# Patient Record
Sex: Female | Born: 1945
Health system: Southern US, Community
[De-identification: ages and names within clinical notes are randomized; demographics above are authoritative.]

## PROBLEM LIST (undated history)

## (undated) DIAGNOSIS — M1990 Unspecified osteoarthritis, unspecified site: Secondary | ICD-10-CM

## (undated) DIAGNOSIS — K219 Gastro-esophageal reflux disease without esophagitis: Secondary | ICD-10-CM

## (undated) DIAGNOSIS — E785 Hyperlipidemia, unspecified: Secondary | ICD-10-CM

## (undated) DIAGNOSIS — I10 Essential (primary) hypertension: Secondary | ICD-10-CM

## (undated) HISTORY — PX: REPLACEMENT TOTAL HIP W/  RESURFACING IMPLANTS: SUR1222

## (undated) HISTORY — DX: Hyperlipidemia, unspecified: E78.5

## (undated) HISTORY — DX: Essential (primary) hypertension: I10

## (undated) HISTORY — DX: Gastro-esophageal reflux disease without esophagitis: K21.9

## (undated) HISTORY — DX: Unspecified osteoarthritis, unspecified site: M19.90

---

## 1996-12-27 HISTORY — PX: CHOLECYSTECTOMY: SHX55

## 2001-07-14 ENCOUNTER — Ambulatory Visit (HOSPITAL_COMMUNITY): Admission: RE | Admit: 2001-07-14 | Discharge: 2001-07-14 | Payer: Self-pay | Admitting: Orthopaedic Surgery

## 2001-07-14 ENCOUNTER — Encounter: Payer: Self-pay | Admitting: Orthopaedic Surgery

## 2012-09-08 HISTORY — PX: COLONOSCOPY: SHX174

## 2012-10-30 HISTORY — PX: COLON SURGERY: SHX602

## 2012-11-26 DIAGNOSIS — C20 Malignant neoplasm of rectum: Secondary | ICD-10-CM

## 2012-11-26 HISTORY — DX: Malignant neoplasm of rectum: C20

## 2013-07-04 HISTORY — PX: REPLACEMENT TOTAL KNEE: SUR1224

## 2013-07-31 ENCOUNTER — Inpatient Hospital Stay
Admission: RE | Admit: 2013-07-31 | Discharge: 2013-07-31 | Disposition: A | Discharge status: Home / Self Care | Payer: Self-pay | Source: Ambulatory Visit | Admitting: Radiation Oncology

## 2013-07-31 ENCOUNTER — Ambulatory Visit: Payer: Self-pay | Admitting: Radiation Oncology

## 2013-07-31 ENCOUNTER — Ambulatory Visit: Payer: Self-pay

## 2013-07-31 ENCOUNTER — Other Ambulatory Visit: Payer: Self-pay | Admitting: Radiation Therapy

## 2013-07-31 DIAGNOSIS — C7949 Secondary malignant neoplasm of other parts of nervous system: Secondary | ICD-10-CM

## 2013-07-31 DIAGNOSIS — C7931 Secondary malignant neoplasm of brain: Secondary | ICD-10-CM

## 2013-08-06 ENCOUNTER — Encounter: Payer: Self-pay | Admitting: *Deleted

## 2013-08-06 ENCOUNTER — Telehealth: Payer: Self-pay | Admitting: *Deleted

## 2013-09-03 ENCOUNTER — Telehealth: Payer: Self-pay | Admitting: *Deleted

## 2013-09-03 NOTE — Telephone Encounter (Signed)
Pt states she does not have cancer and is being see at [facility] for knee surgery

## 2013-10-24 HISTORY — PX: COLONOSCOPY: SHX174

## 2014-01-01 ENCOUNTER — Encounter (HOSPITAL_COMMUNITY): Payer: Self-pay

## 2014-12-03 HISTORY — PX: REPLACEMENT TOTAL KNEE: SUR1224

## 2014-12-27 DIAGNOSIS — K5732 Diverticulitis of large intestine without perforation or abscess without bleeding: Secondary | ICD-10-CM

## 2014-12-27 HISTORY — DX: Diverticulitis of large intestine without perforation or abscess without bleeding: K57.32

## 2018-04-26 HISTORY — PX: TOTAL SHOULDER ARTHROPLASTY: SHX126

## 2020-03-06 ENCOUNTER — Encounter: Payer: Self-pay | Admitting: Internal Medicine

## 2020-03-22 ENCOUNTER — Encounter: Payer: Self-pay | Admitting: Gastroenterology

## 2020-03-22 NOTE — Progress Notes (Signed)
Referring Provider: Roderic Scarce, MD Primary Care Physician:  Roderic Scarce, MD Primary Gastroenterologist:  Dr. Gala Romney  Chief Complaint  Patient presents with  . Consult    tcs was last done 5 years ago  . Diarrhea    daily- got covid last year and had severe diarrhea afterwards and has been going on since  . Abdominal Pain    none now    HPI:   Brenda Mclean is a 74 y.o. female presenting today at the request of Roderic Scarce, MD for consult colonoscopy, diarrhea, and abdominal pain.   Today:  Last colonoscopy was about 5 years ago in [place] with Dr. West Carbo. Doesn't remember how often she was supposed to have a colonoscopy. States last colonoscopy was "clear" but she has had polyps. Reports a colon mass in the past and a surgery to remove the mass.  No radiation or chemotherapy.  Surgeon was Jasmine December in [place], [place] at [facility]. About 5-6 years before last TCS. Also reports prior colonoscopy at [facility].   Diarrhea started at time COVID.  Minimal improvement.  Initially was having 7 BMs daily.  Now having about 5 BMs daily. No nocturnal BMs. Stools are watery. No brbpr or melena.  Does not think she was recently on antibiotics. None prior to COVID. No associated abdominal pain.  States when she had Covid, she had mild lower abdominal pain but this has resolved. BMs come quickly after eating. Drinking milk daily, cheese, ice cream, and yogurt regularly. Also eating fried/fatty foods. No gallbladder. Prior to [place], she would have occasional loose. Was in the hospital for 8 days. Drinks well water. No livestock contact. Not taking anything to help with diarrhea. Reports having C. Diff years ago.   No fever, chills, cold or flu like symptoms. Feels she has had some memory trouble since COVID. Feels like this is improving somewhat. No lightheadedness, dizziness. No chest pian or heart palpitations. No shortness of breath or cough. Reports losing about 20 pounds with  COVID-19 but weight has stabilized and reports gaining about 3 pounds back.  No NSAIDs.   GERD is well controlled. No nausea or vomiting. No dysphagia.    Past Medical History:  Diagnosis Date  . Arthritis   . COVID-19 03/2019  . GERD (gastroesophageal reflux disease)   . HLD (hyperlipidemia)   . HTN (hypertension)   . 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 Sigmoid diverticulitis 0762   Uncomplicated    Past Surgical History:  Procedure Laterality Date  . CHOLECYSTECTOMY  1998  . COLON SURGERY  2013   rectal mass removed-per patient   . REPLACEMENT TOTAL HIP W/  RESURFACING IMPLANTS Bilateral   . 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 REPLACEMENT TOTAL KNEE Right 12/03/2014  . TOTAL SHOULDER ARTHROPLASTY  04/26/2018    Current Outpatient Medications  Medication Sig Dispense Refill  . celecoxib (CELEBREX) 200 MG capsule Take 200 mg by mouth daily.    . metoprolol tartrate (LOPRESSOR) 25 MG tablet Take 25 mg by mouth daily.    Marland Kitchen omeprazole (PRILOSEC) 20 MG capsule Take 20 mg by mouth daily.    . simvastatin (ZOCOR) 40 MG tablet Take 40 mg by mouth daily.    . valsartan (DIOVAN) 320 MG tablet Take 320 mg by mouth daily.     No current facility-administered medications for this visit.    Allergies as of 03/24/2020  . (No Known Allergies)    Family History  Problem Relation  Age of Onset  . Colon cancer Neg Hx     Social History   Socioeconomic History  . Marital status: Single    Spouse name: Not on file  . Number of children: Not on file  . Years of education: Not on file  . Highest education level: Not on file  Occupational History  . Not on file  Tobacco Use  . Smoking status: Never Smoker  . Smokeless tobacco: Never Used  Substance and Sexual Activity  . Alcohol use: Never  . Drug use: Never  . Sexual activity: Not on file  Other Topics Concern  . Not on file  Social History Narrative  . Not on file   Social Determinants of Health    Financial Resource Strain:   . Difficulty of Paying Living Expenses:   Food Insecurity:   . Worried About Charity fundraiser in the Last Year:   . Arboriculturist in the Last Year:   Transportation Needs:   . Film/video editor (Medical):   Marland Kitchen Lack of Transportation (Non-Medical):   Physical Activity:   . Days of Exercise per Week:   . Minutes of Exercise per Session:   Stress:   . Feeling of Stress :   Social Connections:   . Frequency of Communication with Friends and Family:   . Frequency of Social Gatherings with Friends and Family:   . Attends Religious Services:   . Active Member of Clubs or Organizations:   . Attends Archivist Meetings:   Marland Kitchen Marital Status:   Intimate Partner Violence:   . Fear of Current or Ex-Partner:   . Emotionally Abused:   Marland Kitchen Physically Abused:   . Sexually Abused:     Review of Systems: Gen: See HPI CV: See HPI Resp: See HPI GI: See HPI GU : Denies urinary burning, urinary frequency, urinary hesitancy MS: Has had 5 joint replacements.  No joint pain. Derm: Denies rash Psych: Denies depression or anxiety Heme: Denies bruising or bleeding  Physical Exam: BP (!) 150/81   Pulse 80   Temp (!) 97.1 F (36.2 C) (Oral)   Ht 5' 1"  (1.549 m)   Wt 175 lb 12.8 oz (79.7 kg)   BMI 33.22 kg/m  General:   Alert and oriented. Pleasant and cooperative. Well-nourished and well-developed.  Head:  Normocephalic and atraumatic. Eyes:  Without icterus, sclera clear and conjunctiva pink.  Ears:  Normal auditory acuity. Lungs:  Clear to auscultation bilaterally. No wheezes, rales, or rhonchi. No distress.  Heart:  S1, S2 present without murmurs appreciated.  Abdomen:  +BS, soft, non-tender and non-distended. No HSM noted. No guarding or rebound. No masses appreciated.  Rectal:  Deferred  Msk:  Symmetrical without gross deformities. Normal posture. Extremities:  Without edema. Neurologic:  Alert and  oriented x4;  grossly normal  neurologically. Skin:  Intact without significant lesions or rashes. Psych: Normal mood and affect.  Labs: 12/06/2019 CMP: Glucose 93, creatinine 0.95, GFR 59 (L), sodium 145 (H), potassium 4.3, chloride 105, calcium 9.5, albumin 4.5, total bilirubin 0.5, alk phos 94, AST 26, ALT 20 Lipid panel: Total cholesterol 206 (H), triglycerides 178 (H), HDL 69, LDL 107 (H)  08/15/2019 TSH 3.570 Free T4 0.92 CBC: WBC 6.8, hemoglobin 15.1, MCV 91, MCH 29.8, MCHC 32.7, platelets 260 CMP: Glucose 87, creatinine 0.97, GFR 57 (L), sodium 142, potassium 4.7, chloride 103, calcium 10, albumin 4.7, total bilirubin 0.5, alk phos 79, AST 28, ALT 21 Lipid panel: Total  cholesterol 181, triglycerides 155 (H), HDL 72, LDL 78

## 2020-03-24 ENCOUNTER — Ambulatory Visit (INDEPENDENT_AMBULATORY_CARE_PROVIDER_SITE_OTHER): Payer: Medicare Other | Admitting: Gastroenterology

## 2020-03-24 ENCOUNTER — Other Ambulatory Visit: Payer: Self-pay

## 2020-03-24 ENCOUNTER — Encounter: Payer: Self-pay | Admitting: Gastroenterology

## 2020-03-24 VITALS — BP 150/81 | HR 80 | Temp 97.1°F | Ht 61.0 in | Wt 175.8 lb

## 2020-03-24 DIAGNOSIS — Z860100 Personal history of colon polyps, unspecified: Secondary | ICD-10-CM

## 2020-03-24 DIAGNOSIS — R197 Diarrhea, unspecified: Secondary | ICD-10-CM | POA: Diagnosis not present

## 2020-03-24 DIAGNOSIS — Z85048 Personal history of other malignant neoplasm of rectum, rectosigmoid junction, and anus: Secondary | ICD-10-CM | POA: Diagnosis not present

## 2020-03-24 NOTE — Assessment & Plan Note (Addendum)
74 year old female with history significant for rectal adenocarcinoma s/p resection in 2013 (without chemotherapy or radiation per patient), colon polyps, last colonoscopy per patient about 5 years ago in [place] with Dr. West Carbo presenting to discuss scheduling her surveillance colonoscopy and watery diarrhea that started at the time of COVID-19 diagnosis in April 2020.  Patient was admitted to the hospital for 8 days with Covid.  Since then, she has had resolution of all respiratory symptoms but has continued with memory trouble and frequent watery diarrhea with very minimal improvement.  Denies abdominal pain, bright red blood per rectum, or melena.  Weight loss when sick with Covid but weight is currently stable. She is not currently taking anything for diarrhea and does note she consumes dairy products regularly.  She is also s/p cholecystectomy and does consume fried/fatty foods at times.  Denies recent antibiotics or contact with livestock.  She does drink well water.  Recent labs in August 2020 with hemoglobin 15.1, TSH 3.57. CMP in December 2020 essentially normal except for slight decrease in GFR (59) with creatinine 0.95.  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  With recent hospitalization and well water consumption, cannot rule out infectious diarrhea.  Suspect diet may also be playing a role.   Complete C. difficile and GI pathogen stool studies.  Advise she follow a lactose-free diet or take Lactaid tablets prior to consumption of any dairy products. Avoid fried/fatty foods.  Request colonoscopy records as well as prior surgical report to determine timing of next colonoscopy. Further recommendations to follow stool study results and review of prior records.

## 2020-03-24 NOTE — Assessment & Plan Note (Signed)
Addressed under history of colonic polyps.  

## 2020-03-24 NOTE — Assessment & Plan Note (Addendum)
74 year old female with history significant for rectal adenocarcinoma s/p resection in 2013 (without chemotherapy or radiation per patient), colon polyps, last colonoscopy per patient about 5 years ago in [place] with Dr. West Carbo. She states that colonoscopy was "clear."  She is currently dealing with persistent watery diarrhea with about 5 stools daily that started at the time of COVID-19 diagnosis in April 2020 only with minimal improvement since then.  Diarrhea as discussed above.  Denies bright red blood per rectum, melena, or abdominal pain.  Reports about 20 pound weight loss with COVID-19 but weight has stabilized and she has gained about 3 pounds back.  No family history of colon cancer.  As it is unclear exactly when patient had her last colonoscopy, I will request her prior colonoscopy and surgical records prior to scheduling.  Further recommendations to follow review of records.  Further evaluation management of diarrhea as per above.  Addendum:  Received and reviewed prior colonoscopy and surgical records.  See telephone note dated 04/01/2020 for complete details.  Patient had 2 cm polyp in the distal rectum surgically removed in November 2013 with pathology revealing superficially invasive moderately differentiated adenocarcinoma arising in adenomatous polyp of the distal rectum.  Follow-up colonoscopy with polyp mid rectum.  No pathology received but recommendations were to repeat colonoscopy in 5 years.  She is currently overdue for colonoscopy and we will get this arranged.  Proceed with TCS with propofol with Dr. Gala Romney in the near future.  We had discussed this at her prior office visit. We will opt to use propofol as propofol has been used on her last 2 colonoscopies. Follow-up after colonoscopy.

## 2020-03-24 NOTE — Patient Instructions (Signed)
Please have stool studies completed for [facility].  Please have the results faxed back to our office.  We will call you with results and further recommendations.  Please follow a lactose-free diet or take Lactaid pills prior to consuming any dairy products.  You should also follow a low-fat diet as you do not have a gallbladder and this may be influencing her diarrhea.  I am requesting your previous colonoscopy and colon surgery records.  I will call you regarding whether or not it is time to update your colonoscopy.  Aliene Altes, PA-C [facility]

## 2020-04-01 ENCOUNTER — Telehealth: Payer: Self-pay | Admitting: Gastroenterology

## 2020-04-01 ENCOUNTER — Encounter: Payer: Self-pay | Admitting: Gastroenterology

## 2020-04-01 LAB — GI PROFILE, STOOL, PCR

## 2020-04-01 LAB — OVA AND PARASITE EXAMINATION

## 2020-04-01 LAB — CLOSTRIDIUM DIFFICILE EIA: C difficile Toxins A+B, EIA: NEGATIVE

## 2020-04-01 NOTE — Telephone Encounter (Signed)
Received and reviewed colonoscopy reports from 2013 and 2014 as well as surgical report for removal of distal rectal polyp in 2013 as detailed below.  -Colonoscopy with propofol 09/08/2012 with Dr. West Carbo for colon cancer screening. Findings: Multilobulated 3 mm mass distal rectum with multiple biopsies taken, scattered diverticulosis in the sigmoid colon, otherwise normal exam. Pathology: Adenomatous polyp  -Transanal resection of rectal polyp and exam under general anesthesia on 10/30/2012 with Dr. Herschell Dimes,  Findings: At the distal rectum, at the right anterolateral rectal wall, just a few centimeters proximal to be dentate line, there was approximately a 2 cm polyp on a slightly more narrow base.  The polyp was soft.  No other abnormality noted on anorectal exam.  The polyp was removed via stapler across the base.  There was still one small attachment which was further resected. Pathology: Superficially invasive moderately differentiated adenocarcinoma arising in adenomatous polyp of distal rectum.  -Colonoscopy with propofol 10/24/2013 for follow-up with Dr. West Carbo.  Findings: Small granuloma and sutures distal rectum, small polyp mid rectum removed, sigmoid diverticulosis, otherwise normal exam.  Recommended follow-up colonoscopy in 5 years unless pathology revealed dysplasia. No pathology received.   I will have these reports scanned into patient's chart. She is due for repeat TCS at this time.   Elmo Putt, please let patient know I have received and reviewed her last colonoscopy report.  Her last colonoscopy was in 2014.  She did have 1 polyp removed and it was recommended for her to have a repeat colonoscopy in 5 years.  We need to proceed with scheduling her colonoscopy as discussed at the time of office visit.   [facility] Clinical Pool:  Please arrange TCS with propofol with RMR. Dx: history of adenomatous colon polyps, history of adenocarcinoma of distal rectum, diarrhea.

## 2020-04-01 NOTE — Progress Notes (Signed)
Stool studies without any evidence of infectious diarrhea. Please follow-up with patient to see how her diarrhea is doing. I had recommended lactose free diet or lactaid pills prior to all dairy and following a low fat diet.   If she continues with diarrhea, recommend taking imodium as needed. May take 2 tablets after first loose stool then 1 after each additional loose stool but no more than 4 tablets in 24 hours. She should call us back in 1 week with an update.   We are waiting on colonoscopy records to determine timing of next colonoscopy.   Manuela Schwartz, have we requested colonoscopy records from Dr. West Carbo  and colon surgery report from [facility] in [place], [place] at [facility]?

## 2020-04-02 ENCOUNTER — Encounter: Payer: Self-pay | Admitting: *Deleted

## 2020-04-02 MED ORDER — NA SULFATE-K SULFATE-MG SULF 17.5-3.13-1.6 GM/177ML PO SOLN: 1.0000 | Freq: Once | ORAL | 0 refills | Status: AC

## 2020-04-02 NOTE — Telephone Encounter (Signed)
Reminder in epic °

## 2020-04-02 NOTE — Telephone Encounter (Signed)
Spoke with patient. She is aware [place] review reports and recs tcs with propofol with RMR. She is agreeable.  Procedure scheduled for 7/15 at 10:00am. Patient aware will mail prep instructions with pre-op/covid test appt, confirmed mailing address. Confirmed pharmacy is [facility] for prep rx to be sent to. She will pick this up.

## 2020-04-02 NOTE — Telephone Encounter (Signed)
NIC Repeat TCS in 5 years

## 2020-04-02 NOTE — Telephone Encounter (Signed)
Noted  

## 2020-04-02 NOTE — Addendum Note (Signed)
Addended by: Cheron Every on: 04/02/2020 08:28 AM   Modules accepted: Orders

## 2020-06-19 ENCOUNTER — Telehealth: Payer: Self-pay | Admitting: *Deleted

## 2020-06-19 MED ORDER — PEG 3350-KCL-NA BICARB-NACL 420 G PO SOLR: ORAL | 0 refills | Status: DC

## 2020-06-19 NOTE — Telephone Encounter (Signed)
Patient called in requesting golytly as she has had this in the past. Advised when she was scheduled it was on back order. She states she just got off the phone with her pharmacy and they do have it now. I advised will send in Rx and mail her new prep instructions. She then stated "I have done this 3 times, I know what I am doing". I advised will mail her prep instructions as this is a slit prep and it will give exact directions on what to do for prep.

## 2020-07-04 NOTE — Patient Instructions (Signed)
Brenda Mclean  07/04/2020     @PREFPERIOPPHARMACY @   Your procedure is scheduled on  07/10/2020   Report to [facility] at [place].M.  Call this number if you have problems the morning of surgery:  [phone]   Remember:  Follow the diet and prep instructions given to you by Dr Roseanne Kaufman office.                      Take these medicines the morning of surgery with A SIP OF WATER  Celebrex,metoprolol, prilosec.    Do not wear jewelry, make-up or nail polish.  Do not wear lotions, powders, or perfumes. Please wear deodorant and brush your teeth.  Do not shave 48 hours prior to surgery.  Men may shave face and neck.  Do not bring valuables to the hospital.  [facility] is not responsible for any belongings or valuables.  Contacts, dentures or bridgework may not be worn into surgery.  Leave your suitcase in the car.  After surgery it may be brought to your room.  For patients admitted to the hospital, discharge time will be determined by your treatment team.  Patients discharged the day of surgery will not be allowed to drive home.   Name and phone number of your driver:   family Special instructions:  DO NOT smoke the morning of your procedure.  Please read over the following fact sheets that you were given. Anesthesia Post-op Instructions and Care and Recovery After Surgery       Colonoscopy, Adult, Care After This sheet gives you information about how to care for yourself after your procedure. Your health care provider may also give you more specific instructions. If you have problems or questions, contact your health care provider. What can I expect after the procedure? After the procedure, it is common to have:  A small amount of blood in your stool for 24 hours after the procedure.  Some gas.  Mild cramping or bloating of your abdomen. Follow these instructions at home: Eating and drinking   Drink enough fluid to keep your urine pale yellow.  Follow  instructions from your health care provider about eating or drinking restrictions.  Resume your normal diet as instructed by your health care provider. Avoid heavy or fried foods that are hard to digest. Activity  Rest as told by your health care provider.  Avoid sitting for a long time without moving. Get up to take short walks every 1-2 hours. This is important to improve blood flow and breathing. Ask for help if you feel weak or unsteady.  Return to your normal activities as told by your health care provider. Ask your health care provider what activities are safe for you. Managing cramping and bloating   Try walking around when you have cramps or feel bloated.  Apply heat to your abdomen as told by your health care provider. Use the heat source that your health care provider recommends, such as a moist heat pack or a heating pad. ? Place a towel between your skin and the heat source. ? Leave the heat on for 20-30 minutes. ? Remove the heat if your skin turns bright red. This is especially important if you are unable to feel pain, heat, or cold. You may have a greater risk of getting burned. General instructions  For the first 24 hours after the procedure: ? Do not drive or use machinery. ? Do not sign  important documents. ? Do not drink alcohol. ? Do your regular daily activities at a slower pace than normal. ? Eat soft foods that are easy to digest.  Take over-the-counter and prescription medicines only as told by your health care provider.  Keep all follow-up visits as told by your health care provider. This is important. Contact a health care provider if:  You have blood in your stool 2-3 days after the procedure. Get help right away if you have:  More than a small spotting of blood in your stool.  Large blood clots in your stool.  Swelling of your abdomen.  Nausea or vomiting.  A fever.  Increasing pain in your abdomen that is not relieved with  medicine. Summary  After the procedure, it is common to have a small amount of blood in your stool. You may also have mild cramping and bloating of your abdomen.  For the first 24 hours after the procedure, do not drive or use machinery, sign important documents, or drink alcohol.  Get help right away if you have a lot of blood in your stool, nausea or vomiting, a fever, or increased pain in your abdomen. This information is not intended to replace advice given to you by your health care provider. Make sure you discuss any questions you have with your health care provider. Document Revised: 07/09/2019 Document Reviewed: 07/09/2019 Elsevier Patient Education  [place] These instructions provide you with information about caring for yourself after your procedure. Your health care provider may also give you more specific instructions. Your treatment has been planned according to current medical practices, but problems sometimes occur. Call your health care provider if you have any problems or questions after your procedure. What can I expect after the procedure? After your procedure, you may:  Feel sleepy for several hours.  Feel clumsy and have poor balance for several hours.  Feel forgetful about what happened after the procedure.  Have poor judgment for several hours.  Feel nauseous or vomit.  Have a sore throat if you had a breathing tube during the procedure. Follow these instructions at home: For at least 24 hours after the procedure:      Have a responsible adult stay with you. It is important to have someone help care for you until you are awake and alert.  Rest as needed.  Do not: ? Participate in activities in which you could fall or become injured. ? Drive. ? Use heavy machinery. ? Drink alcohol. ? Take sleeping pills or medicines that cause drowsiness. ? Make important decisions or sign legal documents. ? Take care  of children on your own. Eating and drinking  Follow the diet that is recommended by your health care provider.  If you vomit, drink water, juice, or soup when you can drink without vomiting.  Make sure you have little or no nausea before eating solid foods. General instructions  Take over-the-counter and prescription medicines only as told by your health care provider.  If you have sleep apnea, surgery and certain medicines can increase your risk for breathing problems. Follow instructions from your health care provider about wearing your sleep device: ? Anytime you are sleeping, including during daytime naps. ? While taking prescription pain medicines, sleeping medicines, or medicines that make you drowsy.  If you smoke, do not smoke without supervision.  Keep all follow-up visits as told by your health care provider. This is important. Contact a health care provider if:  You keep feeling nauseous or you keep vomiting.  You feel light-headed.  You develop a rash.  You have a fever. Get help right away if:  You have trouble breathing. Summary  For several hours after your procedure, you may feel sleepy and have poor judgment.  Have a responsible adult stay with you for at least 24 hours or until you are awake and alert. This information is not intended to replace advice given to you by your health care provider. Make sure you discuss any questions you have with your health care provider. Document Revised: 03/13/2018 Document Reviewed: 04/04/2016 Elsevier Patient Education  [place].

## 2020-07-07 ENCOUNTER — Other Ambulatory Visit (HOSPITAL_COMMUNITY)
Admission: RE | Admit: 2020-07-07 | Discharge: 2020-07-07 | Disposition: A | Discharge status: Home / Self Care | Payer: Medicare Other | Source: Ambulatory Visit | Attending: Internal Medicine | Admitting: Internal Medicine

## 2020-07-07 ENCOUNTER — Encounter (HOSPITAL_COMMUNITY)
Admission: RE | Admit: 2020-07-07 | Discharge: 2020-07-07 | Disposition: A | Discharge status: Home / Self Care | Payer: Medicare Other | Source: Ambulatory Visit | Attending: Internal Medicine | Admitting: Internal Medicine

## 2020-07-07 ENCOUNTER — Other Ambulatory Visit: Payer: Self-pay

## 2020-07-07 ENCOUNTER — Encounter (HOSPITAL_COMMUNITY): Payer: Self-pay

## 2020-07-07 DIAGNOSIS — Z01812 Encounter for preprocedural laboratory examination: Secondary | ICD-10-CM | POA: Insufficient documentation

## 2020-07-07 DIAGNOSIS — Z20822 Contact with and (suspected) exposure to covid-19: Secondary | ICD-10-CM | POA: Insufficient documentation

## 2020-07-08 LAB — SARS CORONAVIRUS 2 (TAT 6-24 HRS): SARS Coronavirus 2: NEGATIVE

## 2020-07-10 ENCOUNTER — Ambulatory Visit (HOSPITAL_COMMUNITY): Payer: Medicare Other | Admitting: Anesthesiology

## 2020-07-10 ENCOUNTER — Encounter (HOSPITAL_COMMUNITY): Payer: Self-pay | Admitting: Internal Medicine

## 2020-07-10 ENCOUNTER — Encounter (HOSPITAL_COMMUNITY)
Admission: RE | Disposition: A | Discharge status: Home / Self Care | Payer: Self-pay | Source: Home / Self Care | Attending: Internal Medicine

## 2020-07-10 ENCOUNTER — Other Ambulatory Visit: Payer: Self-pay

## 2020-07-10 ENCOUNTER — Ambulatory Visit (HOSPITAL_COMMUNITY)
Admission: RE | Admit: 2020-07-10 | Discharge: 2020-07-10 | Disposition: A | Discharge status: Home / Self Care | Payer: Medicare Other | Attending: Internal Medicine | Admitting: Internal Medicine

## 2020-07-10 DIAGNOSIS — Z8616 Personal history of COVID-19: Secondary | ICD-10-CM | POA: Insufficient documentation

## 2020-07-10 DIAGNOSIS — Z96643 Presence of artificial hip joint, bilateral: Secondary | ICD-10-CM | POA: Diagnosis not present

## 2020-07-10 DIAGNOSIS — I10 Essential (primary) hypertension: Secondary | ICD-10-CM | POA: Insufficient documentation

## 2020-07-10 DIAGNOSIS — C20 Malignant neoplasm of rectum: Secondary | ICD-10-CM | POA: Diagnosis not present

## 2020-07-10 DIAGNOSIS — Z86010 Personal history of colonic polyps: Secondary | ICD-10-CM | POA: Insufficient documentation

## 2020-07-10 DIAGNOSIS — D123 Benign neoplasm of transverse colon: Secondary | ICD-10-CM | POA: Diagnosis not present

## 2020-07-10 DIAGNOSIS — Z85048 Personal history of other malignant neoplasm of rectum, rectosigmoid junction, and anus: Secondary | ICD-10-CM | POA: Insufficient documentation

## 2020-07-10 DIAGNOSIS — K5730 Diverticulosis of large intestine without perforation or abscess without bleeding: Secondary | ICD-10-CM | POA: Diagnosis not present

## 2020-07-10 DIAGNOSIS — Z79899 Other long term (current) drug therapy: Secondary | ICD-10-CM | POA: Insufficient documentation

## 2020-07-10 DIAGNOSIS — Z96653 Presence of artificial knee joint, bilateral: Secondary | ICD-10-CM | POA: Diagnosis not present

## 2020-07-10 DIAGNOSIS — K219 Gastro-esophageal reflux disease without esophagitis: Secondary | ICD-10-CM | POA: Insufficient documentation

## 2020-07-10 DIAGNOSIS — K635 Polyp of colon: Secondary | ICD-10-CM

## 2020-07-10 DIAGNOSIS — K529 Noninfective gastroenteritis and colitis, unspecified: Secondary | ICD-10-CM | POA: Diagnosis not present

## 2020-07-10 DIAGNOSIS — Z791 Long term (current) use of non-steroidal anti-inflammatories (NSAID): Secondary | ICD-10-CM | POA: Diagnosis not present

## 2020-07-10 DIAGNOSIS — Z96619 Presence of unspecified artificial shoulder joint: Secondary | ICD-10-CM | POA: Diagnosis not present

## 2020-07-10 HISTORY — PX: BIOPSY: SHX5522

## 2020-07-10 HISTORY — PX: POLYPECTOMY: SHX5525

## 2020-07-10 HISTORY — PX: COLONOSCOPY WITH PROPOFOL: SHX5780

## 2020-07-10 SURGERY — COLONOSCOPY WITH PROPOFOL
Anesthesia: General

## 2020-07-10 MED ORDER — CHLORHEXIDINE GLUCONATE CLOTH 2 % EX PADS: 6.0000 | MEDICATED_PAD | Freq: Once | CUTANEOUS | Status: DC

## 2020-07-10 MED ORDER — LACTATED RINGERS IV SOLN: INTRAVENOUS | Status: DC

## 2020-07-10 MED ORDER — PROPOFOL 500 MG/50ML IV EMUL
INTRAVENOUS | Status: DC | PRN
  Administered 2020-07-10: 150 ug/kg/min via INTRAVENOUS

## 2020-07-10 MED ORDER — PROPOFOL 10 MG/ML IV BOLUS
INTRAVENOUS | Status: AC
  Filled 2020-07-10: qty 40

## 2020-07-10 MED ORDER — LACTATED RINGERS IV SOLN: INTRAVENOUS | Status: DC | PRN

## 2020-07-10 NOTE — Op Note (Addendum)
[facility] Patient Name: Brenda Mclean Procedure Date: 07/10/2020 9:00 AM MRN: [id number] Date of Birth: September 02, 1946 Attending MD: Norvel Richards , MD CSN: [id number] Age: 74 Admit Type: Outpatient Procedure:                Colonoscopy Indications:              High risk colon cancer surveillance: Personal                            history of colonic polyps Providers:                Norvel Richards, MD, Tammy Vaught, RN, Crystal                            Page, Randa Spike, Technician Referring MD:              Medicines:                Propofol per Anesthesia Complications:            No immediate complications. Estimated Blood Loss:     Estimated blood loss was minimal. Procedure:                Pre-Anesthesia Assessment:                           - Prior to the procedure, a History and Physical                            was performed, and patient medications and                            allergies were reviewed. The patient's tolerance of                            previous anesthesia was also reviewed. The risks                            and benefits of the procedure and the sedation                            options and risks were discussed with the patient.                            All questions were answered, and informed consent                            was obtained. Prior Anticoagulants: The patient has                            taken no previous anticoagulant or antiplatelet                            agents. ASA Grade Assessment: II - A patient with  mild systemic disease. After reviewing the risks                            and benefits, the patient was deemed in                            satisfactory condition to undergo the procedure.                           After obtaining informed consent, the colonoscope                            was passed under direct vision. Throughout the                             procedure, the patient's blood pressure, pulse, and                            oxygen saturations were monitored continuously. The                            CF-[id number] ([id number]) scope was introduced through                            the anus and advanced to the the cecum, identified                            by appendiceal orifice and ileocecal valve. The                            colonoscopy was performed without difficulty. The                            patient tolerated the procedure well. The quality                            of the bowel preparation was adequate. The entire                            colon was well visualized. Scope In: 10:08:01 AM Scope Out: 10:30:43 AM Scope Withdrawal Time: 0 hours 14 minutes 29 seconds  Total Procedure Duration: 0 hours 22 minutes 42 seconds  Findings:      The perianal and digital rectal examinations revealed a rounded freely       movable depressed lesion at the very tip of the examiners index finger,       2 to 3 cm in dimensions..      Scattered small and large-mouthed diverticula were found in the entire       colon.      A 5 mm polyp was found in the colon. The polyp was sessile. The polyp       was removed with a cold snare. Resection and retrieval were complete.       Estimated blood loss was minimal. Segmental biopsies of the right and  left colon were taken for histologic study to evaluate chronic diarrhea      In the rectum, at approximately 8 cm from the anal verge, there was a       2.5 cm centrally depressed, ulcerated lesion with a heaped up circular       margin. Please see photos. Distally, just above the anal verge, there       was a suture line consistent with history of prior transanal       polypectomy. The lesion at 8 cm was biopsied for histologic study. Impression:               - Diverticulosis in the entire examined colon.                           - One 5 mm polyp, removed with a cold snare.                             Resected and retrieved. 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 Status post                            biopsy.                           Evidence of prior distal rectal surgery as outlined                            above. S/P post colon segmental biopsy Moderate Sedation:      Moderate (conscious) sedation was personally administered by an       anesthesia professional. The following parameters were monitored: oxygen       saturation, heart rate, blood pressure, respiratory rate, EKG, adequacy       of pulmonary ventilation, and response to care. Recommendation:           - Advance diet as tolerated. Procedure Code(s):        --- Professional ---                           [phone], Colonoscopy, flexible; with removal of                            tumor(s), polyp(s), or other lesion(s) by snare                            technique Diagnosis Code(s):        --- Professional ---                           Z86.010, Personal history of colonic polyps                           K63.5, Polyp of colon  K57.30, Diverticulosis of large intestine without                            perforation or abscess without bleeding CPT copyright 2019 American Medical Association. All rights reserved. The codes documented in this report are preliminary and upon coder review may  be revised to meet current compliance requirements. Cristopher Estimable. Emmaline Wahba, MD Norvel Richards, MD 07/10/2020 10:46:43 AM This report has been signed electronically. Number of Addenda: 0

## 2020-07-10 NOTE — H&P (Signed)
@LOGO @   Primary Care Physician:  Roderic Scarce, MD Primary Gastroenterologist:  Dr. Gala Romney  Pre-Procedure History & Physical: HPI:  Brenda Mclean is a 74 y.o. 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  Simple adenoma 2013.  Chronic diarrhea persist.  Stool studies negative.  Colonoscopy now being done for the above-mentioned reasons.  Past Medical History:  Diagnosis Date  . Arthritis   . COVID-19 03/2019  . GERD (gastroesophageal reflux disease)   . HLD (hyperlipidemia)   . HTN (hypertension)   . 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 Sigmoid diverticulitis 4580   Uncomplicated    Past Surgical History:  Procedure Laterality Date  . CHOLECYSTECTOMY  1998  . COLON SURGERY  10/30/2012   Dr. Audrie Lia; transanal resection of 2 cm rectal polyp; pathology revealed superficially invasive moderately differentiated adenocarcinoma arising in adenomatous polyp of distal rectum.  . COLONOSCOPY  10/24/2013   Dr. West Carbo; with propofol; small granuloma and sutures distal rectum, small polyp mid rectum removed, sigmoid diverticulosis, otherwise normal exam.  No pathology received.  Recommended colonoscopy in 5 years.  . COLONOSCOPY  09/08/2012   Dr. West Carbo; with propofol; multi lobulated friable 3 cm mass distal rectum s/p multiple biopsies, scattered sigmoid diverticulosis, otherwise normal exam.  Pathology with adenomatous polyp.  . REPLACEMENT TOTAL HIP W/  RESURFACING IMPLANTS Bilateral   . 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 REPLACEMENT TOTAL KNEE Right 12/03/2014  . TOTAL SHOULDER ARTHROPLASTY  04/26/2018    Prior to Admission medications   Medication Sig Start Date End Date Taking? Authorizing Provider  celecoxib (CELEBREX) 200 MG capsule Take 200 mg by mouth daily.   Yes [provider]  metoprolol succinate (TOPROL-XL) 25 MG 24 hr tablet  Take 25 mg by mouth daily.   Yes [provider]  Multiple Vitamin (MULTIVITAMIN WITH MINERALS) TABS tablet Take 1 tablet by mouth daily.   Yes [provider]  omeprazole (PRILOSEC) 20 MG capsule Take 20 mg by mouth daily.   Yes [provider]  simvastatin (ZOCOR) 40 MG tablet Take 40 mg by mouth at bedtime.    Yes [provider]  valsartan (DIOVAN) 320 MG tablet Take 320 mg by mouth daily.   Yes [provider]  metoprolol tartrate (LOPRESSOR) 25 MG tablet Take 25 mg by mouth daily. Patient not taking: Reported on 06/24/2020    [provider]  polyethylene glycol-electrolytes (NULYTELY) 420 g solution As directed 06/19/20   Ladene Allocca, Cristopher Estimable, MD    Allergies as of 04/02/2020  . (No Known Allergies)    Family History  Problem Relation Age of Onset  . Colon cancer Neg Hx     Social History   Socioeconomic History  . Marital status: Single    Spouse name: Not on file  . Number of children: Not on file  . Years of education: Not on file  . Highest education level: Not on file  Occupational History  . Not on file  Tobacco Use  . Smoking status: Never Smoker  . Smokeless tobacco: Never Used  Substance and Sexual Activity  . Alcohol use: Never  . Drug use: Never  . Sexual activity: Not on file  Other Topics Concern  . Not on file  Social History Narrative  . Not on file   Social Determinants of Health   Financial Resource Strain:   . Difficulty of Paying Living Expenses:  Food Insecurity:   . Worried About Charity fundraiser in the Last Year:   . Arboriculturist in the Last Year:   Transportation Needs:   . Film/video editor (Medical):   Marland Kitchen Lack of Transportation (Non-Medical):   Physical Activity:   . Days of Exercise per Week:   . Minutes of Exercise per Session:   Stress:   . Feeling of Stress :   Social Connections:   . Frequency of Communication with Friends and Family:   . Frequency of Social  Gatherings with Friends and Family:   . Attends Religious Services:   . Active Member of Clubs or Organizations:   . Attends Archivist Meetings:   Marland Kitchen Marital Status:   Intimate Partner Violence:   . Fear of Current or Ex-Partner:   . Emotionally Abused:   Marland Kitchen Physically Abused:   . Sexually Abused:     Review of Systems: See HPI, otherwise negative ROS  Physical Exam: BP (!) 148/71   Pulse 74   Temp 98.3 F (36.8 C) (Oral)   Resp 15   Ht 5\' 1"  (1.549 m)   Wt 77.1 kg   SpO2 95%   BMI 32.12 kg/m  General:   Alert,  Well-developed, well-nourished, pleasant and cooperative in NAD SNeck:  Supple; no masses or thyromegaly. No significant cervical adenopathy. Lungs:  Clear throughout to auscultation.   No wheezes, crackles, or rhonchi. No acute distress. Heart:  Regular rate and rhythm; no murmurs, clicks, rubs,  or gallops. Abdomen: Non-distended, normal bowel sounds.  Soft and nontender without appreciable mass or hepatosplenomegaly.  Pulses:  Normal pulses noted. Extremities:  Without clubbing or edema.  Impression/Plan: 74 year old lady here for surveillance colonoscopy.  Also chronic diarrhea. The risks, benefits, limitations, alternatives and imponderables have been reviewed with the patient. Questions have been answered. All parties are agreeable.      Notice: This dictation was prepared with Dragon dictation along with smaller phrase technology. Any transcriptional errors that result from this process are unintentional and may not be corrected upon review.

## 2020-07-10 NOTE — Anesthesia Postprocedure Evaluation (Signed)
Anesthesia Post Note  Patient: Brenda Mclean  Procedure(s) Performed: COLONOSCOPY WITH PROPOFOL (N/A ) BIOPSY POLYPECTOMY  Patient location during evaluation: PACU Anesthesia Type: General Level of consciousness: awake, oriented and patient cooperative Pain management: pain level controlled Vital Signs Assessment: post-procedure vital signs reviewed and stable Respiratory status: spontaneous breathing, respiratory function stable and nonlabored ventilation Cardiovascular status: stable Postop Assessment: no apparent nausea or vomiting Anesthetic complications: no   No complications documented.   Last Vitals:  Vitals:   07/10/20 0854  BP: (!) 148/71  Pulse: 74  Resp: 15  Temp: 36.8 C  SpO2: 95%    Last Pain:  Vitals:   07/10/20 1003  TempSrc:   PainSc: 0-No pain                 Pietra Zuluaga

## 2020-07-10 NOTE — Anesthesia Preprocedure Evaluation (Signed)
Anesthesia Evaluation  Patient identified by MRN, date of birth, ID band Patient awake    Reviewed: Allergy & Precautions, H&P , NPO status , Patient's Chart, lab work & pertinent test results, reviewed documented beta blocker date and time   Airway Mallampati: II  TM Distance: >3 FB Neck ROM: full    Dental no notable dental hx. (+) Teeth Intact   Pulmonary neg pulmonary ROS,    Pulmonary exam normal breath sounds clear to auscultation       Cardiovascular Exercise Tolerance: Good hypertension, negative cardio ROS   Rhythm:regular Rate:Normal     Neuro/Psych negative neurological ROS  negative psych ROS   GI/Hepatic Neg liver ROS, GERD  Medicated,  Endo/Other  negative endocrine ROS  Renal/GU negative Renal ROS  negative genitourinary   Musculoskeletal   Abdominal   Peds  Hematology negative hematology ROS (+)   Anesthesia Other Findings   Reproductive/Obstetrics negative OB ROS                             Anesthesia Physical Anesthesia Plan  ASA: II  Anesthesia Plan: General   Post-op Pain Management:    Induction:   PONV Risk Score and Plan: 3 and Propofol infusion  Airway Management Planned:   Additional Equipment:   Intra-op Plan:   Post-operative Plan:   Informed Consent: I have reviewed the patients History and Physical, chart, labs and discussed the procedure including the risks, benefits and alternatives for the proposed anesthesia with the patient or authorized representative who has indicated his/her understanding and acceptance.     Dental Advisory Given  Plan Discussed with: CRNA  Anesthesia Plan Comments:         Anesthesia Quick Evaluation

## 2020-07-10 NOTE — Discharge Instructions (Signed)
Colon Polyps  Polyps are tissue growths inside the body. Polyps can grow in many places, including the large intestine (colon). A polyp may be a round bump or a mushroom-shaped growth. You could have one polyp or several. Most colon polyps are noncancerous (benign). However, some colon polyps can become cancerous over time. Finding and removing the polyps early can help prevent this. What are the causes? The exact cause of colon polyps is not known. What increases the risk? You are more likely to develop this condition if you:  Have a family history of colon cancer or colon polyps.  Are older than 21 or older than 45 if you are African American.  Have inflammatory bowel disease, such as ulcerative colitis or Crohn's disease.  Have certain hereditary conditions, such as: ? Familial adenomatous polyposis. ? Lynch syndrome. ? Turcot syndrome. ? Peutz-Jeghers syndrome.  Are overweight.  Smoke cigarettes.  Do not get enough exercise.  Drink too much alcohol.  Eat a diet that is high in fat and red meat and low in fiber.  Had childhood cancer that was treated with abdominal radiation. What are the signs or symptoms? Most polyps do not cause symptoms. If you have symptoms, they may include:  Blood coming from your rectum when having a bowel movement.  Blood in your stool. The stool may look dark red or black.  Abdominal pain.  A change in bowel habits, such as constipation or diarrhea. How is this diagnosed? This condition is diagnosed with a colonoscopy. This is a procedure in which a lighted, flexible scope is inserted into the anus and then passed into the colon to examine the area. Polyps are sometimes found when a colonoscopy is done as part of routine cancer screening tests. How is this treated? Treatment for this condition involves removing any polyps that are found. Most polyps can be removed during a colonoscopy. Those polyps will then be tested for cancer.  Additional treatment may be needed depending on the results of testing. Follow these instructions at home: Lifestyle  Maintain a healthy weight, or lose weight if recommended by your health care provider.  Exercise every day or as told by your health care provider.  Do not use any products that contain nicotine or tobacco, such as cigarettes and e-cigarettes. If you need help quitting, ask your health care provider.  If you drink alcohol, limit how much you have: ? 0-1 drink a day for women. ? 0-2 drinks a day for men.  Be aware of how much alcohol is in your drink. In the [place] one drink equals one 12 oz bottle of beer (355 mL), one 5 oz glass of wine (148 mL), or one 1 oz shot of hard liquor (44 mL). Eating and drinking   Eat foods that are high in fiber, such as fruits, vegetables, and whole grains.  Eat foods that are high in calcium and vitamin D, such as milk, cheese, yogurt, eggs, liver, fish, and broccoli.  Limit foods that are high in fat, such as fried foods and desserts.  Limit the amount of red meat and processed meat you eat, such as hot dogs, sausage, bacon, and lunch meats. General instructions  Keep all follow-up visits as told by your health care provider. This is important. ? This includes having regularly scheduled colonoscopies. ? Talk to your health care provider about when you need a colonoscopy. Contact a health care provider if:  You have new or worsening bleeding during a bowel movement.  You  have new or increased blood in your stool.  You have a change in bowel habits.  You lose weight for no known reason. Summary  Polyps are tissue growths inside the body. Polyps can grow in many places, including the colon.  Most colon polyps are noncancerous (benign), but some can become cancerous over time.  This condition is diagnosed with a colonoscopy.  Treatment for this condition involves removing any polyps that are found. Most polyps can be removed  during a colonoscopy. This information is not intended to replace advice given to you by your health care provider. Make sure you discuss any questions you have with your health care provider. Document Revised: 03/30/2018 Document Reviewed: 03/30/2018 Elsevier Patient Education  [place] After These instructions provide you with information about caring for yourself after your procedure. Your health care provider may also give you more specific instructions. Your treatment has been planned according to current medical practices, but problems sometimes occur. Call your health care provider if you have any problems or questions after your procedure. What can I expect after the procedure? After your procedure, you may:  Feel sleepy for several hours.  Feel clumsy and have poor balance for several hours.  Feel forgetful about what happened after the procedure.  Have poor judgment for several hours.  Feel nauseous or vomit.  Have a sore throat if you had a breathing tube during the procedure. Follow these instructions at home: For at least 24 hours after the procedure:      Have a responsible adult stay with you. It is important to have someone help care for you until you are awake and alert.  Rest as needed.  Do not: ? Participate in activities in which you could fall or become injured. ? Drive. ? Use heavy machinery. ? Drink alcohol. ? Take sleeping pills or medicines that cause drowsiness. ? Make important decisions or sign legal documents. ? Take care of children on your own. Eating and drinking  Follow the diet that is recommended by your health care provider.  If you vomit, drink water, juice, or soup when you can drink without vomiting.  Make sure you have little or no nausea before eating solid foods. General instructions  Take over-the-counter and prescription medicines only as told by your health care provider.  If you  have sleep apnea, surgery and certain medicines can increase your risk for breathing problems. Follow instructions from your health care provider about wearing your sleep device: ? Anytime you are sleeping, including during daytime naps. ? While taking prescription pain medicines, sleeping medicines, or medicines that make you drowsy.  If you smoke, do not smoke without supervision.  Keep all follow-up visits as told by your health care provider. This is important. Contact a health care provider if:  You keep feeling nauseous or you keep vomiting.  You feel light-headed.  You develop a rash.  You have a fever. Get help right away if:  You have trouble breathing. Summary  For several hours after your procedure, you may feel sleepy and have poor judgment.  Have a responsible adult stay with you for at least 24 hours or until you are awake and alert. This information is not intended to replace advice given to you by your health care provider. Make sure you discuss any questions you have with your health care provider. Document Revised: 03/13/2018 Document Reviewed: 04/04/2016 Elsevier Patient Education  [place].  Colonoscopy Discharge Instructions  Read the instructions outlined below and refer to this sheet in the next few weeks. These discharge instructions provide you with general information on caring for yourself after you leave the hospital. Your doctor may also give you specific instructions. While your treatment has been planned according to the most current medical practices available, unavoidable complications occasionally occur. If you have any problems or questions after discharge, call Dr. Gala Romney at [phone]. ACTIVITY  You may resume your regular activity, but move at a slower pace for the next 24 hours.   Take frequent rest periods for the next 24 hours.   Walking will help get rid of the air and reduce the bloated feeling in your belly (abdomen).   No  driving for 24 hours (because of the medicine (anesthesia) used during the test).    Do not sign any important legal documents or operate any machinery for 24 hours (because of the anesthesia used during the test).  NUTRITION  Drink plenty of fluids.   You may resume your normal diet as instructed by your doctor.   Begin with a light meal and progress to your normal diet. Heavy or fried foods are harder to digest and may make you feel sick to your stomach (nauseated).   Avoid alcoholic beverages for 24 hours or as instructed.  MEDICATIONS  You may resume your normal medications unless your doctor tells you otherwise.  WHAT YOU CAN EXPECT TODAY  Some feelings of bloating in the abdomen.   Passage of more gas than usual.   Spotting of blood in your stool or on the toilet paper.  IF YOU HAD POLYPS REMOVED DURING THE COLONOSCOPY:  No aspirin products for 7 days or as instructed.   No alcohol for 7 days or as instructed.   Eat a soft diet for the next 24 hours.  FINDING OUT THE RESULTS OF YOUR TEST Not all test results are available during your visit. If your test results are not back during the visit, make an appointment with your caregiver to find out the results. Do not assume everything is normal if you have not heard from your caregiver or the medical facility. It is important for you to follow up on all of your test results.  SEEK IMMEDIATE MEDICAL ATTENTION IF:  You have more than a spotting of blood in your stool.   Your belly is swollen (abdominal distention).   You are nauseated or vomiting.   You have a temperature over 101.   You have abdominal pain or discomfort that is severe or gets worse throughout the day.   1 small polyp removed from your colon.  Your colon was biopsied to check for the cause of diarrhea.  Abnormal rectal lesion-biopsied -this lesion is going to require an operation to totally remove  Further recommendations to follow pending review of  pathology report  At patient request I called Robin at [phone] -called and left a message for return call.

## 2020-07-10 NOTE — Transfer of Care (Signed)
Immediate Anesthesia Transfer of Care Note  Patient: Brenda Mclean  Procedure(s) Performed: COLONOSCOPY WITH PROPOFOL (N/A ) BIOPSY POLYPECTOMY  Patient Location: PACU  Anesthesia Type:General  Level of Consciousness: awake, alert , oriented and patient cooperative  Airway & Oxygen Therapy: Patient Spontanous Breathing  Post-op Assessment: Report given to RN, Post -op Vital signs reviewed and stable and Patient moving all extremities X 4  Post vital signs: Reviewed and stable  Last Vitals:  Vitals Value Taken Time  BP    Temp    Pulse    Resp    SpO2      Last Pain:  Vitals:   07/10/20 1003  TempSrc:   PainSc: 0-No pain      Patients Stated Pain Goal: 7 (99/23/41 4436)  Complications: No complications documented.

## 2020-07-11 ENCOUNTER — Telehealth: Payer: Self-pay | Admitting: Internal Medicine

## 2020-07-11 NOTE — Telephone Encounter (Signed)
done

## 2020-07-11 NOTE — Telephone Encounter (Signed)
I was called by Dr. Thressa Sheller, the pathologist, today.  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  I called Brenda Mclean at home and informed her she has rectal cancer.  I recommend she see Dr. Leighton Ruff in [place].  She is agreeable.  Lets expedite that referral.  Please send a copy of this telephone encounter along with pathology report to her primary care physician.

## 2020-07-14 ENCOUNTER — Other Ambulatory Visit: Payer: Self-pay

## 2020-07-14 ENCOUNTER — Telehealth: Payer: Self-pay | Admitting: Internal Medicine

## 2020-07-14 DIAGNOSIS — C20 Malignant neoplasm of rectum: Secondary | ICD-10-CM

## 2020-07-14 NOTE — Telephone Encounter (Signed)
Manuela Schwartz- please send copy of this phone encounter along with pathology report to pts PCP.   [facility]- please arrange referral.

## 2020-07-14 NOTE — Telephone Encounter (Signed)
Urgent referral sent to Dr. Leighton Ruff via [facility].

## 2020-07-14 NOTE — Telephone Encounter (Signed)
[phone]  Patient called to find out status if referral to a surgeon in [place]    Please call her back after 3

## 2020-07-14 NOTE — Telephone Encounter (Signed)
Urgent referral was sent today via [facility] to [place]. Called and informed pt. Also gave her phone# to [facility] to call them if she hasn't heard anything in couple days.

## 2020-07-15 ENCOUNTER — Encounter (HOSPITAL_COMMUNITY): Payer: Self-pay | Admitting: Internal Medicine

## 2020-07-15 NOTE — Telephone Encounter (Signed)
Cce'd procedue, path and RMR phone message to PCP

## 2020-07-16 ENCOUNTER — Telehealth: Payer: Self-pay | Admitting: Internal Medicine

## 2020-07-16 ENCOUNTER — Telehealth: Payer: Self-pay

## 2020-07-16 DIAGNOSIS — C20 Malignant neoplasm of rectum: Secondary | ICD-10-CM

## 2020-07-16 NOTE — Telephone Encounter (Signed)
If you spoke with the patient and verified it was her and the patient personally asked for a copy of her procedure and path, it is ok to either leave a copy at the front for her to pick up or mail to her if patient requests it be mailed.

## 2020-07-16 NOTE — Telephone Encounter (Signed)
Called and informed pt [facility] requested CT chest, abd/pelvis prior to her appt. She is agreeable to CT scans.  CT chest, abd/pelvis w/contrast scheduled for 08/05/20 at 12:00pm, arrive at 11:45pm. Have creatinine done prior to appt, pick up contrast prior to appt and NPO 4 hours prior to test.   Called pt back and informed her of CT appt and details. Appt letter mailed with lab order.

## 2020-07-16 NOTE — Telephone Encounter (Signed)
Routing to [facility] in RMR's absence. Please advise if it is ok for pt to have CT chest, abd/pelvis with contrast d/t rectal cancer.

## 2020-07-16 NOTE — Telephone Encounter (Signed)
Pt called and would like a copy of her path/TCS reports so her daughters will know what to look out for when dealing with their health. JL, I don't thing a letter has been completed yet since RMR talked with pt personally. Please advise what I can send pt.

## 2020-07-16 NOTE — Telephone Encounter (Signed)
[facility] called asking to speak with ref. Coordinator or RMR's nurse. MB wasn't available. They have left message with patient that she is scheduled to see Dr Marcello Moores on 8/17 at 0930. They also need a CT of chest, abd, and pelvis ordered prior to appointment. Any questions call [phone]

## 2020-07-16 NOTE — Telephone Encounter (Signed)
Ok to have CT chest, abd/pelvis with contrast. She will need Cr updated prior.

## 2020-07-16 NOTE — Addendum Note (Signed)
Addended by: Hassan Rowan on: 07/16/2020 04:47 PM   Modules accepted: Orders

## 2020-07-17 NOTE — Telephone Encounter (Signed)
Noted. Spoke with pt and she would like pathology mailed to her. Mailed ok per JL.

## 2020-07-21 LAB — CREATININE, SERUM: Creat: 0.97 mg/dL — ABNORMAL HIGH (ref 0.60–0.93)

## 2020-08-05 ENCOUNTER — Other Ambulatory Visit: Payer: Self-pay

## 2020-08-05 ENCOUNTER — Ambulatory Visit (HOSPITAL_COMMUNITY)
Admission: RE | Admit: 2020-08-05 | Discharge: 2020-08-05 | Disposition: A | Discharge status: Home / Self Care | Payer: Medicare Other | Source: Ambulatory Visit | Attending: Gastroenterology | Admitting: Gastroenterology

## 2020-08-05 DIAGNOSIS — C20 Malignant neoplasm of rectum: Secondary | ICD-10-CM | POA: Diagnosis present

## 2020-08-05 IMAGING — CT CT CHEST W/ CM
4 of 5 series · 14 of 36 positions shown, 15 images · IV contrast (omnipaque)
Comparison: None.

CLINICAL DATA: High risk colon cancer surveillance: Personal
history of colonic polyps history of superficially invasive
adenocarcinoma in the rectal polyp removed over 10 years ago
transanally surgery, Chronic diarrhea persist. ?Stool studies
negative, Colonoscopy on [DATE]

EXAM:
CT CHEST, ABDOMEN, AND PELVIS WITH CONTRAST
TECHNIQUE: Multidetector CT imaging of the chest, abdomen and pelvis was
performed following the standard protocol during bolus
administration of intravenous contrast.
CONTRAST:  100mL OMNIPAQUE IOHEXOL 300 MG/ML  SOLN

[Series 2: cap with · axial · 0.78mm/px · z∈[+1129,+1494]mm · 4 of 123 slices shown, 5 images]
[im 25/123  mediastinal]
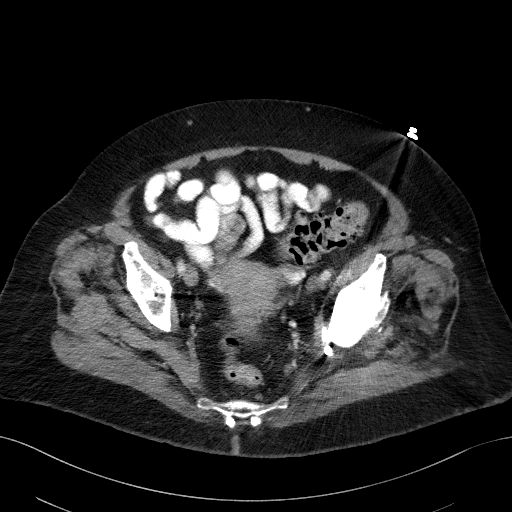
[im 25/123  lung]
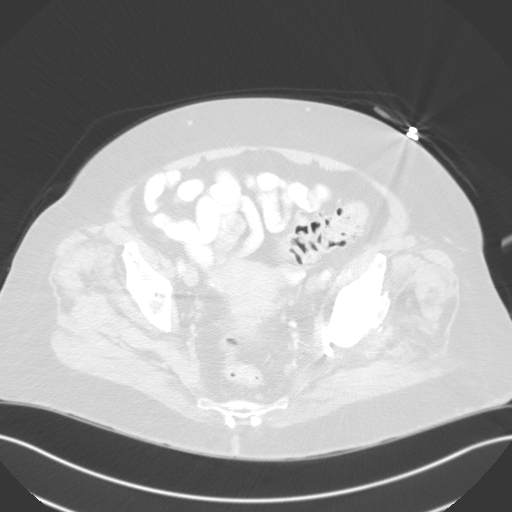
[im 49/123  lung]
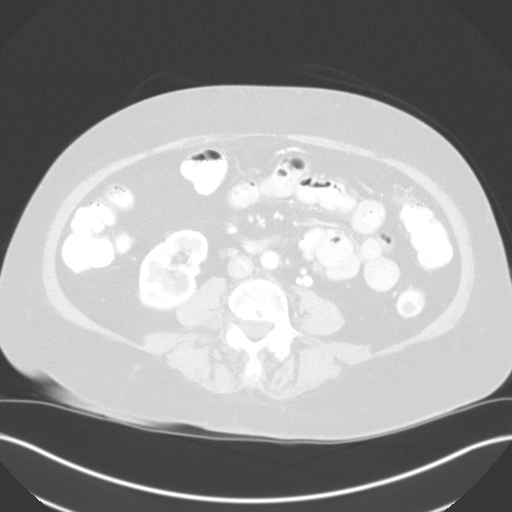
[im 74/123  lung]
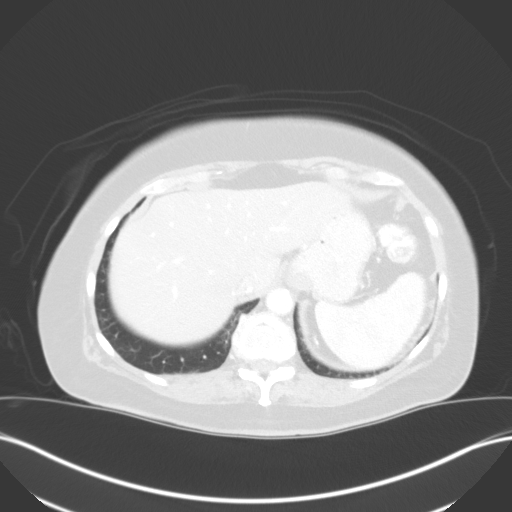
[im 98/123  lung]
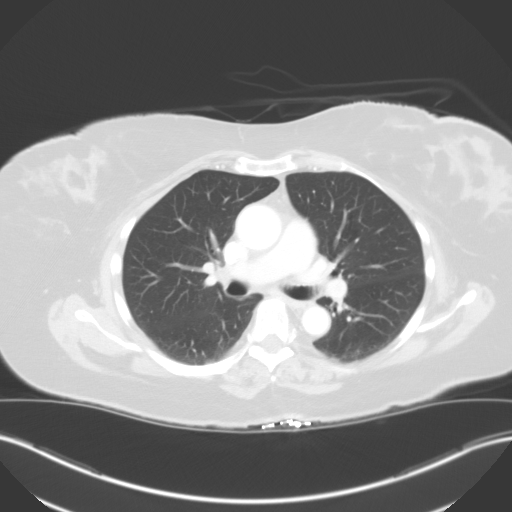

[Series 4: coronals · coronal · 0.63mm/px · 3 of 123 slices shown]
[im 25/123  lung]
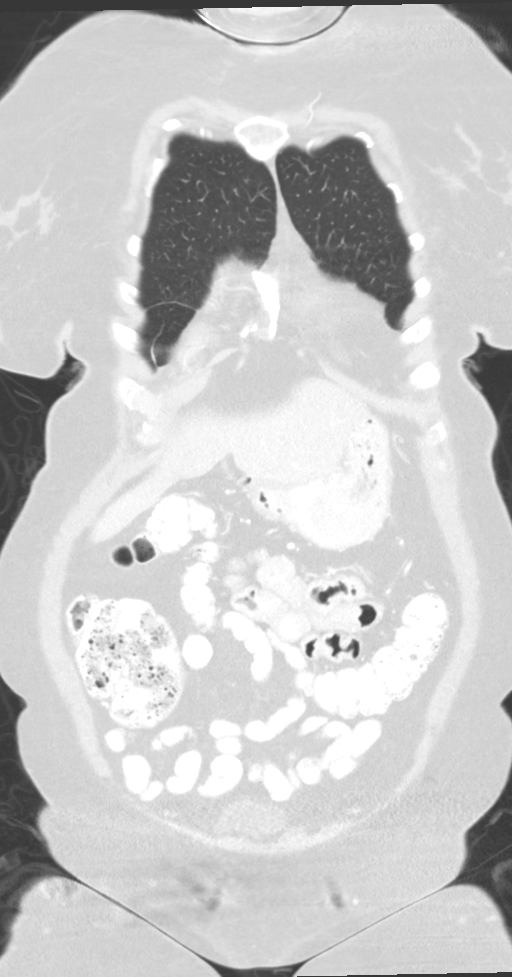
[im 49/123  lung]
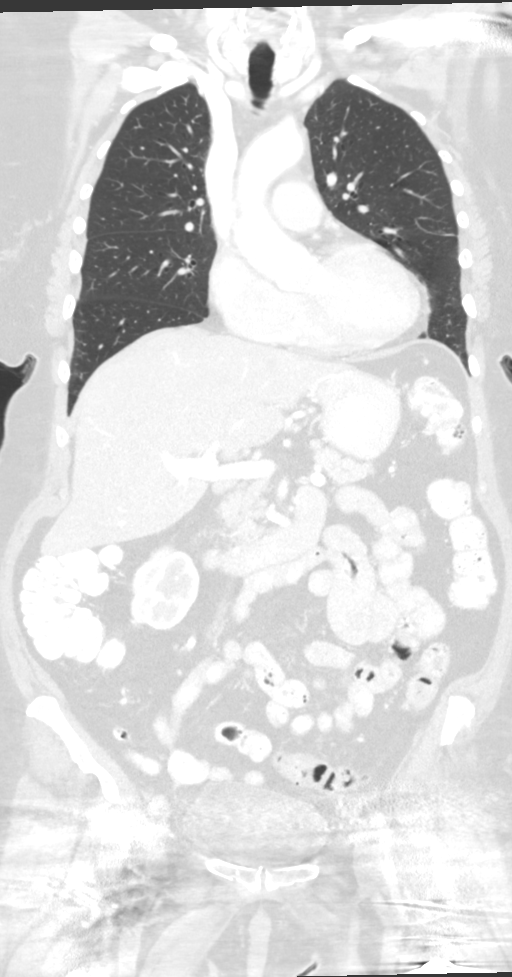
[im 74/123  lung]
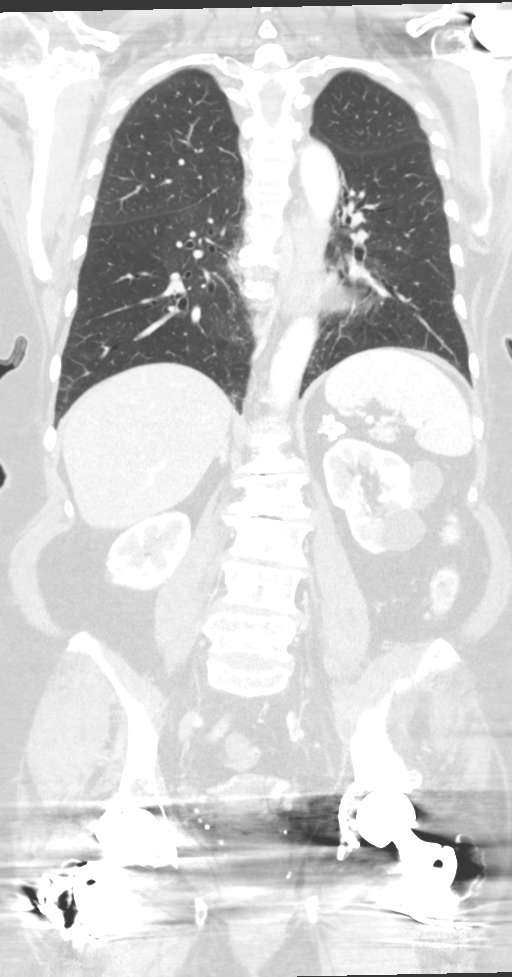

[Series 8: delay · axial · delayed · 0.77mm/px · z∈[+1132,+1252]mm · 2 of 74 slices shown]
[im 25/74  lung]
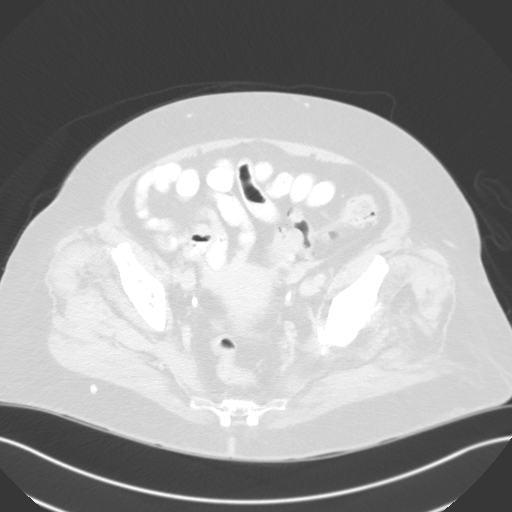
[im 49/74  lung]
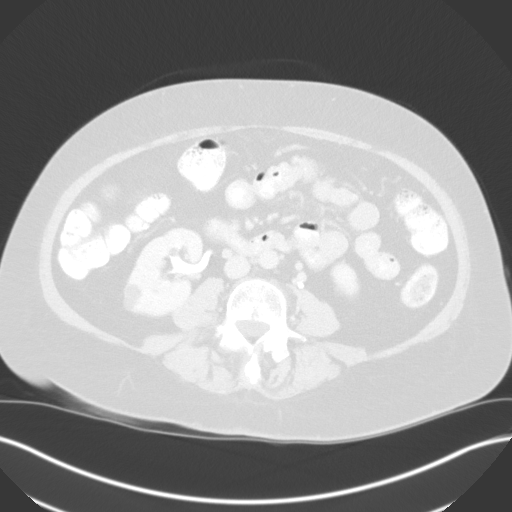

[Series 9: super (person_name) (person_name) · axial · 0.78mm/px · z∈[+1367,+1501]mm · 5 of 356 slices shown]
[im 42/356  lung]
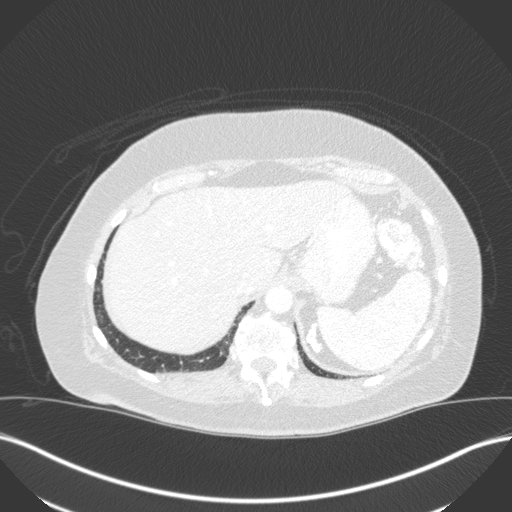
[im 84/356  lung]
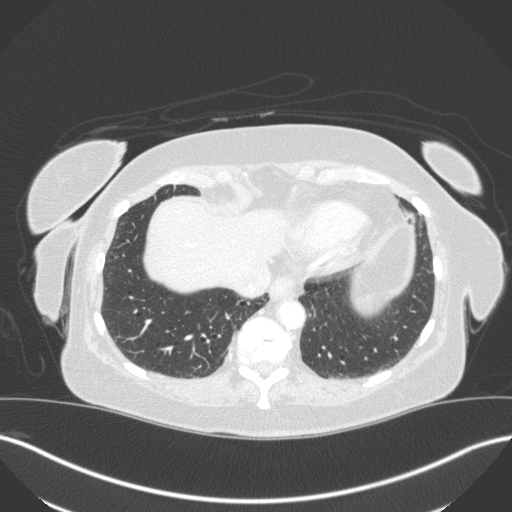
[im 126/356  lung]
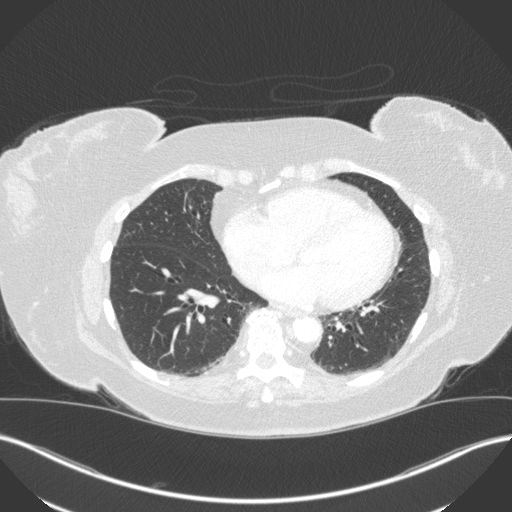
[im 168/356  lung]
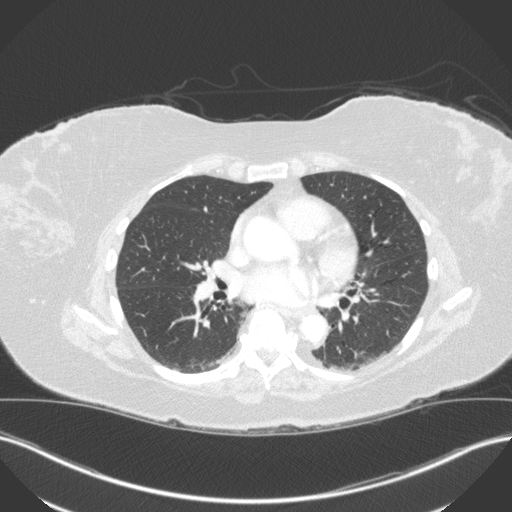
[im 209/356  lung]
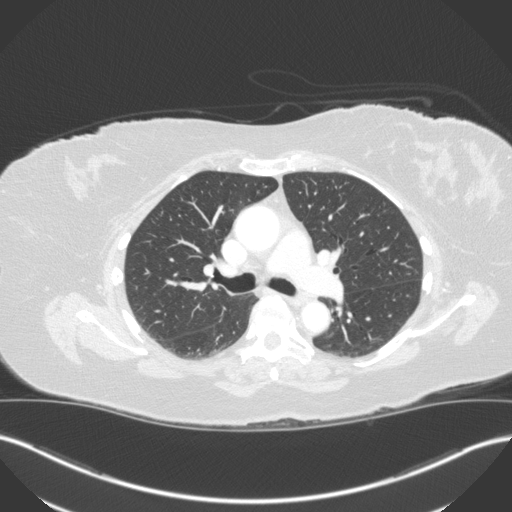

[14 of 36 positions shown; findings below may reference images not displayed]

FINDINGS: CT CHEST FINDINGS

Cardiovascular: Heart is normal in size and configuration. No
pericardial effusion. No coronary artery calcifications. Great
vessels are normal in caliber. Mild aortic atherosclerosis. No
aneurysm or dissection.

Mediastinum/Nodes: No neck base, mediastinal or hilar masses or
enlarged lymph nodes. Trachea and esophagus are unremarkable.

Lungs/Pleura: Mild areas of linear and peripheral reticular opacity
consistent with scarring and minimal atelectasis. Mild
bronchiectasis noted in the lower lobes. No lung consolidation or
edema. No mass or suspicious nodule. No pleural effusion or
pneumothorax.

Musculoskeletal: No acute fracture. No osteoblastic or osteolytic
lesions. Well-positioned left shoulder prosthesis. Degenerative
changes noted throughout the thoracic spine.

CT ABDOMEN PELVIS FINDINGS

Hepatobiliary: No focal liver abnormality is seen. Status post
cholecystectomy. No biliary dilatation.

Pancreas: Unremarkable. No pancreatic ductal dilatation or
surrounding inflammatory changes.

Spleen: Normal in size without focal abnormality.

Adrenals/Urinary Tract: Multiple calcifications in the expected
location of the left adrenal gland, likely from remote left adrenal
hemorrhage. Normal right adrenal gland.

Kidneys normal in size, orientation and position with symmetric
enhancement and excretion. Exophytic low-density mass arises from
the midpole of the left kidney, 2.9 cm in size, with some apparent
mild wall thickening posteriorly and inferiorly. There are
additional bilateral low-attenuation renal masses that are
consistent with cysts. No intrarenal stones. No hydronephrosis.
Normal ureters. Bladder is unremarkable but partly obscured by
artifact from bilateral total hip arthroplasties.

Stomach/Bowel: No evidence of a colon mass. Numerous left colon
diverticula mostly along the sigmoid. No evidence of diverticulitis.
No colon wall thickening or other inflammatory process.

Normal stomach and small bowel.  Normal appendix visualized.

Vascular/Lymphatic: Mild aortic atherosclerosis. No aneurysm. Branch
vessels appear widely patent. No enlarged mesenteric,
retroperitoneal or pelvic lymph nodes.

Reproductive: Uterus and bilateral adnexa are unremarkable.

Other: No abdominal wall hernia or abnormality. No abdominopelvic
ascites.

Musculoskeletal: Well positioned bilateral total hip arthroplasties.
No acute fracture. No osteoblastic or osteolytic lesions.
IMPRESSION: 1. No evidence of neoplastic disease within the chest, abdomen or
pelvis.
2. No acute findings.
3. Left colon diverticulosis without evidence of diverticulitis.
4. Complicated left midpole renal cysts versus a complex cystic
mass. Recommend nonemergent follow-up, preferably with renal MRI
without and with contrast for further assessment and
characterization.
5. Chronic findings include aortic atherosclerosis and changes
consistent with a remote left adrenal gland hemorrhage.

## 2020-08-05 IMAGING — CT CT ABD-PELV W/ CM
4 of 5 series · 14 of 36 positions shown, 15 images · IV contrast (Omnipaque or Isovue)
Comparison: None.

CLINICAL DATA: High risk colon cancer surveillance: Personal
history of colonic polyps history of superficially invasive
adenocarcinoma in the rectal polyp removed over 10 years ago
transanally surgery, Chronic diarrhea persist. ?Stool studies
negative, Colonoscopy on [DATE]

EXAM:
CT CHEST, ABDOMEN, AND PELVIS WITH CONTRAST
TECHNIQUE: Multidetector CT imaging of the chest, abdomen and pelvis was
performed following the standard protocol during bolus
administration of intravenous contrast.
CONTRAST:  100mL OMNIPAQUE IOHEXOL 300 MG/ML  SOLN

[Series 2: cap with · axial · 0.78mm/px · z∈[+1129,+1494]mm · 4 of 123 slices shown, 5 images]
[im 25/123  mediastinal]
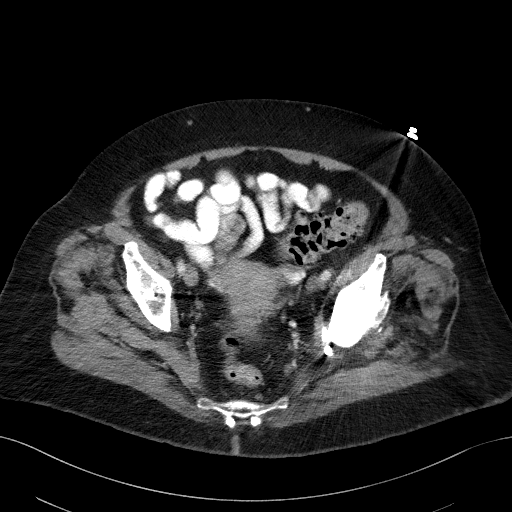
[im 25/123  lung]
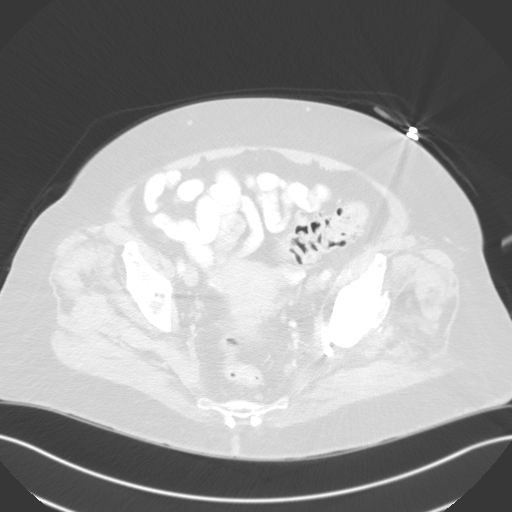
[im 49/123  lung]
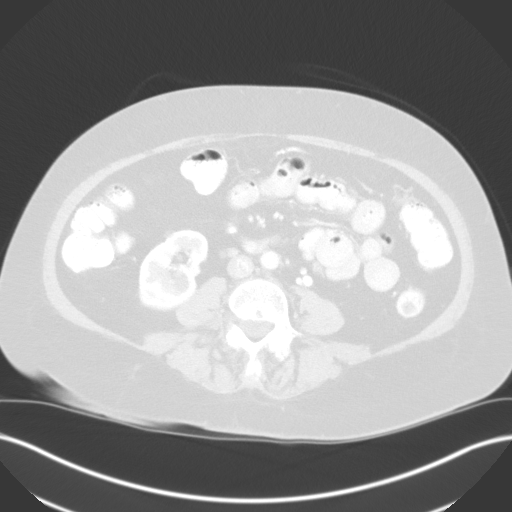
[im 74/123  lung]
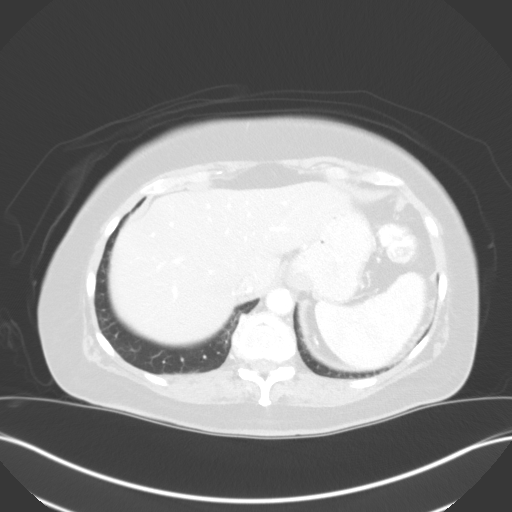
[im 98/123  lung]
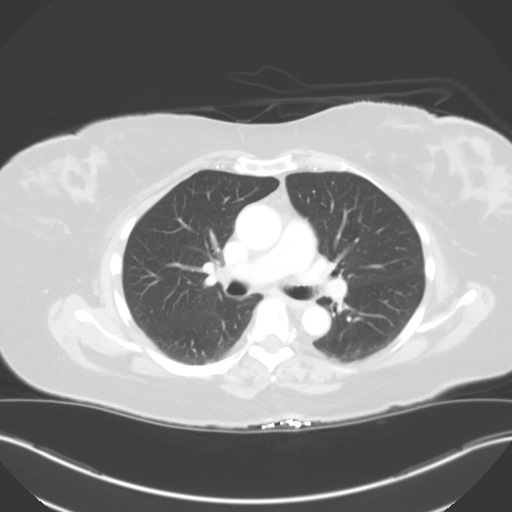

[Series 4: coronals · coronal · 0.63mm/px · 3 of 123 slices shown]
[im 25/123  lung]
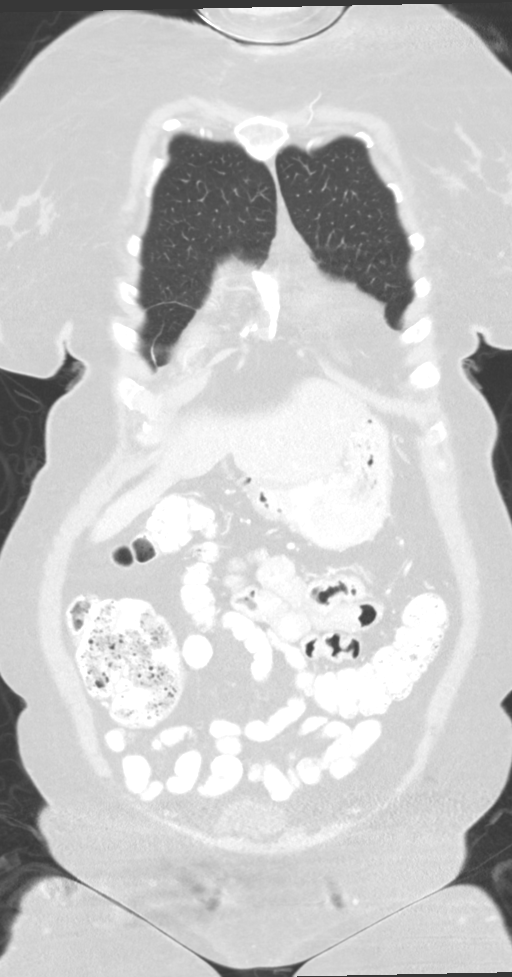
[im 49/123  lung]
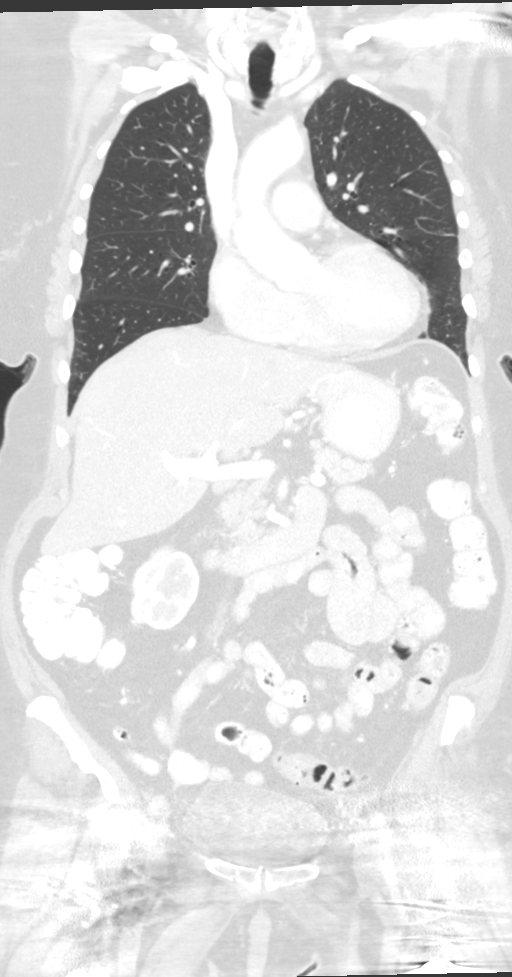
[im 74/123  lung]
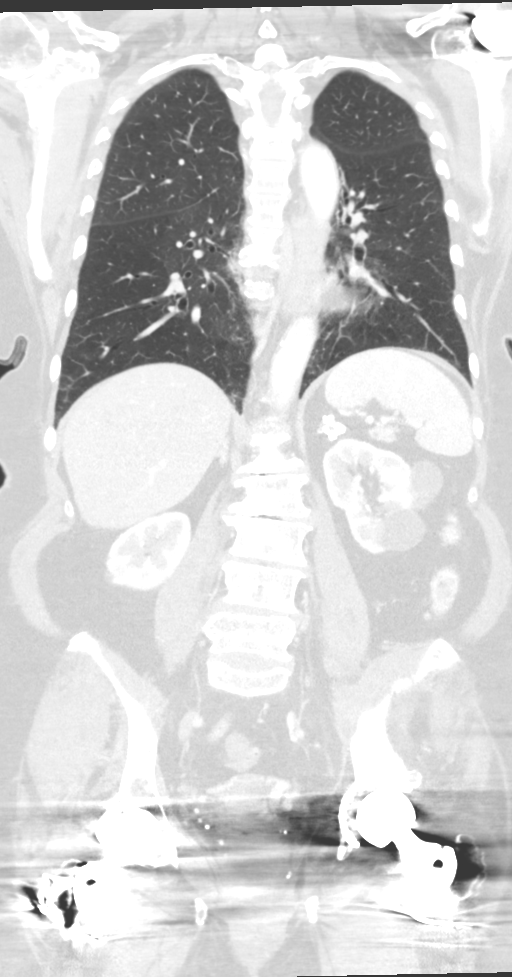

[Series 8: delay · axial · delayed · 0.77mm/px · z∈[+1132,+1252]mm · 2 of 74 slices shown]
[im 25/74  lung]
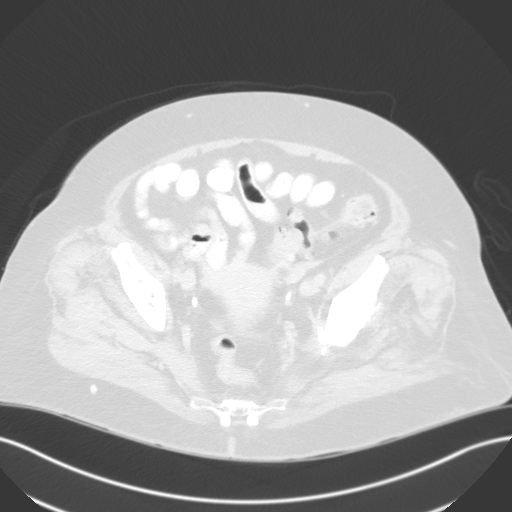
[im 49/74  lung]
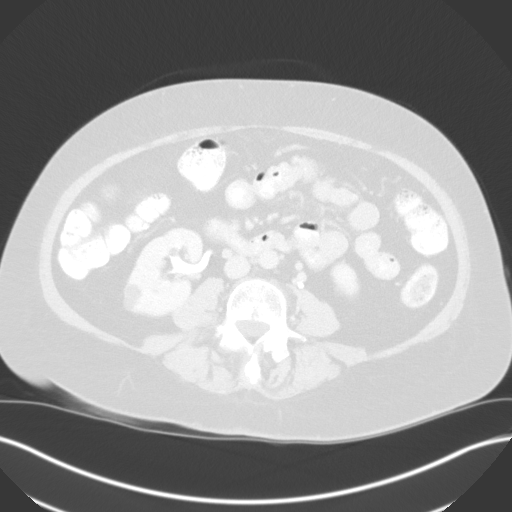

[Series 9: super (person_name) (person_name) · axial · 0.78mm/px · z∈[+1367,+1501]mm · 5 of 356 slices shown]
[im 42/356  lung]
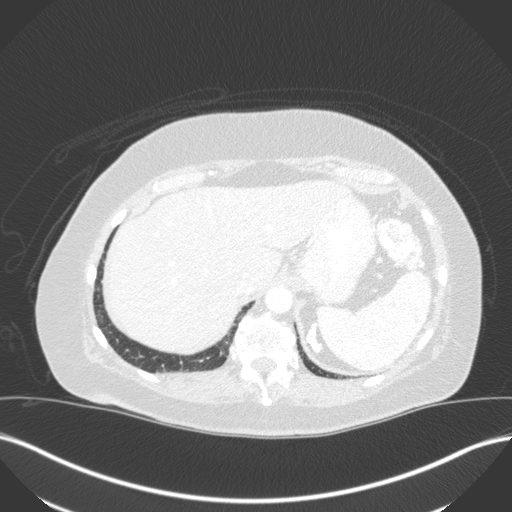
[im 84/356  lung]
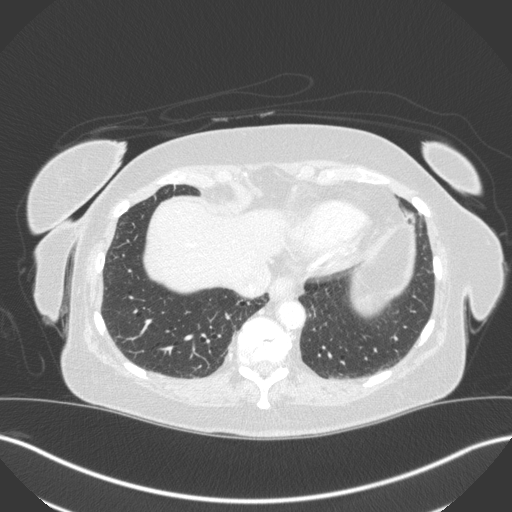
[im 126/356  lung]
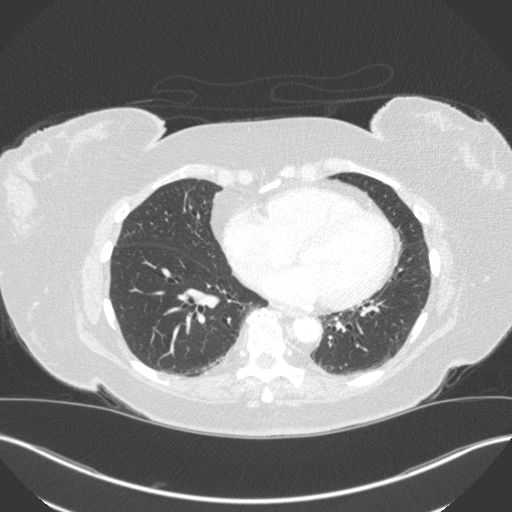
[im 168/356  lung]
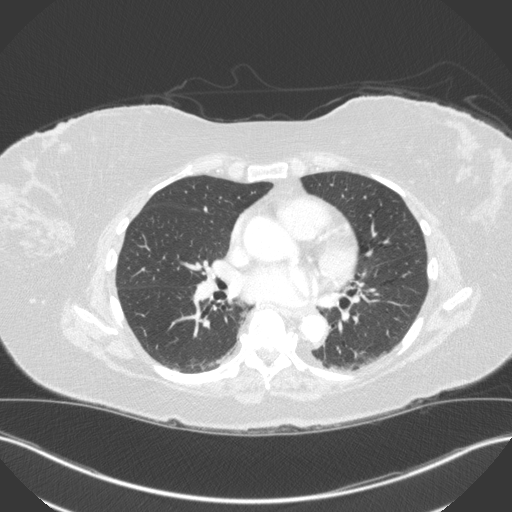
[im 209/356  lung]
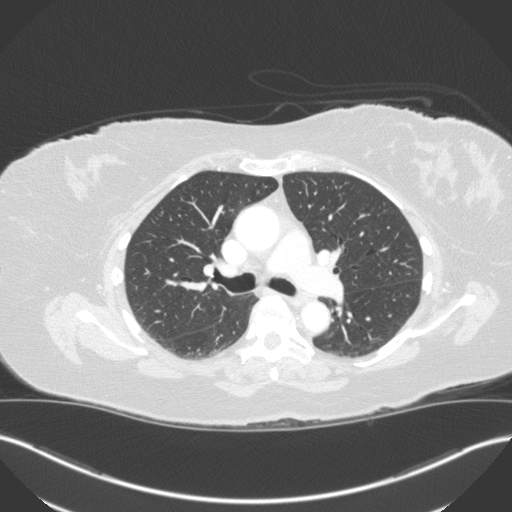

[14 of 36 positions shown; findings below may reference images not displayed]

FINDINGS: CT CHEST FINDINGS

Cardiovascular: Heart is normal in size and configuration. No
pericardial effusion. No coronary artery calcifications. Great
vessels are normal in caliber. Mild aortic atherosclerosis. No
aneurysm or dissection.

Mediastinum/Nodes: No neck base, mediastinal or hilar masses or
enlarged lymph nodes. Trachea and esophagus are unremarkable.

Lungs/Pleura: Mild areas of linear and peripheral reticular opacity
consistent with scarring and minimal atelectasis. Mild
bronchiectasis noted in the lower lobes. No lung consolidation or
edema. No mass or suspicious nodule. No pleural effusion or
pneumothorax.

Musculoskeletal: No acute fracture. No osteoblastic or osteolytic
lesions. Well-positioned left shoulder prosthesis. Degenerative
changes noted throughout the thoracic spine.

CT ABDOMEN PELVIS FINDINGS

Hepatobiliary: No focal liver abnormality is seen. Status post
cholecystectomy. No biliary dilatation.

Pancreas: Unremarkable. No pancreatic ductal dilatation or
surrounding inflammatory changes.

Spleen: Normal in size without focal abnormality.

Adrenals/Urinary Tract: Multiple calcifications in the expected
location of the left adrenal gland, likely from remote left adrenal
hemorrhage. Normal right adrenal gland.

Kidneys normal in size, orientation and position with symmetric
enhancement and excretion. Exophytic low-density mass arises from
the midpole of the left kidney, 2.9 cm in size, with some apparent
mild wall thickening posteriorly and inferiorly. There are
additional bilateral low-attenuation renal masses that are
consistent with cysts. No intrarenal stones. No hydronephrosis.
Normal ureters. Bladder is unremarkable but partly obscured by
artifact from bilateral total hip arthroplasties.

Stomach/Bowel: No evidence of a colon mass. Numerous left colon
diverticula mostly along the sigmoid. No evidence of diverticulitis.
No colon wall thickening or other inflammatory process.

Normal stomach and small bowel.  Normal appendix visualized.

Vascular/Lymphatic: Mild aortic atherosclerosis. No aneurysm. Branch
vessels appear widely patent. No enlarged mesenteric,
retroperitoneal or pelvic lymph nodes.

Reproductive: Uterus and bilateral adnexa are unremarkable.

Other: No abdominal wall hernia or abnormality. No abdominopelvic
ascites.

Musculoskeletal: Well positioned bilateral total hip arthroplasties.
No acute fracture. No osteoblastic or osteolytic lesions.
IMPRESSION: 1. No evidence of neoplastic disease within the chest, abdomen or
pelvis.
2. No acute findings.
3. Left colon diverticulosis without evidence of diverticulitis.
4. Complicated left midpole renal cysts versus a complex cystic
mass. Recommend nonemergent follow-up, preferably with renal MRI
without and with contrast for further assessment and
characterization.
5. Chronic findings include aortic atherosclerosis and changes
consistent with a remote left adrenal gland hemorrhage.

## 2020-08-05 MED ORDER — IOHEXOL 300 MG/ML  SOLN
100.0000 mL | Freq: Once | INTRAMUSCULAR | Status: AC | PRN
  Administered 2020-08-05: 100 mL via INTRAVENOUS

## 2020-08-07 ENCOUNTER — Other Ambulatory Visit: Payer: Self-pay

## 2020-08-07 DIAGNOSIS — N281 Cyst of kidney, acquired: Secondary | ICD-10-CM

## 2020-08-08 ENCOUNTER — Telehealth: Payer: Self-pay | Admitting: Internal Medicine

## 2020-08-08 ENCOUNTER — Telehealth: Payer: Self-pay

## 2020-08-08 NOTE — Telephone Encounter (Signed)
Pt called office and LMOVM, unable to have MRI 08/26/20. Tried to call pt back, no answer, LMOVM and gave her phone number for central scheduling to reschedule MRI.

## 2020-08-08 NOTE — Telephone Encounter (Signed)
Pt called to see if we could call in something to calm her nerves while she has her MRI done. She uses [facility]. Please advise. [phone]

## 2020-08-08 NOTE — Telephone Encounter (Signed)
Per appt desk, MRI scheduled for 08/27/20.

## 2020-08-11 ENCOUNTER — Other Ambulatory Visit: Payer: Self-pay | Admitting: Gastroenterology

## 2020-08-11 DIAGNOSIS — F419 Anxiety disorder, unspecified: Secondary | ICD-10-CM

## 2020-08-11 NOTE — Telephone Encounter (Signed)
Routing to [employer] [facility], [place]. Pts MRI is on 08/26/20.

## 2020-08-11 NOTE — Telephone Encounter (Signed)
I can send in Ativan 2 mg. She will take 1 tablet 1 hour before the MRI. She will need a driver the day of her MRI.

## 2020-08-12 ENCOUNTER — Encounter: Payer: Self-pay | Admitting: General Surgery

## 2020-08-12 ENCOUNTER — Other Ambulatory Visit: Payer: Self-pay | Admitting: Gastroenterology

## 2020-08-12 DIAGNOSIS — F419 Anxiety disorder, unspecified: Secondary | ICD-10-CM

## 2020-08-12 MED ORDER — LORAZEPAM 2 MG PO TABS: ORAL_TABLET | ORAL | 0 refills | Status: DC

## 2020-08-12 NOTE — Telephone Encounter (Signed)
Lmom, waiting on a return call.  

## 2020-08-12 NOTE — Progress Notes (Signed)
Ativan 2 mg po x1 dose sent to pharmacy for upcoming MRI. Instructions to take 1 hour prior to MRI.

## 2020-08-12 NOTE — Telephone Encounter (Signed)
Rx for Ativan 2mg  x1 dose 1 hour prior to MRI printed and given to Derrick Ravel to fax to pharmacy.

## 2020-08-12 NOTE — Telephone Encounter (Signed)
Spoke with and she would like the Ativan sent to her pharmacy.

## 2020-08-12 NOTE — Telephone Encounter (Signed)
Noted. Faxed script to pts pharmacy.

## 2020-08-13 NOTE — Progress Notes (Signed)
GI Location of Tumor / Histology: Malignant neoplasm of rectum  Brenda Mclean presented for routine colonoscopy  CT CAP 08/05/2020: No evidence of neoplastic disease within the chest, abdomen or pelvis.  Biopsies of Rectum/ Colon 07/10/2020   Past/Anticipated interventions by surgeon, if any:  Dr. Marcello Moores 08/12/2020 -History of a rectal cancer that was resected within a polyp approximately 5 years ago. - Tumor was noted approximately 8 cm from the anal verge.  On exam, this appears correct, it is a posterior midline just above the level of the coccyx. -Given that this is her second rectal cancer at a different location, I would like for her to be evaluated in genetics clinic. -We will also make referrals for radiation and oncology to start her neoadjuvant treatment. - I'll see her back in the office after completing radiation to get her scheduled for surgery. -We discussed that she will need a low anterior resection with temporary diverting ileostomy.  We discussed that given her loose bowel movements, she may experience some incontinence with this.   Past/Anticipated interventions by medical oncology, if any:   Weight changes, if any: Lost about 25-30 pounds in the last year and a half due to diagnosis of Covid.  Bowel/Bladder complaints, if any: Had some difficulty with bowel movements.  Nausea / Vomiting, if any: no  Pain issues, if any:  Discomfort with bowel movements  Any blood per rectum: No  SAFETY ISSUES:  Prior radiation? no  Pacemaker/ICD? no  Possible current pregnancy? Postmenopausal  Is the patient on methotrexate? no  Current Complaints/Details: -Genetics appointment is unscheduled at this time. -History of a rectal cancer that was resected within a polyp approximately 5 years ago.

## 2020-08-14 ENCOUNTER — Other Ambulatory Visit: Payer: Self-pay

## 2020-08-14 ENCOUNTER — Encounter: Payer: Self-pay | Admitting: Radiation Oncology

## 2020-08-14 ENCOUNTER — Ambulatory Visit
Admission: RE | Admit: 2020-08-14 | Discharge: 2020-08-14 | Disposition: A | Discharge status: Home / Self Care | Payer: Medicare Other | Source: Ambulatory Visit | Attending: Radiation Oncology | Admitting: Radiation Oncology

## 2020-08-14 ENCOUNTER — Encounter (HOSPITAL_COMMUNITY): Payer: Self-pay

## 2020-08-14 VITALS — Ht 61.0 in | Wt 170.0 lb

## 2020-08-14 DIAGNOSIS — C20 Malignant neoplasm of rectum: Secondary | ICD-10-CM

## 2020-08-14 NOTE — Progress Notes (Signed)
Radiation Oncology         [phone] ________________________________  Initial Outpatient Consultation - Conducted via telephone due to current COVID-19 concerns for limiting patient exposure  I spoke with the patient to conduct this consult visit via telephone to spare the patient unnecessary potential exposure in the healthcare setting during the current COVID-19 pandemic. The patient was notified in advance and was offered a [place] meeting to allow for face to face communication but unfortunately reported that they did not have the appropriate resources/technology to support such a visit and instead preferred to proceed with a telephone consult.   Name: Brenda Mclean        MRN: [id number]  Date of Service: 08/14/2020 DOB: 1946-03-13  MH:[id number], Laymond Purser, MD  Leighton Ruff, MD     REFERRING PHYSICIAN: Leighton Ruff, MD   DIAGNOSIS: The encounter diagnosis was Rectal cancer [facility]).   HISTORY OF PRESENT ILLNESS: Brenda Mclean is a 74 y.o. female seen at the request of Dr. Marcello Moores for newly diagnosed rectal cancer.  The patient has a history of carcinoma within a rectal polyp that was excised approximately 5 years ago.  It does not appear that she was following with close surveillance however developed Covid last year, and since that time has been having progressive diarrhea, she was seen and evaluated by gastroenterology and underwent colonoscopy with Dr. Gala Romney on 07/10/2020.  His notes indicate a movable depressed lesion at the tip of the examination finger on digital rectal examination.  She had scattered small and large mouth diverticula throughout the entire colon, a 5 mm sessile polyp was noted, but in the rectum 8 cm from the anal verge was a 2.5 cm centrally depressed ulcerated lesion and distally above the anal verge there was a suture line consistent with prior transanal polypectomy.  She underwent staging imaging with CT scan of the chest abdomen pelvis on 08/05/2020 which  revealed stigmata of scarring and atelectasis within the lungs, multiple calcifications in the left adrenal gland, and an exophytic low-density mass in the midpole of the left kidney measuring 2.9 cm with some apparent mild wall thickening as well as bilateral low-attenuation renal masses consistent with cysts.  No adenopathy was seen in the pelvis, or evidence of metastatic disease.  She is scheduled to have an MRI of the abdomen on 08/27/2020 to follow-up with the kidney findings.  She met with Dr. Marcello Moores who recommended meeting with medical and radiation oncology to pursue chemoradiation.  She is seen today to discuss treatment recommendations for her cancer.   PREVIOUS RADIATION THERAPY: No   PAST MEDICAL HISTORY:  Past Medical History:  Diagnosis Date  . Arthritis   . COVID-19 03/2019  . GERD (gastroesophageal reflux disease)   . HLD (hyperlipidemia)   . HTN (hypertension)   . 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 Sigmoid diverticulitis 1031   Uncomplicated       PAST SURGICAL HISTORY: Past Surgical History:  Procedure Laterality Date  . BIOPSY  07/10/2020   Procedure: BIOPSY;  Surgeon: [name], MD;  [facility];  Service: Endoscopy;;  . CHOLECYSTECTOMY  1998  . COLON SURGERY  10/30/2012   Dr. Audrie Lia; transanal resection of 2 cm rectal polyp; pathology revealed superficially invasive moderately differentiated adenocarcinoma arising in adenomatous polyp of distal rectum.  . COLONOSCOPY  10/24/2013   Dr. West Carbo; with propofol; small granuloma and sutures distal rectum, small polyp mid rectum removed, sigmoid diverticulosis, otherwise normal exam.  No pathology received.  Recommended colonoscopy in 5 years.  . COLONOSCOPY  09/08/2012   Dr. West Carbo; with propofol; multi lobulated friable 3 cm mass distal rectum s/p multiple biopsies, scattered sigmoid diverticulosis, otherwise normal exam.  Pathology with adenomatous polyp.  . COLONOSCOPY WITH PROPOFOL N/A  07/10/2020   Procedure: COLONOSCOPY WITH PROPOFOL;  Surgeon: [name], MD;  Location[facility];  Service: Endoscopy;  Laterality: N/A;  10:00am  . POLYPECTOMY  07/10/2020   Procedure: POLYPECTOMY;  Surgeon: [name], MD;  [facility];  Service: Endoscopy;;  . REPLACEMENT TOTAL HIP W/  RESURFACING IMPLANTS Bilateral   . 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 REPLACEMENT TOTAL KNEE Right 12/03/2014  . TOTAL SHOULDER ARTHROPLASTY  04/26/2018     FAMILY HISTORY:  Family History  Problem Relation Age of Onset  . Colon cancer Neg Hx      SOCIAL HISTORY:  reports that she has never smoked. She has never used smokeless tobacco. She reports that she does not drink alcohol and does not use drugs. The patient is single and lives in [place], which is [place].    ALLERGIES: Patient has no known allergies.   MEDICATIONS:  Current Outpatient Medications  Medication Sig Dispense Refill  . celecoxib (CELEBREX) 200 MG capsule Take 200 mg by mouth daily.    Marland Kitchen LORazepam (ATIVAN) 2 MG tablet Take 1 tablet (24m total) by mouth 1 hour prior to MRI scheduled for 9/1. 1 tablet 0  . metoprolol succinate (TOPROL-XL) 25 MG 24 hr tablet Take 25 mg by mouth daily.    . Multiple Vitamin (MULTIVITAMIN WITH MINERALS) TABS tablet Take 1 tablet by mouth daily.    .Marland Kitchenomeprazole (PRILOSEC) 20 MG capsule Take 20 mg by mouth daily.    . polyethylene glycol-electrolytes (NULYTELY) 420 g solution As directed 4000 mL 0  . simvastatin (ZOCOR) 40 MG tablet Take 40 mg by mouth at bedtime.     . valsartan (DIOVAN) 320 MG tablet Take 320 mg by mouth daily.     No current facility-administered medications for this encounter.     REVIEW OF SYSTEMS: On review of systems, the patient reports that she is doing pretty well overall.  She reports that she continues to have episodes of diarrhea, but denies rectal bleeding pain or discomfort with having a bowel movement or  sitting.  She has not had any unintended weight changes.  Since her Covid diagnosis, she did receive the Moderna vaccines which she recalls receiving in February and March 2021.    PHYSICAL EXAM:  Wt Readings from Last 3 Encounters:  07/10/20 170 lb (77.1 kg)  03/24/20 175 lb 12.8 oz (79.7 kg)   Unable to assess due to encounter type.  ECOG = 1  0 - Asymptomatic (Fully active, able to carry on all predisease activities without restriction)  1 - Symptomatic but completely ambulatory (Restricted in physically strenuous activity but ambulatory and able to carry out work of a light or sedentary nature. For example, light housework, office work)  2 - Symptomatic, <50% in bed during the day (Ambulatory and capable of all self care but unable to carry out any work activities. Up and about more than 50% of waking hours)  3 - Symptomatic, >50% in bed, but not bedbound (Capable of only limited self-care, confined to bed or chair 50% or more of waking hours)  4 - Bedbound (Completely disabled. Cannot carry on any self-care. Totally confined to bed or  chair)  5 - Death   Eustace Pen MM, Creech RH, Tormey DC, et al. [phone]). "Toxicity and response criteria of the [employer] [facility] [employer]". [place] Oncol. 5 (6): 649-55    LABORATORY DATA:  No results found for: WBC, HGB, HCT, MCV, PLT No results found for: NA, K, CL, CO2 No results found for: ALT, AST, GGT, ALKPHOS, BILITOT    RADIOGRAPHY: CT CHEST W CONTRAST  Result Date: 08/05/2020 CLINICAL DATA:  High risk colon cancer surveillance: Personal history of colonic polyps history of superficially invasive adenocarcinoma in the rectal polyp removed over 10 years ago transanally surgery, Chronic diarrhea persist. ?Stool studies negative, Colonoscopy on 07/29/20 EXAM: CT CHEST, ABDOMEN, AND PELVIS WITH CONTRAST TECHNIQUE: Multidetector CT imaging of the chest, abdomen and pelvis was performed following the standard protocol during bolus  administration of intravenous contrast. CONTRAST:  12m OMNIPAQUE IOHEXOL 300 MG/ML  SOLN COMPARISON:  None. FINDINGS: CT CHEST FINDINGS Cardiovascular: Heart is normal in size and configuration. No pericardial effusion. No coronary artery calcifications. Great vessels are normal in caliber. Mild aortic atherosclerosis. No aneurysm or dissection. Mediastinum/Nodes: No neck base, mediastinal or hilar masses or enlarged lymph nodes. Trachea and esophagus are unremarkable. Lungs/Pleura: Mild areas of linear and peripheral reticular opacity consistent with scarring and minimal atelectasis. Mild bronchiectasis noted in the lower lobes. No lung consolidation or edema. No mass or suspicious nodule. No pleural effusion or pneumothorax. Musculoskeletal: No acute fracture. No osteoblastic or osteolytic lesions. Well-positioned left shoulder prosthesis. Degenerative changes noted throughout the thoracic spine. CT ABDOMEN PELVIS FINDINGS Hepatobiliary: No focal liver abnormality is seen. Status post cholecystectomy. No biliary dilatation. Pancreas: Unremarkable. No pancreatic ductal dilatation or surrounding inflammatory changes. Spleen: Normal in size without focal abnormality. 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 Normal right adrenal gland. Kidneys normal in size, orientation and position with symmetric enhancement and excretion. Exophytic low-density mass arises from the midpole of the left kidney, 2.9 cm in size, with some apparent mild wall thickening posteriorly and inferiorly. There are additional bilateral low-attenuation renal masses that are consistent with cysts. No intrarenal stones. No hydronephrosis. Normal ureters. Bladder is unremarkable but partly obscured by artifact from bilateral total hip arthroplasties. Stomach/Bowel: No evidence of a colon mass. Numerous left colon diverticula mostly along the  sigmoid. No evidence of diverticulitis. No colon wall thickening or other inflammatory process. Normal stomach and small bowel.  Normal appendix visualized. Vascular/Lymphatic: Mild aortic atherosclerosis. No aneurysm. Branch vessels appear widely patent. No enlarged mesenteric, retroperitoneal or pelvic lymph nodes. Reproductive: Uterus and bilateral adnexa are unremarkable. Other: No abdominal wall hernia or abnormality. No abdominopelvic ascites. Musculoskeletal: Well positioned bilateral total hip arthroplasties. No acute fracture. No osteoblastic or osteolytic lesions. IMPRESSION: 1. No evidence of neoplastic disease within the chest, abdomen or pelvis. 2. No acute findings. 3. Left colon diverticulosis without evidence of diverticulitis. 4. Complicated left midpole renal cysts versus a complex cystic mass. Recommend nonemergent follow-up, preferably with renal MRI without and with contrast for further assessment and characterization. 5. 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 Electronically Signed   By: DLajean ManesM.D.   On: 08/05/2020 19:54   CT Abdomen Pelvis W Contrast  Result Date: 08/05/2020 CLINICAL DATA:  High risk colon cancer surveillance: Personal history of colonic polyps history of superficially invasive adenocarcinoma in the rectal polyp removed over 10 years ago transanally surgery, Chronic  diarrhea persist. ?Stool studies negative, Colonoscopy on 07/26/20 EXAM: CT CHEST, ABDOMEN, AND PELVIS WITH CONTRAST TECHNIQUE: Multidetector CT imaging of the chest, abdomen and pelvis was performed following the standard protocol during bolus administration of intravenous contrast. CONTRAST:  15m OMNIPAQUE IOHEXOL 300 MG/ML  SOLN COMPARISON:  None. FINDINGS: CT CHEST FINDINGS Cardiovascular: Heart is normal in size and configuration. No pericardial effusion. No coronary artery calcifications. Great vessels are normal in  caliber. Mild aortic atherosclerosis. No aneurysm or dissection. Mediastinum/Nodes: No neck base, mediastinal or hilar masses or enlarged lymph nodes. Trachea and esophagus are unremarkable. Lungs/Pleura: Mild areas of linear and peripheral reticular opacity consistent with scarring and minimal atelectasis. Mild bronchiectasis noted in the lower lobes. No lung consolidation or edema. No mass or suspicious nodule. No pleural effusion or pneumothorax. Musculoskeletal: No acute fracture. No osteoblastic or osteolytic lesions. Well-positioned left shoulder prosthesis. Degenerative changes noted throughout the thoracic spine. CT ABDOMEN PELVIS FINDINGS Hepatobiliary: No focal liver abnormality is seen. Status post cholecystectomy. No biliary dilatation. Pancreas: Unremarkable. No pancreatic ductal dilatation or surrounding inflammatory changes. Spleen: Normal in size without focal abnormality. 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 Normal right adrenal gland. Kidneys normal in size, orientation and position with symmetric enhancement and excretion. Exophytic low-density mass arises from the midpole of the left kidney, 2.9 cm in size, with some apparent mild wall thickening posteriorly and inferiorly. There are additional bilateral low-attenuation renal masses that are consistent with cysts. No intrarenal stones. No hydronephrosis. Normal ureters. Bladder is unremarkable but partly obscured by artifact from bilateral total hip arthroplasties. Stomach/Bowel: No evidence of a colon mass. Numerous left colon diverticula mostly along the sigmoid. No evidence of diverticulitis. No colon wall thickening or other inflammatory process. Normal stomach and small bowel.  Normal appendix visualized. Vascular/Lymphatic: Mild aortic atherosclerosis. No aneurysm. Branch vessels appear widely patent. No enlarged mesenteric,  retroperitoneal or pelvic lymph nodes. Reproductive: Uterus and bilateral adnexa are unremarkable. Other: No abdominal wall hernia or abnormality. No abdominopelvic ascites. Musculoskeletal: Well positioned bilateral total hip arthroplasties. No acute fracture. No osteoblastic or osteolytic lesions. IMPRESSION: 1. No evidence of neoplastic disease within the chest, abdomen or pelvis. 2. No acute findings. 3. Left colon diverticulosis without evidence of diverticulitis. 4. Complicated left midpole renal cysts versus a complex cystic mass. Recommend nonemergent follow-up, preferably with renal MRI without and with contrast for further assessment and characterization. 5. 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 Electronically Signed   By: DLajean ManesM.D.   On: 08/05/2020 19:54       IMPRESSION/PLAN: 1. Adenocarcinoma of the rectum.Dr. MLisbeth Renshawdiscusses the pathology findings and reviews the nature of rectal cancer.  He agrees with continued work-up including MRI of the pelvis. There has been a finding on her abdominal CT in the kidney, and he agrees with further evaluation of this also by MRI, though discusses if something abnormal worsening is likely in an related process to her rectum cancer.  Dr. MLisbeth Renshawdiscussed that if a large tumor or adenopathy was identified, she would be most likely benefited by a course of chemoradiation.  That being said she is also meeting with Dr. KDelton Coombes her performance status seems quite good, and we did discuss that if she were a candidate for total neoadjuvant chemotherapy first, that we would follow-up with her several months from now.  Regarding the chemoRT, we discussed the risks,  benefits, short, and long term effects of radiotherapy, and the patient is interested in proceeding. Dr. Lisbeth Renshaw discusses the delivery and logistics of radiotherapy and anticipates a course of 5 1/2 weeks of radiotherapy.   We will follow-up with her decision making after she is seen by Dr. Delton Coombes and after her MRI imaging has resulted.   Given current concerns for patient exposure during the COVID-19 pandemic, this encounter was conducted via telephone.  The patient has provided two factor identification and has given verbal consent for this type of encounter and has been advised to only accept a meeting of this type in a secure network environment. The time spent during this encounter was 60 minutes including preparation, discussion, and coordination of the patient's care. The attendants for this meeting include Blenda Nicely, RN, Dr. Lisbeth Renshaw, Hayden Pedro  and Jean Rosenthal and her daughter Haskell Flirt.  During the encounter,  Blenda Nicely, RN, Dr. Lisbeth Renshaw, and Hayden Pedro were located at [facility] Radiation Oncology Department.  Azaria Stegman Lottman was located at home with her daughter Sharee Pimple.   The above documentation reflects my direct findings during this shared patient visit. Please see the separate note by Dr. Lisbeth Renshaw on this date for the remainder of the patient's plan of care.    Carola Rhine, PAC

## 2020-08-14 NOTE — Progress Notes (Signed)
I placed an introductory phone call to the patient today. I introduced myself and explained my role in the patient's care. I briefly explained what the patient can expect on Monday during her initial visit with Dr. Delton Coombes. The patient is grateful for my call. The patient states that she expects to have no barriers to care, stating that she has a good support system in place. Patient states that her husband and daughters Shirlean Mylar and Sharee Pimple) are prepared to assist the patient should needs arise. I provided the patient with my contact information and encouraged her to reach out to me with any questions or concerns. I explained to the patient that I will expect to meet with her Monday during her initial visit with Dr. Delton Coombes.

## 2020-08-18 ENCOUNTER — Inpatient Hospital Stay (HOSPITAL_COMMUNITY): Payer: Medicare Other

## 2020-08-18 ENCOUNTER — Encounter (HOSPITAL_COMMUNITY): Payer: Self-pay | Admitting: Hematology

## 2020-08-18 ENCOUNTER — Other Ambulatory Visit: Payer: Self-pay

## 2020-08-18 ENCOUNTER — Inpatient Hospital Stay (HOSPITAL_COMMUNITY): Payer: Medicare Other | Attending: Hematology | Admitting: Hematology

## 2020-08-18 VITALS — BP 122/85 | HR 78 | Temp 97.5°F | Resp 18 | Ht 60.5 in | Wt 175.0 lb

## 2020-08-18 DIAGNOSIS — C20 Malignant neoplasm of rectum: Secondary | ICD-10-CM

## 2020-08-18 DIAGNOSIS — K625 Hemorrhage of anus and rectum: Secondary | ICD-10-CM

## 2020-08-18 DIAGNOSIS — I10 Essential (primary) hypertension: Secondary | ICD-10-CM

## 2020-08-18 LAB — COMPREHENSIVE METABOLIC PANEL
ALT: 23 U/L (ref 0–44)
AST: 25 U/L (ref 15–41)
Albumin: 4.3 g/dL (ref 3.5–5.0)
Alkaline Phosphatase: 73 U/L (ref 38–126)
Anion gap: 6 (ref 5–15)
BUN: 23 mg/dL (ref 8–23)
CO2: 25 mmol/L (ref 22–32)
Calcium: 9.2 mg/dL (ref 8.9–10.3)
Chloride: 107 mmol/L (ref 98–111)
Creatinine, Ser: 0.99 mg/dL (ref 0.44–1.00)
GFR calc Af Amer: >60 mL/min (ref >60)
GFR calc non Af Amer: 55 mL/min — ABNORMAL LOW (ref >60)
Glucose, Bld: 101 mg/dL — ABNORMAL HIGH (ref 70–99)
Potassium: 4.4 mmol/L (ref 3.5–5.1)
Sodium: 138 mmol/L (ref 135–145)
Total Bilirubin: 0.6 mg/dL (ref 0.3–1.2)
Total Protein: 7.5 g/dL (ref 6.5–8.1)

## 2020-08-18 LAB — CBC WITH DIFFERENTIAL/PLATELET
Abs Immature Granulocytes: 0.02 10*3/uL (ref 0.00–0.07)
Basophils Absolute: 0.1 10*3/uL (ref 0.0–0.1)
Basophils Relative: 1 %
Eosinophils Absolute: 0.2 10*3/uL (ref 0.0–0.5)
Eosinophils Relative: 3 %
HCT: 45.7 % (ref 36.0–46.0)
Hemoglobin: 14.4 g/dL (ref 12.0–15.0)
Immature Granulocytes: 0 %
Lymphocytes Relative: 29 %
Lymphs Abs: 2.1 10*3/uL (ref 0.7–4.0)
MCH: 30.2 pg (ref 26.0–34.0)
MCHC: 31.5 g/dL (ref 30.0–36.0)
MCV: 95.8 fL (ref 80.0–100.0)
Monocytes Absolute: 0.6 10*3/uL (ref 0.1–1.0)
Monocytes Relative: 8 %
Neutro Abs: 4.3 10*3/uL (ref 1.7–7.7)
Neutrophils Relative %: 59 %
Platelets: 275 10*3/uL (ref 150–400)
RBC: 4.77 MIL/uL (ref 3.87–5.11)
RDW: 14.8 % (ref 11.5–15.5)
WBC: 7.3 10*3/uL (ref 4.0–10.5)
nRBC: 0 % (ref 0.0–0.2)

## 2020-08-18 NOTE — Patient Instructions (Addendum)
[place] at [facility] Discharge Instructions  You were seen and examined by Dr. Delton Coombes today. Merrilee Jansky is a medical oncologist. You were referred to Dr. Delton Coombes following abnormal findings during a colonoscopy. Dr. Delton Coombes discussed with you your past medical history, current functional status and family history of cancer.  You had a colonoscopy in 2013 in which a cancerous area was removed. At your last colonoscopy done by Dr. Gala Romney, a mass present in the rectum.  You will be scheduled for a MRI of your pelvis in addition to the MRI of your abdomen, the MRI of the pelvis will allow for Dr. Delton Coombes to assess tissue and lymph node involvement. Understanding involvement will determine the need for chemotherapy, whether you receive chemotherapy before and after surgery.  For chemotherapy, you will need a Port-A-Cath. This is the safest way to administer chemotherapy. You will most likely receive a combination of chemotherapy and radiation around surgery. It is ideal to shrink the cancer prior to surgery, this is to ensure complete cancer removal.  You will have lab work done today. We will be checking your electrolytes and blood counts. We will also check what we call a tumor marker called CEA, this is specific to colorectal cancer.   Thank you for choosing [place] at [facility] to provide your oncology and hematology care.  To afford each patient quality time with our provider, please arrive at least 15 minutes before your scheduled appointment time.   If you have a lab appointment with the [facility] please come in thru the Main Entrance and check in at the main information desk.  You need to re-schedule your appointment should you arrive 10 or more minutes late.  We strive to give you quality time with our providers, and arriving late affects you and other patients whose appointments are after yours.  Also, if you no show  three or more times for appointments you may be dismissed from the clinic at the providers discretion.     Again, thank you for choosing [facility].  Our hope is that these requests will decrease the amount of time that you wait before being seen by our physicians.       _____________________________________________________________  Should you have questions after your visit to [facility], please contact our office at [phone] and follow the prompts.  Our office hours are 8:00 a.m. and 4:30 p.m. Monday - Friday.  Please note that voicemails left after 4:00 p.m. may not be returned until the following business day.  We are closed weekends and major holidays.  You do have access to a nurse 24-7, just call the main number to the clinic [phone] and do not press any options, hold on the line and a nurse will answer the phone.    For prescription refill requests, have your pharmacy contact our office and allow 72 hours.    Due to Covid, you will need to wear a mask upon entering the hospital. If you do not have a mask, a mask will be given to you at the Main Entrance upon arrival. For doctor visits, patients may have 1 support person age 66 or older with them. For treatment visits, patients can not have anyone with them due to social distancing guidelines and our immunocompromised population.

## 2020-08-18 NOTE — Progress Notes (Signed)
[place], [place] [address]   CLINIC:  Medical Oncology/Hematology  CONSULT NOTE  Patient Care Team: Roderic Scarce, MD as PCP - General (Nurse Practitioner) [name], MD as Consulting Physician (Gastroenterology) Dishmon, Garwin Brothers, RN as Oncology Nurse Navigator (Oncology)  CHIEF COMPLAINTS/PURPOSE OF CONSULTATION:  Evaluation of rectal cancer  HISTORY OF PRESENTING ILLNESS:  [name] 74 y.o. female is here because of evaluation of rectal cancer, at the request of Dr. Leighton Ruff from [facility]. A 2.5 cm ulcerated lesion was detected 8 cm from the anal verge on colonoscopy performed on 07/10/2020. She had an invasive moderately differentiated adenocarcinomatous polyp removed via transanal polypectomy on 10/30/2012 7 mm above the anal verge. She did not receive any other treatment for the rectal cancer. She denies any unexpected weight loss, F/C, night sweats or numbness. Her appetite is good. She has not had MI's or CVA's.  Today she is accompanied by her daughter, Brenda Mclean. She denies having any hematochezia or melena recently. She had diarrhea when she contracted COVID over a year ago and hospitalized for 8 days, but the diarrhea has resolved. Her maternal aunt had breast cancer. She has not had any other cancers before. She lives at home with her husband and does all of her ADL's. She used to work as a Counselling psychologist. She is a non-smoker. She is scheduled for an MRI [facility] on 9/1.  MEDICAL HISTORY:  Past Medical History:  Diagnosis Date  . Arthritis   . COVID-19 03/2019  . GERD (gastroesophageal reflux disease)   . HLD (hyperlipidemia)   . HTN (hypertension)   . 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 Sigmoid diverticulitis 6045   Uncomplicated    SURGICAL HISTORY: Past Surgical History:  Procedure Laterality Date  . BIOPSY  07/10/2020   Procedure: BIOPSY;  Surgeon: [name], MD;  Location[facility];  Service: Endoscopy;;  . CHOLECYSTECTOMY  1998  . COLON SURGERY  10/30/2012   Dr. Audrie Lia; transanal resection of 2 cm rectal polyp; pathology revealed superficially invasive moderately differentiated adenocarcinoma arising in adenomatous polyp of distal rectum.  . COLONOSCOPY  10/24/2013   Dr. West Carbo; with propofol; small granuloma and sutures distal rectum, small polyp mid rectum removed, sigmoid diverticulosis, otherwise normal exam.  No pathology received.  Recommended colonoscopy in 5 years.  . COLONOSCOPY  09/08/2012   Dr. West Carbo; with propofol; multi lobulated friable 3 cm mass distal rectum s/p multiple biopsies, scattered sigmoid diverticulosis, otherwise normal exam.  Pathology with adenomatous polyp.  . COLONOSCOPY WITH PROPOFOL N/A 07/10/2020   Procedure: COLONOSCOPY WITH PROPOFOL;  Surgeon: [name], MD;  Location[facility];  Service: Endoscopy;  Laterality: N/A;  10:00am  . POLYPECTOMY  07/10/2020   Procedure: POLYPECTOMY;  Surgeon: [name], MD;  Location[facility];  Service: Endoscopy;;  . REPLACEMENT TOTAL HIP W/  RESURFACING IMPLANTS Bilateral   . 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 REPLACEMENT TOTAL KNEE Right 12/03/2014  . TOTAL SHOULDER ARTHROPLASTY  04/26/2018    SOCIAL HISTORY: Social History   Socioeconomic History  . Marital status: Married    Spouse name: Not on file  . Number of children: 2  . Years of education: Not on file  . Highest education level: Not on file  Occupational History    Employer: retired  Tobacco Use  . Smoking status: Never Smoker  . Smokeless tobacco: Never Used  Vaping  Use  . Vaping Use: Never used  Substance and Sexual Activity  . Alcohol use: Never  . Drug use: Never  . Sexual activity: Not Currently  Other Topics Concern  . Not on file  Social History Narrative  . Not on file   Social Determinants of Health   Financial Resource Strain: Low Risk   . Difficulty of Paying Living  Expenses: Not hard at all  Food Insecurity: No Food Insecurity  . Worried About Charity fundraiser in the Last Year: Never true  . Ran Out of Food in the Last Year: Never true  Transportation Needs: No Transportation Needs  . Lack of Transportation (Medical): No  . Lack of Transportation (Non-Medical): No  Physical Activity: Inactive  . Days of Exercise per Week: 0 days  . Minutes of Exercise per Session: 0 min  Stress: No Stress Concern Present  . Feeling of Stress : Only a little  Social Connections: Moderately Integrated  . Frequency of Communication with Friends and Family: More than three times a week  . Frequency of Social Gatherings with Friends and Family: More than three times a week  . Attends Religious Services: More than 4 times per year  . Active Member of Clubs or Organizations: No  . Attends Archivist Meetings: Never  . Marital Status: Married  Human resources officer Violence: Not At Risk  . Fear of Current or Ex-Partner: No  . Emotionally Abused: No  . Physically Abused: No  . Sexually Abused: No    FAMILY HISTORY: Family History  Problem Relation Age of Onset  . Arthritis Mother   . High blood pressure Mother   . Heart disease Father   . Breast cancer Maternal Aunt   . Stroke Maternal Grandmother   . Cancer Paternal Grandmother   . Diabetes Brother   . Colon cancer Neg Hx     ALLERGIES:  has No Known Allergies.  MEDICATIONS:  Current Outpatient Medications  Medication Sig Dispense Refill  . LORazepam (ATIVAN) 2 MG tablet Take 1 tablet (38m total) by mouth 1 hour prior to MRI scheduled for 9/1. 1 tablet 0  . celecoxib (CELEBREX) 200 MG capsule Take 200 mg by mouth daily.    . Multiple Vitamin (MULTIVITAMIN WITH MINERALS) TABS tablet Take 1 tablet by mouth daily.    .Marland Kitchenomeprazole (PRILOSEC) 20 MG capsule Take 20 mg by mouth daily.    . polyethylene glycol-electrolytes (NULYTELY) 420 g solution As directed (Patient not taking: Reported on 08/14/2020)  4000 mL 0  . simvastatin (ZOCOR) 40 MG tablet Take 40 mg by mouth at bedtime.     . valsartan (DIOVAN) 320 MG tablet Take 320 mg by mouth daily.     No current facility-administered medications for this visit.    REVIEW OF SYSTEMS:   Review of Systems  Constitutional: Negative for appetite change, chills, diaphoresis, fatigue, fever and unexpected weight change.  Gastrointestinal: Negative for blood in stool and diarrhea.  Genitourinary: Negative for hematuria.   Neurological: Negative for numbness.  Psychiatric/Behavioral: The patient is nervous/anxious.   All other systems reviewed and are negative.    PHYSICAL EXAMINATION: ECOG PERFORMANCE STATUS: 0 - Asymptomatic  Vitals:   08/18/20 1325  BP: 122/85  Pulse: 78  Resp: 18  Temp: (!) 97.5 F (36.4 C)  SpO2: 96%   Filed Weights   08/18/20 1325  Weight: 175 lb (79.4 kg)   Physical Exam Vitals reviewed.  Constitutional:      Appearance: Normal  appearance. She is obese.  Cardiovascular:     Rate and Rhythm: Normal rate and regular rhythm.     Pulses: Normal pulses.     Heart sounds: Normal heart sounds.  Pulmonary:     Effort: Pulmonary effort is normal.     Breath sounds: Normal breath sounds.  Abdominal:     Palpations: Abdomen is soft. There is no hepatomegaly, splenomegaly or mass.     Tenderness: There is no abdominal tenderness.     Hernia: No hernia is present.  Lymphadenopathy:     Cervical: No cervical adenopathy.     Upper Body:     Right upper body: No supraclavicular or axillary adenopathy.     Left upper body: No supraclavicular or axillary adenopathy.  Neurological:     General: No focal deficit present.     Mental Status: She is alert and oriented to person, place, and time.  Psychiatric:        Mood and Affect: Mood normal.        Behavior: Behavior normal.      LABORATORY DATA:  I have reviewed the data as listed No flowsheet data found. CMP Latest Ref Rng & Units 07/21/2020  Creatinine  0.60 - 0.93 mg/dL 0.97(H)   Surgical pathology ([id number]) on 08/03/20: Rectal mass biopsy: adenocarcinoma.  RADIOGRAPHIC STUDIES: I have personally reviewed the radiological images as listed and agreed with the findings in the report. CT CHEST W CONTRAST  Result Date: 08/05/2020 CLINICAL DATA:  High risk colon cancer surveillance: Personal history of colonic polyps history of superficially invasive adenocarcinoma in the rectal polyp removed over 10 years ago transanally surgery, Chronic diarrhea persist. ?Stool studies negative, Colonoscopy on August 03, 2020 EXAM: CT CHEST, ABDOMEN, AND PELVIS WITH CONTRAST TECHNIQUE: Multidetector CT imaging of the chest, abdomen and pelvis was performed following the standard protocol during bolus administration of intravenous contrast. CONTRAST:  153m OMNIPAQUE IOHEXOL 300 MG/ML  SOLN COMPARISON:  None. FINDINGS: CT CHEST FINDINGS Cardiovascular: Heart is normal in size and configuration. No pericardial effusion. No coronary artery calcifications. Great vessels are normal in caliber. Mild aortic atherosclerosis. No aneurysm or dissection. Mediastinum/Nodes: No neck base, mediastinal or hilar masses or enlarged lymph nodes. Trachea and esophagus are unremarkable. Lungs/Pleura: Mild areas of linear and peripheral reticular opacity consistent with scarring and minimal atelectasis. Mild bronchiectasis noted in the lower lobes. No lung consolidation or edema. No mass or suspicious nodule. No pleural effusion or pneumothorax. Musculoskeletal: No acute fracture. No osteoblastic or osteolytic lesions. Well-positioned left shoulder prosthesis. Degenerative changes noted throughout the thoracic spine. CT ABDOMEN PELVIS FINDINGS Hepatobiliary: No focal liver abnormality is seen. Status post cholecystectomy. No biliary dilatation. Pancreas: Unremarkable. No pancreatic ductal dilatation or surrounding inflammatory changes. Spleen: Normal in size without focal abnormality.  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 Normal right adrenal gland. Kidneys normal in size, orientation and position with symmetric enhancement and excretion. Exophytic low-density mass arises from the midpole of the left kidney, 2.9 cm in size, with some apparent mild wall thickening posteriorly and inferiorly. There are additional bilateral low-attenuation renal masses that are consistent with cysts. No intrarenal stones. No hydronephrosis. Normal ureters. Bladder is unremarkable but partly obscured by artifact from bilateral total hip arthroplasties. Stomach/Bowel: No evidence of a colon mass. Numerous left colon diverticula mostly along the sigmoid. No evidence of diverticulitis. No colon wall thickening or other inflammatory process. Normal stomach and small bowel.  Normal  appendix visualized. Vascular/Lymphatic: Mild aortic atherosclerosis. No aneurysm. Branch vessels appear widely patent. No enlarged mesenteric, retroperitoneal or pelvic lymph nodes. Reproductive: Uterus and bilateral adnexa are unremarkable. Other: No abdominal wall hernia or abnormality. No abdominopelvic ascites. Musculoskeletal: Well positioned bilateral total hip arthroplasties. No acute fracture. No osteoblastic or osteolytic lesions. IMPRESSION: 1. No evidence of neoplastic disease within the chest, abdomen or pelvis. 2. No acute findings. 3. Left colon diverticulosis without evidence of diverticulitis. 4. Complicated left midpole renal cysts versus a complex cystic mass. Recommend nonemergent follow-up, preferably with renal MRI without and with contrast for further assessment and characterization. 5. 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 Electronically Signed   By: Lajean Manes M.D.   On: 08/05/2020 19:54   CT Abdomen Pelvis W Contrast  Result Date:  08/05/2020 CLINICAL DATA:  High risk colon cancer surveillance: Personal history of colonic polyps history of superficially invasive adenocarcinoma in the rectal polyp removed over 10 years ago transanally surgery, Chronic diarrhea persist. ?Stool studies negative, Colonoscopy on 16-Jul-2020 EXAM: CT CHEST, ABDOMEN, AND PELVIS WITH CONTRAST TECHNIQUE: Multidetector CT imaging of the chest, abdomen and pelvis was performed following the standard protocol during bolus administration of intravenous contrast. CONTRAST:  157m OMNIPAQUE IOHEXOL 300 MG/ML  SOLN COMPARISON:  None. FINDINGS: CT CHEST FINDINGS Cardiovascular: Heart is normal in size and configuration. No pericardial effusion. No coronary artery calcifications. Great vessels are normal in caliber. Mild aortic atherosclerosis. No aneurysm or dissection. Mediastinum/Nodes: No neck base, mediastinal or hilar masses or enlarged lymph nodes. Trachea and esophagus are unremarkable. Lungs/Pleura: Mild areas of linear and peripheral reticular opacity consistent with scarring and minimal atelectasis. Mild bronchiectasis noted in the lower lobes. No lung consolidation or edema. No mass or suspicious nodule. No pleural effusion or pneumothorax. Musculoskeletal: No acute fracture. No osteoblastic or osteolytic lesions. Well-positioned left shoulder prosthesis. Degenerative changes noted throughout the thoracic spine. CT ABDOMEN PELVIS FINDINGS Hepatobiliary: No focal liver abnormality is seen. Status post cholecystectomy. No biliary dilatation. Pancreas: Unremarkable. No pancreatic ductal dilatation or surrounding inflammatory changes. Spleen: Normal in size without focal abnormality. 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 Normal right adrenal gland. Kidneys normal in size, orientation and position with symmetric enhancement and excretion. Exophytic low-density mass  arises from the midpole of the left kidney, 2.9 cm in size, with some apparent mild wall thickening posteriorly and inferiorly. There are additional bilateral low-attenuation renal masses that are consistent with cysts. No intrarenal stones. No hydronephrosis. Normal ureters. Bladder is unremarkable but partly obscured by artifact from bilateral total hip arthroplasties. Stomach/Bowel: No evidence of a colon mass. Numerous left colon diverticula mostly along the sigmoid. No evidence of diverticulitis. No colon wall thickening or other inflammatory process. Normal stomach and small bowel.  Normal appendix visualized. Vascular/Lymphatic: Mild aortic atherosclerosis. No aneurysm. Branch vessels appear widely patent. No enlarged mesenteric, retroperitoneal or pelvic lymph nodes. Reproductive: Uterus and bilateral adnexa are unremarkable. Other: No abdominal wall hernia or abnormality. No abdominopelvic ascites. Musculoskeletal: Well positioned bilateral total hip arthroplasties. No acute fracture. No osteoblastic or osteolytic lesions. IMPRESSION: 1. No evidence of neoplastic disease within the chest, abdomen or pelvis. 2. No acute findings. 3. Left colon diverticulosis without evidence of diverticulitis. 4. Complicated left midpole renal cysts versus a complex cystic mass. Recommend nonemergent follow-up, preferably with renal MRI without and with contrast for further assessment and characterization. 5. 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 Electronically Signed   By: Lajean Manes M.D.   On: 08/05/2020 19:54    ASSESSMENT:  1.  Rectal adenocarcinoma: -History of rectal polyp with adenocarcinoma, status post transanal resection of the polyp in November 2013. -Colonoscopy on July 10, 2020 shows 2.5 cm centrally depressed ulcerated lesion with heaped up circular margin, 8 cm from anal verge.  Distally, just above the anal verge, suture  line consistent with history of prior transanal polypectomy. -Pathology of the rectal mass consistent with adenocarcinoma.  Splenic flexure polypectomy was tubular adenoma.  Sigmoid colon biopsy was benign. -CT CAP with contrast on August 05, 2020 shows no evidence of neoplastic disease within the chest, abdomen or pelvis.  Complicated left midpole renal cyst versus complex cystic mass.  No lung masses.  2.  Social/family history: -She lives at home with her husband.  She worked as a Counselling psychologist for 47 years.  No history of smoking. -Maternal aunt had breast cancer.   PLAN:  1.  Rectal adenocarcinoma: -I have discussed colonoscopy, and pathology reports.  We also reviewed CT scan reports with the patient and her daughter. -I would recommend MSI/MMR testing.  We will also obtain a baseline CEA level. -I have also recommended pelvic MRI with and without contrast for T and N staging. -We discussed options including neoadjuvant therapy with chemoradiation versus total neoadjuvant therapy with 4 months of FOLFOX followed by long course chemoradiation therapy followed by surgery.  If the CRM is involved or threatened by MRI, we will consider total neoadjuvant therapy. -She was already evaluated by Dr. Marcello Moores and Dr. Lisbeth Renshaw. -We also talked about need for port placement.  2.  Left kidney lesion: -MRI of the abdomen was ordered.  All questions were answered. The patient knows to call the clinic with any problems, questions or concerns.    [name], MD, 08/18/20 2:00 PM  [place] [phone]   I, Milinda Antis, am acting as a scribe for Dr. Sanda Linger.  I, [name] MD, have reviewed the above documentation for accuracy and completeness, and I agree with the above.

## 2020-08-19 ENCOUNTER — Encounter (HOSPITAL_COMMUNITY): Payer: Self-pay | Admitting: General Practice

## 2020-08-19 LAB — CEA: CEA: 2.7 ng/mL (ref 0.0–4.7)

## 2020-08-19 NOTE — Progress Notes (Signed)
[facility] Initial Psychosocial Assessment Clinical Social Work  Clinical Social Work contacted by phone to assess psychosocial, emotional, mental health, and spiritual needs of the patient.   Barriers to care/review of distress screen:  - Transportation:  Do you anticipate any problems getting to appointments?  Do you have someone who can help run errands for you if you need it?  No concerns, husband or daughters drive.  Daughters like to be involved w getting information on her situation, thus they attend appointments when able.  They are comfortable with driving to/from [facility] for radiation treatment.  .   - Help at home:  What is your living situation (alone, family, other)?  If you are physically unable to care for yourself, who would you call on to help you?  Lives in husband - age 30.  Daughters are supportive.  Likes to read, cook some, take care of the house.   - Support system:  What does your support system look like?  Who would you call on if you needed some kind of practical help?  What if you needed someone to talk to for emotional support?  Good support from family and faith community - Finances:  Are you concerned about finances.  Considering returning to work?  If not, applying for disability?  No concerns about insurance or finances.  "I have great insurance" through [employer]  What is your understanding of where you are with your cancer? Its cause?  Your treatment plan and what happens next?   Newly diagnosed w rectal cancer, does not know stage.  Diagnosis was a surprise - she recently learned that she had been diagnosed w rectal cancer in the past after her last colonoscopy 8 years ago.  Per patient, she was not told of this diagnosis and did not receive any treatment.  She is optimistic because "if Ive had it for 8 years, I am still alive."    What are your worries for the future as you begin treatment for cancer?  "Im not a worrier, but I do wonder about the  stage."  "When you hear that word and you know you have it, its an ugly word."  Was reassured by meeting w radiation oncologist.  What are your hopes and priorities during your treatment? What is important to you? What are your goals for your care?  Likes to be active and able to care for her house.  CSW Summary:  Patient and family psychosocial functioning including strengths, limitations, and coping skills:  74 year old married female, newly diagnosed w rectal cancer after discovery of rectal mass discovered during colonoscopy in July 2021. She had an invasive moderately differentiated adenocarcimonatous polyp removed in 2013, no other treatment for rectal cancer at that time.  In process of testing to determine current stage and thus treatment plan. Lives w husband in [place], good support from daughters (one lives near [place] and the other lives near patient).  She is independent in all ADLs, appears to have good social support.  No concerns re finances.  CSW and patient discussed common feeling and emotions when being diagnosed with cancer, and the importance of support during treatment. CSW informed patient of the support team and support services at [facility]. CSW provided contact information and encouraged patient to call with any questions or concerns.   Identifications of barriers to care:  None noted  Availability of community resources:  None needed.    Clinical Social Worker follow up needed: No.  Edwyna Shell, LCSW Clinical Social Worker Phone:  [phone]

## 2020-08-19 NOTE — Progress Notes (Signed)
[facility] Psychosocial Distress Screening Clinical Social Work  Clinical Social Work was referred by distress screening protocol.  The patient scored a 10 on the Psychosocial Distress Thermometer which indicates severe distress. Clinical Social Worker contacted patient by phone to assess for distress and other psychosocial needs. Unable to reach patient at home or mobile numbers, left VM w information about [facility] resources and my contact information and encouragement to return call if desired.    ONCBCN DISTRESS SCREENING 08/14/2020  Screening Type Initial Screening  Distress experienced in past week (1-10) 10  Emotional problem type Nervousness/Anxiety;Adjusting to illness  Other Contact via phone    Clinical Social Worker follow up needed: Yes.    Will await patient return call.  If yes, follow up plan:  Beverely Pace, [place], [place] Social Worker Phone:  [phone] Cell:  [phone]

## 2020-08-26 ENCOUNTER — Ambulatory Visit (HOSPITAL_COMMUNITY): Payer: Medicare Other

## 2020-08-26 ENCOUNTER — Telehealth: Payer: Self-pay | Admitting: Internal Medicine

## 2020-08-26 DIAGNOSIS — F419 Anxiety disorder, unspecified: Secondary | ICD-10-CM

## 2020-08-26 MED ORDER — LORAZEPAM 2 MG PO TABS: ORAL_TABLET | ORAL | 0 refills | Status: DC

## 2020-08-26 NOTE — Telephone Encounter (Signed)
Spoke with pt. Pt has an MRI tomorrow and another MRI next week. Pt states they weren't able to do the MRI's together.

## 2020-08-26 NOTE — Telephone Encounter (Signed)
RX sent to her pharmacy 

## 2020-08-26 NOTE — Addendum Note (Signed)
Addended by: Mahala Menghini on: 08/26/2020 02:20 PM   Modules accepted: Orders

## 2020-08-26 NOTE — Telephone Encounter (Signed)
Noted. Spoke with pt and she's aware rx was sent.

## 2020-08-26 NOTE — Telephone Encounter (Signed)
She has not had the MRI yet, did she already take the Ativan?

## 2020-08-26 NOTE — Telephone Encounter (Signed)
Pt is requesting an Ativan which was prescribed by Aliene Altes, PA for pts MRI previously.

## 2020-08-26 NOTE — Telephone Encounter (Signed)
Lmom, waiting on a return call.  

## 2020-08-26 NOTE — Telephone Encounter (Signed)
Patient called asking if Aliene Altes, PA could write her another prescription for a pill that she is taking prior to her MRI. Please advise. [phone]

## 2020-08-27 ENCOUNTER — Other Ambulatory Visit: Payer: Self-pay

## 2020-08-27 ENCOUNTER — Ambulatory Visit (HOSPITAL_COMMUNITY)
Admission: RE | Admit: 2020-08-27 | Discharge: 2020-08-27 | Disposition: A | Discharge status: Home / Self Care | Payer: Medicare Other | Source: Ambulatory Visit | Attending: Gastroenterology | Admitting: Gastroenterology

## 2020-08-27 DIAGNOSIS — C20 Malignant neoplasm of rectum: Secondary | ICD-10-CM | POA: Diagnosis present

## 2020-08-27 IMAGING — MR MR PELVIS WO/W CM
12 series · 48 of 48 positions shown · non-contrast
Comparison: [DATE] chest abdomen and pelvic CTs.

CLINICAL DATA: Rectal cancer. New diagnosis. Staging. History of
transanal polypectomy.

EXAM:
MRI PELVIS WITHOUT CONTRAST
TECHNIQUE: Multiplanar multisequence MR imaging of the pelvis was performed. No
intravenous contrast was administered. Small amount of US gel was
administered per rectum to optimize tumor evaluation.

[Series 5: ax dwi_tracew · axial · 5.0mm · 1.48mm/px · z∈[-136,+9]mm · 3 of 26 slices shown (1 of 3)]
[im 1/26]
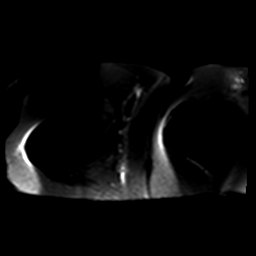
[im 13/26]
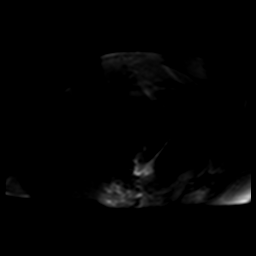
[im 26/26]
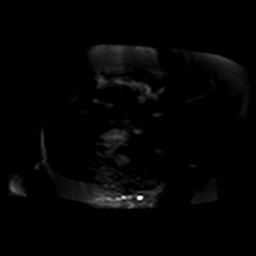

[Series 5: ax dwi_tracew · axial · 5.0mm · 1.48mm/px · z∈[-136,+9]mm · 3 of 26 slices shown (2 of 3)]
[im 1/26]
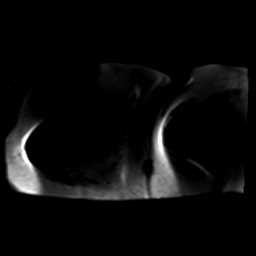
[im 13/26]
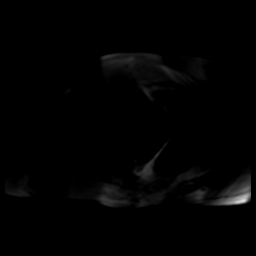
[im 26/26]
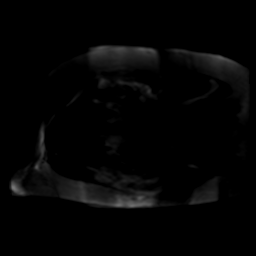

[Series 5: ax dwi_tracew · axial · 5.0mm · 1.48mm/px · z∈[-136,+9]mm · 3 of 26 slices shown (3 of 3)]
[im 1/26]
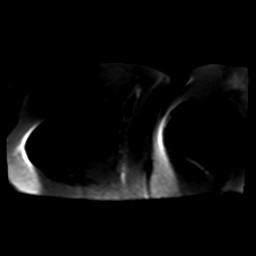
[im 13/26]
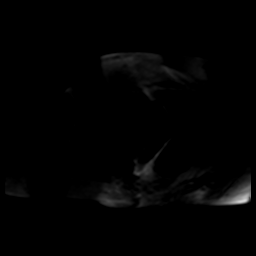
[im 26/26]
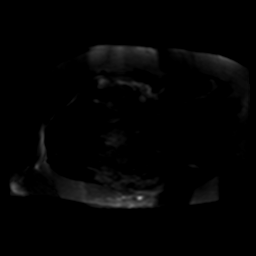

[Series 6: ax dwi_adc · axial · 5.0mm · 1.48mm/px · z∈[-136,+9]mm · 3 of 30 slices shown]
[im 1/30]
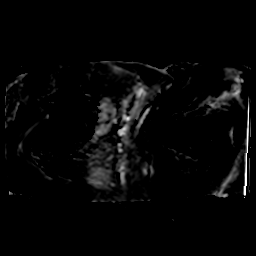
[im 15/30]
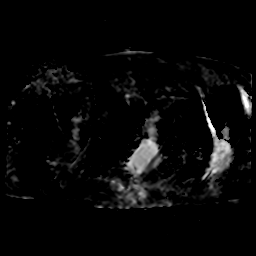
[im 30/30]
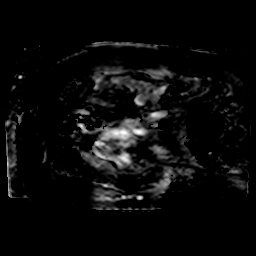

[Series 7: ax dwi_calc_bval · axial · 5.0mm · 1.48mm/px · z∈[-136,+9]mm · 3 of 27 slices shown]
[im 1/27]
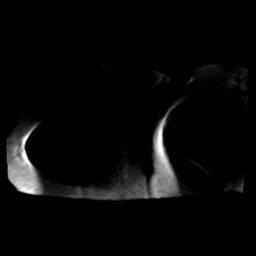
[im 14/27]
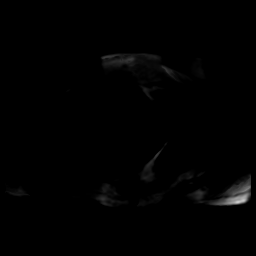
[im 27/27]
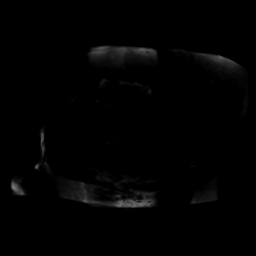

[Series 1007: T2 · sagittal · 3.0mm · 0.74mm/px · 3 of 35 slices shown (1 of 4)]
[im 1/35]
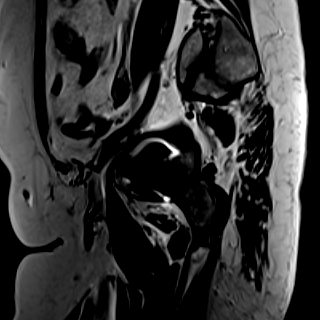
[im 18/35]
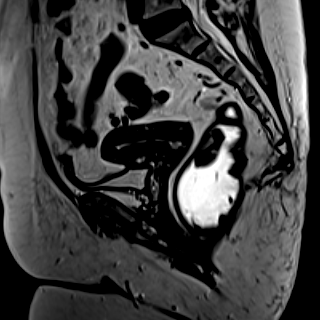
[im 35/35]
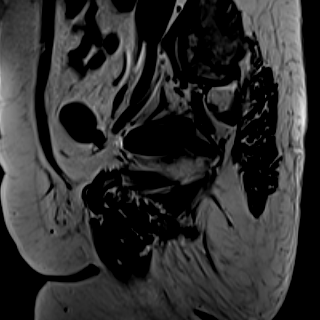

[Series 1009: T2 · axial · 5.0mm · 1.19mm/px · z∈[-160,+86]mm · 4 of 42 slices shown (2 of 4)]
[im 1/42]
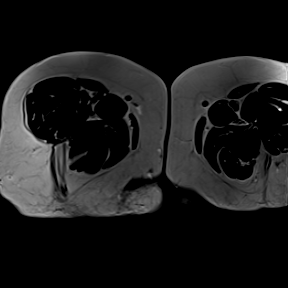
[im 14/42]
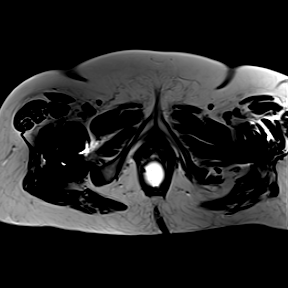
[im 28/42]
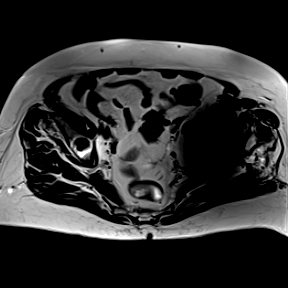
[im 42/42]
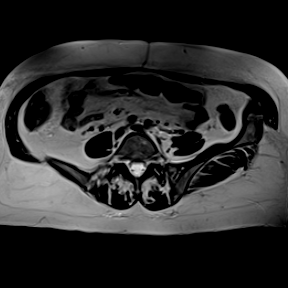

[Series 1022: T2 · axial · 3.0mm · 0.70mm/px · z∈[-85,+43]mm · 4 of 45 slices shown (3 of 4)]
[im 1/45]
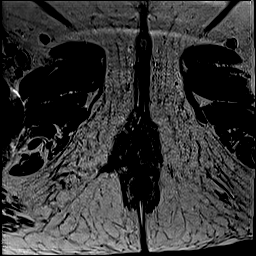
[im 15/45]
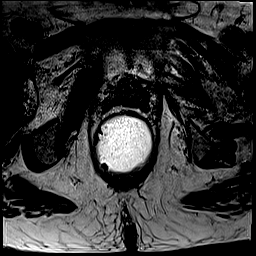
[im 30/45]
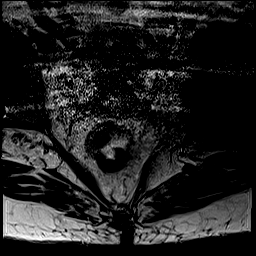
[im 45/45]
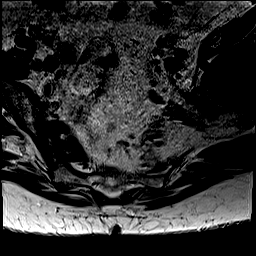

[Series 1024: T2 · coronal · 3.0mm · 0.70mm/px · 4 of 45 slices shown (4 of 4)]
[im 1/45]
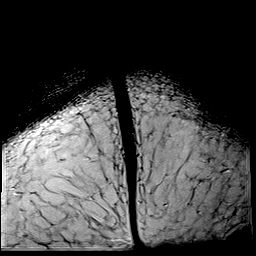
[im 15/45]
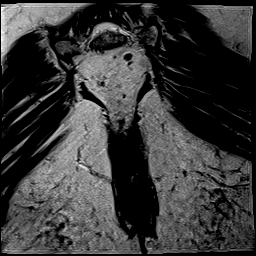
[im 30/45]
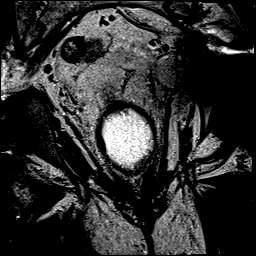
[im 45/45]
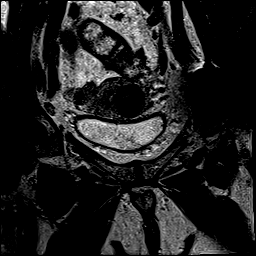

[Series 1026: T1 dynamic · axial · non-contrast · 4.0mm · 1.05mm/px · z∈[-127,+109]mm · 6 of 60 slices shown]
[im 1/60]
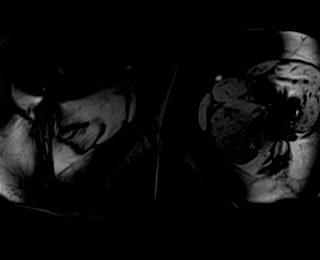
[im 12/60]
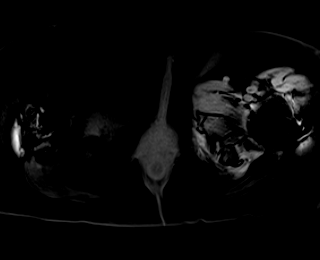
[im 24/60]
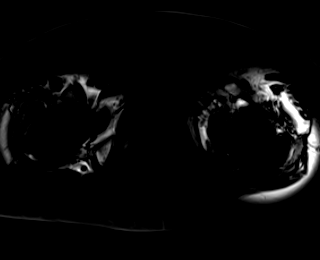
[im 36/60]
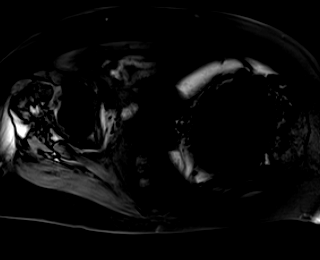
[im 48/60]
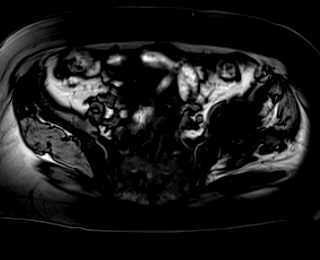
[im 60/60]
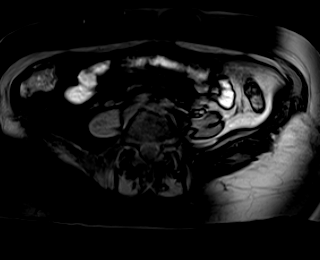

[Series 1028: T1 dynamic post-contrast · axial · 4.0mm · 1.00mm/px · z∈[-127,+109]mm · 6 of 60 slices shown]
[im 1/60]
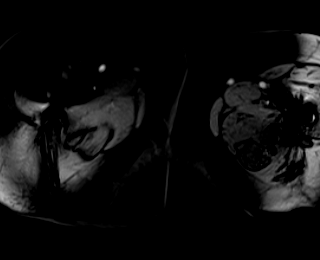
[im 12/60]
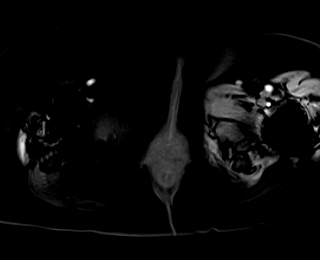
[im 24/60]
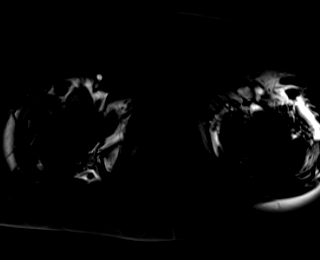
[im 36/60]
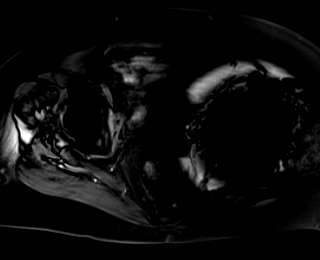
[im 48/60]
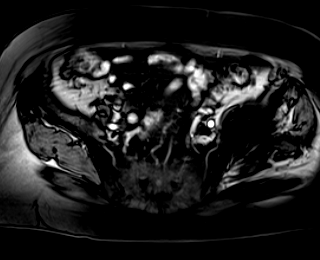
[im 60/60]
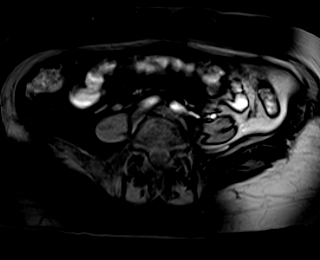

[Series 1030: T1 dynamic fat-sat post-contrast · axial · 4.0mm · 1.03mm/px · z∈[-127,+109]mm · 6 of 60 slices shown]
[im 1/60]
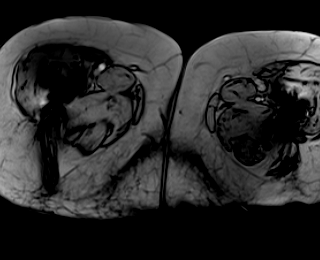
[im 12/60]
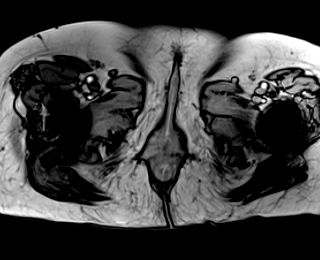
[im 24/60]
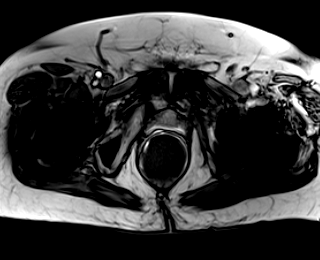
[im 36/60]
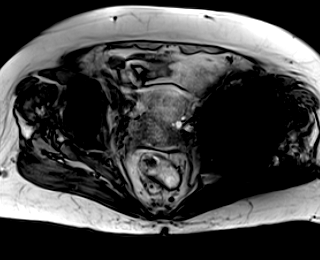
[im 48/60]
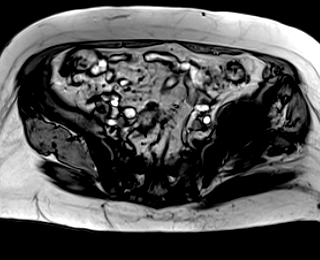
[im 60/60]
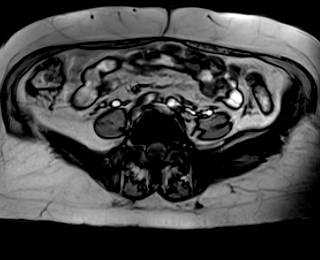

[48 of 48 positions shown; findings below may reference images not displayed]

Colonoscopy
report of [DATE]. This describes a 2.5 cm lesion 8 cm from the
anal verge.
FINDINGS: Moderate motion and susceptibility artifact degradation from
patient's bilateral hip arthroplasties. Per technologist comments,
the patient was severely sedated, unable to follow directions.

TUMOR LOCATION

Tumor distance from Anal Verge/Skin Surface: Approximately 8.4 cm,
including on [DATE],007.

Tumor distance to Internal Anal Sphincter: Approximately 4.2 cm

TUMOR DESCRIPTION

Circumferential Extent: Posteriorly positioned, centered at
approximately the 6 o'clock location, including on 20/1,009 and

Tumor Length: 2.2 cm craniocaudal on [DATE],007. 2.2 cm transverse on

T - CATEGORY

Extension through Muscularis Propria: Minimal extension through the
muscularis propria is suspected, including on images 14 and 16 of

Shortest Distance of any tumor/node from Mesorectal Fascia: 1.0 cm
on 15/[YW].

Extramural Vascular Invasion/Tumor Thrombus: No

Invasion of Anterior Peritoneal Reflection: No

Involvement of Adjacent Organs or Pelvic Sidewall: No

Levator Ani Involvement: No

N - CATEGORY

Mesorectal Lymph Nodes : No. 3 mm node at the 4 o'clock position on
16/1,009 is not pathologic by size criteria.

Extra-mesorectal Lymphadenopathy: No

Other: No significant free fluid. Extensive sigmoid diverticulosis.
1.9 cm left-sided uterine fibroid.
IMPRESSION: Moderate motion and artifact degradation.

Rectal adenocarcinoma T stage: T3b

Rectal adenocarcinoma N stage:  N0

Distance from tumor to the internal anal sphincter is 4.2 cm.

## 2020-08-27 MED ORDER — GADOBUTROL 1 MMOL/ML IV SOLN
7.0000 mL | Freq: Once | INTRAVENOUS | Status: AC | PRN
  Administered 2020-08-27: 7 mL via INTRAVENOUS

## 2020-09-02 ENCOUNTER — Other Ambulatory Visit: Payer: Self-pay

## 2020-09-02 ENCOUNTER — Encounter (HOSPITAL_COMMUNITY): Payer: Self-pay | Admitting: Hematology

## 2020-09-02 ENCOUNTER — Inpatient Hospital Stay (HOSPITAL_COMMUNITY): Payer: Medicare Other | Attending: Hematology | Admitting: Hematology

## 2020-09-02 VITALS — BP 129/82 | HR 73 | Temp 97.3°F | Resp 18 | Wt 175.4 lb

## 2020-09-02 DIAGNOSIS — Z803 Family history of malignant neoplasm of breast: Secondary | ICD-10-CM | POA: Insufficient documentation

## 2020-09-02 DIAGNOSIS — Z8249 Family history of ischemic heart disease and other diseases of the circulatory system: Secondary | ICD-10-CM | POA: Insufficient documentation

## 2020-09-02 DIAGNOSIS — Z809 Family history of malignant neoplasm, unspecified: Secondary | ICD-10-CM | POA: Insufficient documentation

## 2020-09-02 DIAGNOSIS — I700 Atherosclerosis of aorta: Secondary | ICD-10-CM | POA: Insufficient documentation

## 2020-09-02 DIAGNOSIS — N289 Disorder of kidney and ureter, unspecified: Secondary | ICD-10-CM | POA: Insufficient documentation

## 2020-09-02 DIAGNOSIS — Z833 Family history of diabetes mellitus: Secondary | ICD-10-CM | POA: Insufficient documentation

## 2020-09-02 DIAGNOSIS — Z79899 Other long term (current) drug therapy: Secondary | ICD-10-CM | POA: Insufficient documentation

## 2020-09-02 DIAGNOSIS — Z5111 Encounter for antineoplastic chemotherapy: Secondary | ICD-10-CM | POA: Insufficient documentation

## 2020-09-02 DIAGNOSIS — Z8261 Family history of arthritis: Secondary | ICD-10-CM | POA: Diagnosis not present

## 2020-09-02 DIAGNOSIS — C20 Malignant neoplasm of rectum: Secondary | ICD-10-CM | POA: Insufficient documentation

## 2020-09-02 DIAGNOSIS — Z823 Family history of stroke: Secondary | ICD-10-CM | POA: Diagnosis not present

## 2020-09-02 NOTE — Progress Notes (Signed)
START ON PATHWAY REGIMEN - Colorectal     A cycle is every 14 days:     Oxaliplatin      Leucovorin      Fluorouracil      Fluorouracil   **Always confirm dose/schedule in your pharmacy ordering system**  Patient Characteristics: Preoperative or Nonsurgical Candidate (Clinical Staging), Rectal, cT3 - cT4, cN0 or Any cT, cN+ Tumor Location: Rectal Therapeutic Status: Preoperative or Nonsurgical Candidate (Clinical Staging) AJCC T Category: cT3 AJCC N Category: cN0 AJCC M Category: cM0 AJCC 8 Stage Grouping: IIA Intent of Therapy: Curative Intent, Discussed with Patient

## 2020-09-02 NOTE — Progress Notes (Signed)
[name], [place] [address]   CLINIC:  Medical Oncology/Hematology  PCP:  Roderic Scarce, MD [address] [name] / [place] [address] [phone]   REASON FOR VISIT:  Follow-up for rectal cancer  PRIOR THERAPY: None  NGS Results: Not done  CURRENT THERAPY: Under work-up  BRIEF ONCOLOGIC HISTORY:  Oncology History   No history exists.    CANCER STAGING: Cancer Staging No matching staging information was found for the patient.  INTERVAL HISTORY:  Brenda Mclean, a 74 y.o. female, returns for routine follow-up of Brenda Mclean rectal cancer. Brenda Mclean was last seen on 08/18/2020.  Today Brenda Mclean is accompanied by Brenda Mclean daughter. Brenda Mclean denies having any rectal bleeding or changes in Brenda Mclean BM's. Brenda Mclean denies having any numbness or tingling.   Brenda Mclean saw Dr. Marcello Moores last on 8/17. Brenda Mclean has not seen a surgeon yet for port placement.   REVIEW OF SYSTEMS:  Review of Systems  Constitutional: Negative for appetite change and fatigue.  Gastrointestinal: Negative for blood in stool.  Neurological: Negative for numbness.  All other systems reviewed and are negative.   PAST MEDICAL/SURGICAL HISTORY:  Past Medical History:  Diagnosis Date  . Arthritis   . COVID-19 03/2019  . GERD (gastroesophageal reflux disease)   . HLD (hyperlipidemia)   . HTN (hypertension)   . Rectal adenocarcinoma ([place]) 11/2012  . Sigmoid diverticulitis 2878   Uncomplicated   Past Surgical History:  Procedure Laterality Date  . BIOPSY  07/10/2020   Procedure: BIOPSY;  Surgeon: [name], MD;  [facility]: [facility];  Service: Endoscopy;;  . CHOLECYSTECTOMY  1998  . COLON SURGERY  10/30/2012   Dr. Audrie Lia; transanal resection of 2 cm rectal polyp; pathology revealed superficially invasive moderately differentiated adenocarcinoma arising in adenomatous polyp of distal rectum.  . COLONOSCOPY  10/24/2013   Dr. West Carbo; with propofol; small granuloma and sutures distal rectum,  small polyp mid rectum removed, sigmoid diverticulosis, otherwise normal exam.  No pathology received.  Recommended colonoscopy in 5 years.  . COLONOSCOPY  09/08/2012   Dr. West Carbo; with propofol; multi lobulated friable 3 cm mass distal rectum s/p multiple biopsies, scattered sigmoid diverticulosis, otherwise normal exam.  Pathology with adenomatous polyp.  . COLONOSCOPY WITH PROPOFOL N/A 07/10/2020   Procedure: COLONOSCOPY WITH PROPOFOL;  Surgeon: [name], MD;  [facility];  Service: Endoscopy;  Laterality: N/A;  10:00am  . POLYPECTOMY  07/10/2020   Procedure: POLYPECTOMY;  Surgeon: [name], MD;  [facility];  Service: Endoscopy;;  . REPLACEMENT TOTAL HIP W/  RESURFACING IMPLANTS Bilateral   . 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 REPLACEMENT TOTAL KNEE Right 12/03/2014  . TOTAL SHOULDER ARTHROPLASTY  04/26/2018    SOCIAL HISTORY:  Social History   Socioeconomic History  . Marital status: Married    Spouse name: Not on file  . Number of children: 2  . Years of education: Not on file  . Highest education level: Not on file  Occupational History    Employer: retired  Tobacco Use  . Smoking status: Never Smoker  . Smokeless tobacco: Never Used  Vaping Use  . Vaping Use: Never used  Substance and Sexual Activity  . Alcohol use: Never  . Drug use: Never  . Sexual activity: Not Currently  Other Topics Concern  . Not on file  Social History Narrative  . Not on file   Social Determinants of Health   Financial Resource  Strain: Low Risk   . Difficulty of Paying Living Expenses: Not hard at all  Food Insecurity: No Food Insecurity  . Worried About Charity fundraiser in the Last Year: Never true  . Ran Out of Food in the Last Year: Never true  Transportation Needs: No Transportation Needs  . Lack of Transportation (Medical): No  . Lack of Transportation (Non-Medical): No  Physical Activity: Inactive  . Days of Exercise per Week: 0  days  . Minutes of Exercise per Session: 0 min  Stress: No Stress Concern Present  . Feeling of Stress : Only a little  Social Connections: Moderately Integrated  . Frequency of Communication with Friends and Family: More than three times a week  . Frequency of Social Gatherings with Friends and Family: More than three times a week  . Attends Religious Services: More than 4 times per year  . Active Member of Clubs or Organizations: No  . Attends Archivist Meetings: Never  . Marital Status: Married  Human resources officer Violence: Not At Risk  . Fear of Current or Ex-Partner: No  . Emotionally Abused: No  . Physically Abused: No  . Sexually Abused: No    FAMILY HISTORY:  Family History  Problem Relation Age of Onset  . Arthritis Mother   . High blood pressure Mother   . Heart disease Father   . Breast cancer Maternal Aunt   . Stroke Maternal Grandmother   . Cancer Paternal Grandmother   . Diabetes Brother   . Colon cancer Neg Hx     CURRENT MEDICATIONS:  Current Outpatient Medications  Medication Sig Dispense Refill  . celecoxib (CELEBREX) 200 MG capsule Take 200 mg by mouth daily.    Marland Kitchen LORazepam (ATIVAN) 2 MG tablet Take 1 tablet (2mg  total) by mouth 1 hour prior to MRI scheduled for 9/1. May cause drowsiness. 1 tablet 0  . Multiple Vitamin (MULTIVITAMIN WITH MINERALS) TABS tablet Take 1 tablet by mouth daily.    Marland Kitchen omeprazole (PRILOSEC) 20 MG capsule Take 20 mg by mouth daily.    . polyethylene glycol-electrolytes (NULYTELY) 420 g solution As directed (Patient not taking: Reported on 08/14/2020) 4000 mL 0  . simvastatin (ZOCOR) 40 MG tablet Take 40 mg by mouth at bedtime.     . valsartan (DIOVAN) 320 MG tablet Take 320 mg by mouth daily.     No current facility-administered medications for this visit.    ALLERGIES:  No Known Allergies  PHYSICAL EXAM:  Performance status (ECOG): 0 - Asymptomatic  There were no vitals filed for this visit. Wt Readings from Last  3 Encounters:  08/18/20 175 lb (79.4 kg)  08/14/20 170 lb (77.1 kg)  07/10/20 170 lb (77.1 kg)   Physical Exam Vitals reviewed.  Constitutional:      Appearance: Normal appearance. Brenda Mclean is obese.  Cardiovascular:     Rate and Rhythm: Normal rate and regular rhythm.     Pulses: Normal pulses.     Heart sounds: Normal heart sounds.  Pulmonary:     Effort: Pulmonary effort is normal.     Breath sounds: Normal breath sounds.  Abdominal:     Palpations: Abdomen is soft. There is no hepatomegaly, splenomegaly or mass.     Tenderness: There is no abdominal tenderness.     Hernia: No hernia is present.  Musculoskeletal:     Right lower leg: No edema.     Left lower leg: No edema.  Neurological:     General:  No focal deficit present.     Mental Status: Brenda Mclean is alert and oriented to person, place, and time.  Psychiatric:        Mood and Affect: Mood normal.        Behavior: Behavior normal.      LABORATORY DATA:  I have reviewed the labs as listed.  CBC Latest Ref Rng & Units 08/18/2020  WBC 4.0 - 10.5 K/uL 7.3  Hemoglobin 12.0 - 15.0 g/dL 14.4  Hematocrit 36 - 46 % 45.7  Platelets 150 - 400 K/uL 275   CMP Latest Ref Rng & Units 08/18/2020 07/21/2020  Glucose 70 - 99 mg/dL 101(H) -  BUN 8 - 23 mg/dL 23 -  Creatinine 0.44 - 1.00 mg/dL 0.99 0.97(H)  Sodium 135 - 145 mmol/L 138 -  Potassium 3.5 - 5.1 mmol/L 4.4 -  Chloride 98 - 111 mmol/L 107 -  CO2 22 - 32 mmol/L 25 -  Calcium 8.9 - 10.3 mg/dL 9.2 -  Total Protein 6.5 - 8.1 g/dL 7.5 -  Total Bilirubin 0.3 - 1.2 mg/dL 0.6 -  Alkaline Phos 38 - 126 U/L 73 -  AST 15 - 41 U/L 25 -  ALT 0 - 44 U/L 23 -   Lab Results  Component Value Date   CEA1 2.7 08/18/2020    DIAGNOSTIC IMAGING:  I have independently reviewed the scans and discussed with the patient. CT CHEST W CONTRAST  Result Date: 08/05/2020 CLINICAL DATA:  High risk colon cancer surveillance: Personal history of colonic polyps history of superficially invasive  adenocarcinoma in the rectal polyp removed over 10 years ago transanally surgery, Chronic diarrhea persist. ?Stool studies negative, Colonoscopy on 07-21-2020 EXAM: CT CHEST, ABDOMEN, AND PELVIS WITH CONTRAST TECHNIQUE: Multidetector CT imaging of the chest, abdomen and pelvis was performed following the standard protocol during bolus administration of intravenous contrast. CONTRAST:  167mL OMNIPAQUE IOHEXOL 300 MG/ML  SOLN COMPARISON:  None. FINDINGS: CT CHEST FINDINGS Cardiovascular: Heart is normal in size and configuration. No pericardial effusion. No coronary artery calcifications. Great vessels are normal in caliber. Mild aortic atherosclerosis. No aneurysm or dissection. Mediastinum/Nodes: No neck base, mediastinal or hilar masses or enlarged lymph nodes. Trachea and esophagus are unremarkable. Lungs/Pleura: Mild areas of linear and peripheral reticular opacity consistent with scarring and minimal atelectasis. Mild bronchiectasis noted in the lower lobes. No lung consolidation or edema. No mass or suspicious nodule. No pleural effusion or pneumothorax. Musculoskeletal: No acute fracture. No osteoblastic or osteolytic lesions. Well-positioned left shoulder prosthesis. Degenerative changes noted throughout the thoracic spine. CT ABDOMEN PELVIS FINDINGS Hepatobiliary: No focal liver abnormality is seen. Status post cholecystectomy. No biliary dilatation. Pancreas: Unremarkable. No pancreatic ductal dilatation or surrounding inflammatory changes. Spleen: Normal in size without focal abnormality. 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 Normal right adrenal gland. Kidneys normal in size, orientation and position with symmetric enhancement and excretion. Exophytic low-density mass arises from the midpole of the left kidney, 2.9 cm in size, with some apparent mild wall thickening posteriorly and inferiorly. There are  additional bilateral low-attenuation renal masses that are consistent with cysts. No intrarenal stones. No hydronephrosis. Normal ureters. Bladder is unremarkable but partly obscured by artifact from bilateral total hip arthroplasties. Stomach/Bowel: No evidence of a colon mass. Numerous left colon diverticula mostly along the sigmoid. No evidence of diverticulitis. No colon wall thickening or other inflammatory process. Normal stomach and small bowel.  Normal appendix visualized. Vascular/Lymphatic:  Mild aortic atherosclerosis. No aneurysm. Branch vessels appear widely patent. No enlarged mesenteric, retroperitoneal or pelvic lymph nodes. Reproductive: Uterus and bilateral adnexa are unremarkable. Other: No abdominal wall hernia or abnormality. No abdominopelvic ascites. Musculoskeletal: Well positioned bilateral total hip arthroplasties. No acute fracture. No osteoblastic or osteolytic lesions. IMPRESSION: 1. No evidence of neoplastic disease within the chest, abdomen or pelvis. 2. No acute findings. 3. Left colon diverticulosis without evidence of diverticulitis. 4. Complicated left midpole renal cysts versus a complex cystic mass. Recommend nonemergent follow-up, preferably with renal MRI without and with contrast for further assessment and characterization. 5. 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 Electronically Signed   By: Lajean Manes M.D.   On: 08/05/2020 19:54   MR PELVIS W WO CONTRAST  Result Date: 08/27/2020 CLINICAL DATA:  Rectal cancer. New diagnosis. Staging. History of transanal polypectomy. EXAM: MRI PELVIS WITHOUT CONTRAST TECHNIQUE: Multiplanar multisequence MR imaging of the pelvis was performed. No intravenous contrast was administered. Small amount of [place] gel was administered per rectum to optimize tumor evaluation. COMPARISON:  08/05/2020 chest abdomen and pelvic CTs. Colonoscopy report of 2020-07-27. This  describes a 2.5 cm lesion 8 cm from the anal verge. FINDINGS: Moderate motion and susceptibility artifact degradation from patient's bilateral hip arthroplasties. Per technologist comments, the patient was severely sedated, unable to follow directions. TUMOR LOCATION Tumor distance from Anal Verge/Skin Surface: Approximately 8.4 cm, including on 23/1,007. Tumor distance to Internal Anal Sphincter: Approximately 4.2 cm TUMOR DESCRIPTION Circumferential Extent: Posteriorly positioned, centered at approximately the 6 o'clock location, including on 20/1,009 and 16/1022. Tumor Length: 2.2 cm craniocaudal on 20/1,007. 2.2 cm transverse on 20/1,009 T - CATEGORY Extension through Muscularis Propria: Minimal extension through the muscularis propria is suspected, including on images 14 and 16 of 1,022. T3b Shortest Distance of any tumor/node from Mesorectal Fascia: 1.0 cm on 15/1022. Extramural Vascular Invasion/Tumor Thrombus: No Invasion of Anterior Peritoneal Reflection: No Involvement of Adjacent Organs or Pelvic Sidewall: No Levator Ani Involvement: No N - CATEGORY Mesorectal Lymph Nodes : No. 3 mm node at the 4 o'clock position on 16/1,009 is not pathologic by size criteria. Extra-mesorectal Lymphadenopathy: No Other: No significant free fluid. Extensive sigmoid diverticulosis. 1.9 cm left-sided uterine fibroid. IMPRESSION: Moderate motion and artifact degradation. Rectal adenocarcinoma T stage: T3b Rectal adenocarcinoma N stage:  N0 Distance from tumor to the internal anal sphincter is 4.2 cm. Electronically Signed   By: Abigail Miyamoto M.D.   On: 08/27/2020 16:19   CT Abdomen Pelvis W Contrast  Result Date: 08/05/2020 CLINICAL DATA:  High risk colon cancer surveillance: Personal history of colonic polyps history of superficially invasive adenocarcinoma in the rectal polyp removed over 10 years ago transanally surgery, Chronic diarrhea persist. ?Stool studies negative, Colonoscopy on 07-27-2020 EXAM: CT CHEST, ABDOMEN,  AND PELVIS WITH CONTRAST TECHNIQUE: Multidetector CT imaging of the chest, abdomen and pelvis was performed following the standard protocol during bolus administration of intravenous contrast. CONTRAST:  168mL OMNIPAQUE IOHEXOL 300 MG/ML  SOLN COMPARISON:  None. FINDINGS: CT CHEST FINDINGS Cardiovascular: Heart is normal in size and configuration. No pericardial effusion. No coronary artery calcifications. Great vessels are normal in caliber. Mild aortic atherosclerosis. No aneurysm or dissection. Mediastinum/Nodes: No neck base, mediastinal or hilar masses or enlarged lymph nodes. Trachea and esophagus are unremarkable. Lungs/Pleura: Mild areas of linear and peripheral reticular opacity consistent with scarring and minimal atelectasis. Mild bronchiectasis noted in the lower lobes. No lung consolidation or edema. No  mass or suspicious nodule. No pleural effusion or pneumothorax. Musculoskeletal: No acute fracture. No osteoblastic or osteolytic lesions. Well-positioned left shoulder prosthesis. Degenerative changes noted throughout the thoracic spine. CT ABDOMEN PELVIS FINDINGS Hepatobiliary: No focal liver abnormality is seen. Status post cholecystectomy. No biliary dilatation. Pancreas: Unremarkable. No pancreatic ductal dilatation or surrounding inflammatory changes. Spleen: Normal in size without focal abnormality. 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 Normal right adrenal gland. Kidneys normal in size, orientation and position with symmetric enhancement and excretion. Exophytic low-density mass arises from the midpole of the left kidney, 2.9 cm in size, with some apparent mild wall thickening posteriorly and inferiorly. There are additional bilateral low-attenuation renal masses that are consistent with cysts. No intrarenal stones. No hydronephrosis. Normal ureters. Bladder is unremarkable but partly  obscured by artifact from bilateral total hip arthroplasties. Stomach/Bowel: No evidence of a colon mass. Numerous left colon diverticula mostly along the sigmoid. No evidence of diverticulitis. No colon wall thickening or other inflammatory process. Normal stomach and small bowel.  Normal appendix visualized. Vascular/Lymphatic: Mild aortic atherosclerosis. No aneurysm. Branch vessels appear widely patent. No enlarged mesenteric, retroperitoneal or pelvic lymph nodes. Reproductive: Uterus and bilateral adnexa are unremarkable. Other: No abdominal wall hernia or abnormality. No abdominopelvic ascites. Musculoskeletal: Well positioned bilateral total hip arthroplasties. No acute fracture. No osteoblastic or osteolytic lesions. IMPRESSION: 1. No evidence of neoplastic disease within the chest, abdomen or pelvis. 2. No acute findings. 3. Left colon diverticulosis without evidence of diverticulitis. 4. Complicated left midpole renal cysts versus a complex cystic mass. Recommend nonemergent follow-up, preferably with renal MRI without and with contrast for further assessment and characterization. 5. 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 Electronically Signed   By: Lajean Manes M.D.   On: 08/05/2020 19:54     ASSESSMENT:  1.  Rectal adenocarcinoma: -History of rectal polyp with adenocarcinoma, status post transanal resection of the polyp in November 2013. -Colonoscopy on July 10, 2020 shows 2.5 cm centrally depressed ulcerated lesion with heaped up circular margin, 8 cm from anal verge.  Distally, just above the anal verge, suture line consistent with history of prior transanal polypectomy. -Pathology of the rectal mass consistent with adenocarcinoma.  Splenic flexure polypectomy was tubular adenoma.  Sigmoid colon biopsy was benign. -CT CAP with contrast on August 05, 2020 shows no evidence of neoplastic disease within the chest,  abdomen or pelvis.  Complicated left midpole renal cyst versus complex cystic mass.  No lung masses. -Pelvis MRI on 08/27/2020 showed T3BN0 tumor.  Distance of tumor/node from mesorectal fascia was 1 cm.  Distance from tumor to the internal anal sphincter was 4.2 cm.  Tumor size is 2.2 x 2.2 cm. -Total neoadjuvant therapy with 4 months of FOLFOX followed by long course chemo RT with Xeloda followed by restaging pelvis MRI 6 to 8 weeks after completion of XRT followed by resection recommended.  2.  Social/family history: -Brenda Mclean lives at home with Brenda Mclean husband.  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  No history of smoking. -Maternal aunt had breast cancer.   PLAN:  1.    Stage II (T3N0) rectal adenocarcinoma: -We reviewed the results of the pelvic MRI.  We also reviewed CEA level which was normal at 2.7.  I have also called the radiologist and discussed the findings of the pelvis MRI.  He has confirmed that circumferential resection  margin is clear. -Based on NCCN guidelines, I have recommended total neoadjuvant therapy with 4 months of FOLFOX followed by long course chemo RT with Xeloda.  This will be followed by restaging pelvis MRI 6 to 8 weeks after completion of XRT followed by resection. -We discussed the schedule of chemotherapy with FOLFOX every 2 weeks.  We also discussed common side effects. -We will make a referral for port placement. -We will see Brenda Mclean back after port placement to initiate therapy.  2.  Left kidney lesion: -Brenda Mclean will soon have MRI of the abdomen to follow-up on this.   Orders placed this encounter:  No orders of the defined types were placed in this encounter.  Total time spent is 40 minutes with more than 50% of the time spent face-to-face discussing scan results, further treatment plan, side effects, counseling and coordination of care.  [name], MD [facility] [phone]   I, Milinda Antis, am acting as a scribe for Dr. Sanda Linger.  I, [name] MD, have reviewed the above documentation for accuracy and completeness, and I agree with the above.

## 2020-09-02 NOTE — Patient Instructions (Signed)
[place] at [facility] Discharge Instructions  You were seen today by Dr. Delton Coombes. He went over your recent results and scans; your cancer is currently staged at T3b N0. The most likely chemo treatment will be FOLFOX every 2 weeks for 4 months before you proceed to chemoradiation for 6 weeks. You will be referred for chemotherapy training. You will also be referred to a general surgeon for port placement. Dr. Delton Coombes will see you back after your port is placed for labs and follow up.   Thank you for choosing [place] at [facility] to provide your oncology and hematology care.  To afford each patient quality time with our provider, please arrive at least 15 minutes before your scheduled appointment time.   If you have a lab appointment with the [facility] please come in thru the Main Entrance and check in at the main information desk  You need to re-schedule your appointment should you arrive 10 or more minutes late.  We strive to give you quality time with our providers, and arriving late affects you and other patients whose appointments are after yours.  Also, if you no show three or more times for appointments you may be dismissed from the clinic at the providers discretion.     Again, thank you for choosing [facility].  Our hope is that these requests will decrease the amount of time that you wait before being seen by our physicians.       _____________________________________________________________  Should you have questions after your visit to [facility], please contact our office at [phone] between the hours of 8:00 a.m. and 4:30 p.m.  Voicemails left after 4:00 p.m. will not be returned until the following business day.  For prescription refill requests, have your pharmacy contact our office and allow 72 hours.    Cancer Center Support Programs:   > Cancer Support Group  2nd Tuesday of the month  1pm-2pm, Journey Room

## 2020-09-04 ENCOUNTER — Other Ambulatory Visit: Payer: Self-pay

## 2020-09-04 ENCOUNTER — Encounter: Payer: Self-pay | Admitting: General Surgery

## 2020-09-04 ENCOUNTER — Ambulatory Visit (INDEPENDENT_AMBULATORY_CARE_PROVIDER_SITE_OTHER): Payer: Medicare Other | Admitting: General Surgery

## 2020-09-04 VITALS — BP 133/83 | HR 67 | Temp 98.4°F | Resp 16 | Ht 60.05 in | Wt 175.0 lb

## 2020-09-04 DIAGNOSIS — C20 Malignant neoplasm of rectum: Secondary | ICD-10-CM

## 2020-09-04 LAB — SURGICAL PATHOLOGY

## 2020-09-04 NOTE — H&P (Signed)
Brenda Mclean; [id number]; [birth year]   HPI Patient is a 74 year old white female who was referred to my care by Dr. Delton Coombes for Port-A-Cath insertion.  She was recently diagnosed with rectal cancer and is about to undergo chemotherapy. Past Medical History:  Diagnosis Date  . Arthritis   . COVID-19 03/2019  . GERD (gastroesophageal reflux disease)   . HLD (hyperlipidemia)   . HTN (hypertension)   . 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 Sigmoid diverticulitis 7628   Uncomplicated    Past Surgical History:  Procedure Laterality Date  . BIOPSY  07/10/2020   Procedure: BIOPSY;  Surgeon: [name], MD;  Location: [facility];  Service: Endoscopy;;  . CHOLECYSTECTOMY  1998  . COLON SURGERY  10/30/2012   Dr. Audrie Lia; transanal resection of 2 cm rectal polyp; pathology revealed superficially invasive moderately differentiated adenocarcinoma arising in adenomatous polyp of distal rectum.  . COLONOSCOPY  10/24/2013   Dr. West Carbo; with propofol; small granuloma and sutures distal rectum, small polyp mid rectum removed, sigmoid diverticulosis, otherwise normal exam.  No pathology received.  Recommended colonoscopy in 5 years.  . COLONOSCOPY  09/08/2012   Dr. West Carbo; with propofol; multi lobulated friable 3 cm mass distal rectum s/p multiple biopsies, scattered sigmoid diverticulosis, otherwise normal exam.  Pathology with adenomatous polyp.  . COLONOSCOPY WITH PROPOFOL N/A 07/10/2020   Procedure: COLONOSCOPY WITH PROPOFOL;  Surgeon: [name], MD;  [facility]: [facility];  Service: Endoscopy;  Laterality: N/A;  10:00am  . POLYPECTOMY  07/10/2020   Procedure: POLYPECTOMY;  Surgeon: [name], MD;  [facility]: [facility];  Service: Endoscopy;;  . REPLACEMENT TOTAL HIP W/  RESURFACING IMPLANTS Bilateral   . 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 REPLACEMENT TOTAL KNEE Right 12/03/2014  . TOTAL SHOULDER ARTHROPLASTY  04/26/2018    Family History  Problem Relation  Age of Onset  . Arthritis Mother   . High blood pressure Mother   . Heart disease Father   . Breast cancer Maternal Aunt   . Stroke Maternal Grandmother   . Cancer Paternal Grandmother   . Diabetes Brother   . Colon cancer Neg Hx     Current Outpatient Medications on File Prior to Visit  Medication Sig Dispense Refill  . celecoxib (CELEBREX) 200 MG capsule Take 200 mg by mouth daily.    Marland Kitchen LORazepam (ATIVAN) 2 MG tablet Take 1 tablet (2mg  total) by mouth 1 hour prior to MRI scheduled for 9/1. May cause drowsiness. 1 tablet 0  . Multiple Vitamin (MULTIVITAMIN WITH MINERALS) TABS tablet Take 1 tablet by mouth daily.    Marland Kitchen omeprazole (PRILOSEC) 20 MG capsule Take 20 mg by mouth daily.    . simvastatin (ZOCOR) 40 MG tablet Take 40 mg by mouth at bedtime.     . valsartan (DIOVAN) 320 MG tablet Take 320 mg by mouth daily.    . polyethylene glycol-electrolytes (NULYTELY) 420 g solution As directed (Patient not taking: Reported on 08/14/2020) 4000 mL 0   No current facility-administered medications on file prior to visit.    No Known Allergies  Social History   Substance and Sexual Activity  Alcohol Use Never    Social History   Tobacco Use  Smoking Status Never Smoker  Smokeless Tobacco Never Used    Review of Systems  Constitutional: Negative.   HENT: Negative.   Eyes: Negative.   Respiratory: Negative.   Cardiovascular: Negative.   Gastrointestinal: Negative.   Genitourinary: Negative.   Musculoskeletal: Negative.  Skin: Negative.   Neurological: Negative.   Endo/Heme/Allergies: Negative.   Psychiatric/Behavioral: Negative.     Objective   Vitals:   09/04/20 1103  BP: 133/83  Pulse: 67  Resp: 16  Temp: 98.4 F (36.9 C)  SpO2: 98%    Physical Exam Vitals reviewed.  Constitutional:      Appearance: Normal appearance. She is not ill-appearing.  HENT:     Head: Normocephalic and atraumatic.  Cardiovascular:     Rate and Rhythm: Normal rate and regular  rhythm.     Heart sounds: Normal heart sounds. No murmur heard.  No friction rub. No gallop.   Pulmonary:     Effort: Pulmonary effort is normal. No respiratory distress.     Breath sounds: Normal breath sounds. No stridor. No wheezing, rhonchi or rales.  Skin:    General: Skin is warm and dry.  Neurological:     Mental Status: She is alert and oriented to person, place, and time.   Dr. Tomie China notes reviewed  Assessment  Rectal cancer, need for central venous access Plan   Patient is scheduled for Port-A-Cath insertion on 09/09/2020.  The risks and benefits of the procedure including bleeding, infection, and pneumothorax were fully explained to the patient, who gave informed consent.

## 2020-09-04 NOTE — Progress Notes (Signed)
Brenda Mclean; [id number]; September 19, 1946   HPI Patient is a 74 year old white female who was referred to my care by Dr. Delton Coombes for Port-A-Cath insertion.  She was recently diagnosed with rectal cancer and is about to undergo chemotherapy. Past Medical History:  Diagnosis Date  . Arthritis   . COVID-19 03/2019  . GERD (gastroesophageal reflux disease)   . HLD (hyperlipidemia)   . HTN (hypertension)   . 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 Sigmoid diverticulitis 0865   Uncomplicated    Past Surgical History:  Procedure Laterality Date  . BIOPSY  07/10/2020   Procedure: BIOPSY;  Surgeon: [name], MD;  [facility]: [facility];  Service: Endoscopy;;  . CHOLECYSTECTOMY  1998  . COLON SURGERY  10/30/2012   Dr. Audrie Lia; transanal resection of 2 cm rectal polyp; pathology revealed superficially invasive moderately differentiated adenocarcinoma arising in adenomatous polyp of distal rectum.  . COLONOSCOPY  10/24/2013   Dr. West Carbo; with propofol; small granuloma and sutures distal rectum, small polyp mid rectum removed, sigmoid diverticulosis, otherwise normal exam.  No pathology received.  Recommended colonoscopy in 5 years.  . COLONOSCOPY  09/08/2012   Dr. West Carbo; with propofol; multi lobulated friable 3 cm mass distal rectum s/p multiple biopsies, scattered sigmoid diverticulosis, otherwise normal exam.  Pathology with adenomatous polyp.  . COLONOSCOPY WITH PROPOFOL N/A 07/10/2020   Procedure: COLONOSCOPY WITH PROPOFOL;  Surgeon: [name], MD;  [facility];  Service: Endoscopy;  Laterality: N/A;  10:00am  . POLYPECTOMY  07/10/2020   Procedure: POLYPECTOMY;  Surgeon: [name], MD;  [facility];  Service: Endoscopy;;  . REPLACEMENT TOTAL HIP W/  RESURFACING IMPLANTS Bilateral   . 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 REPLACEMENT TOTAL KNEE Right 12/03/2014  . TOTAL SHOULDER ARTHROPLASTY  04/26/2018    Family History  Problem Relation  Age of Onset  . Arthritis Mother   . High blood pressure Mother   . Heart disease Father   . Breast cancer Maternal Aunt   . Stroke Maternal Grandmother   . Cancer Paternal Grandmother   . Diabetes Brother   . Colon cancer Neg Hx     Current Outpatient Medications on File Prior to Visit  Medication Sig Dispense Refill  . celecoxib (CELEBREX) 200 MG capsule Take 200 mg by mouth daily.    Marland Kitchen LORazepam (ATIVAN) 2 MG tablet Take 1 tablet (2mg  total) by mouth 1 hour prior to MRI scheduled for 9/1. May cause drowsiness. 1 tablet 0  . Multiple Vitamin (MULTIVITAMIN WITH MINERALS) TABS tablet Take 1 tablet by mouth daily.    Marland Kitchen omeprazole (PRILOSEC) 20 MG capsule Take 20 mg by mouth daily.    . simvastatin (ZOCOR) 40 MG tablet Take 40 mg by mouth at bedtime.     . valsartan (DIOVAN) 320 MG tablet Take 320 mg by mouth daily.    . polyethylene glycol-electrolytes (NULYTELY) 420 g solution As directed (Patient not taking: Reported on 08/14/2020) 4000 mL 0   No current facility-administered medications on file prior to visit.    No Known Allergies  Social History   Substance and Sexual Activity  Alcohol Use Never    Social History   Tobacco Use  Smoking Status Never Smoker  Smokeless Tobacco Never Used    Review of Systems  Constitutional: Negative.   HENT: Negative.   Eyes: Negative.   Respiratory: Negative.   Cardiovascular: Negative.   Gastrointestinal: Negative.   Genitourinary: Negative.   Musculoskeletal: Negative.  Skin: Negative.   Neurological: Negative.   Endo/Heme/Allergies: Negative.   Psychiatric/Behavioral: Negative.     Objective   Vitals:   09/04/20 1103  BP: 133/83  Pulse: 67  Resp: 16  Temp: 98.4 F (36.9 C)  SpO2: 98%    Physical Exam Vitals reviewed.  Constitutional:      Appearance: Normal appearance. She is not ill-appearing.  HENT:     Head: Normocephalic and atraumatic.  Cardiovascular:     Rate and Rhythm: Normal rate and regular  rhythm.     Heart sounds: Normal heart sounds. No murmur heard.  No friction rub. No gallop.   Pulmonary:     Effort: Pulmonary effort is normal. No respiratory distress.     Breath sounds: Normal breath sounds. No stridor. No wheezing, rhonchi or rales.  Skin:    General: Skin is warm and dry.  Neurological:     Mental Status: She is alert and oriented to person, place, and time.   Dr. Tomie China notes reviewed  Assessment  Rectal cancer, need for central venous access Plan   Patient is scheduled for Port-A-Cath insertion on 09/09/2020.  The risks and benefits of the procedure including bleeding, infection, and pneumothorax were fully explained to the patient, who gave informed consent.

## 2020-09-04 NOTE — Patient Instructions (Signed)

## 2020-09-05 ENCOUNTER — Ambulatory Visit (HOSPITAL_COMMUNITY): Payer: Medicare Other

## 2020-09-08 ENCOUNTER — Other Ambulatory Visit (HOSPITAL_COMMUNITY)
Admission: RE | Admit: 2020-09-08 | Discharge: 2020-09-08 | Disposition: A | Discharge status: Home / Self Care | Payer: Medicare Other | Source: Ambulatory Visit | Attending: General Surgery | Admitting: General Surgery

## 2020-09-08 ENCOUNTER — Encounter (HOSPITAL_COMMUNITY)
Admission: RE | Admit: 2020-09-08 | Discharge: 2020-09-08 | Disposition: A | Discharge status: Home / Self Care | Payer: Medicare Other | Source: Ambulatory Visit | Attending: General Surgery | Admitting: General Surgery

## 2020-09-08 ENCOUNTER — Other Ambulatory Visit: Payer: Self-pay

## 2020-09-08 DIAGNOSIS — Z20822 Contact with and (suspected) exposure to covid-19: Secondary | ICD-10-CM | POA: Insufficient documentation

## 2020-09-08 DIAGNOSIS — Z01812 Encounter for preprocedural laboratory examination: Secondary | ICD-10-CM | POA: Diagnosis present

## 2020-09-08 LAB — SARS CORONAVIRUS 2 (TAT 6-24 HRS): SARS Coronavirus 2: NEGATIVE

## 2020-09-09 ENCOUNTER — Encounter (HOSPITAL_COMMUNITY): Payer: Self-pay

## 2020-09-09 ENCOUNTER — Ambulatory Visit (HOSPITAL_COMMUNITY): Payer: Medicare Other

## 2020-09-09 ENCOUNTER — Encounter (HOSPITAL_COMMUNITY): Payer: Self-pay | Admitting: General Surgery

## 2020-09-09 ENCOUNTER — Ambulatory Visit (HOSPITAL_COMMUNITY)
Admission: RE | Admit: 2020-09-09 | Discharge: 2020-09-09 | Disposition: A | Discharge status: Home / Self Care | Payer: Medicare Other | Attending: General Surgery | Admitting: General Surgery

## 2020-09-09 ENCOUNTER — Ambulatory Visit (HOSPITAL_COMMUNITY): Payer: Medicare Other | Admitting: Anesthesiology

## 2020-09-09 ENCOUNTER — Encounter (HOSPITAL_COMMUNITY)
Admission: RE | Disposition: A | Discharge status: Home / Self Care | Payer: Self-pay | Source: Home / Self Care | Attending: General Surgery

## 2020-09-09 DIAGNOSIS — M1990 Unspecified osteoarthritis, unspecified site: Secondary | ICD-10-CM | POA: Diagnosis not present

## 2020-09-09 DIAGNOSIS — I10 Essential (primary) hypertension: Secondary | ICD-10-CM | POA: Diagnosis not present

## 2020-09-09 DIAGNOSIS — K219 Gastro-esophageal reflux disease without esophagitis: Secondary | ICD-10-CM | POA: Diagnosis not present

## 2020-09-09 DIAGNOSIS — C20 Malignant neoplasm of rectum: Secondary | ICD-10-CM | POA: Insufficient documentation

## 2020-09-09 DIAGNOSIS — Z85048 Personal history of other malignant neoplasm of rectum, rectosigmoid junction, and anus: Secondary | ICD-10-CM | POA: Diagnosis not present

## 2020-09-09 DIAGNOSIS — Z9049 Acquired absence of other specified parts of digestive tract: Secondary | ICD-10-CM | POA: Insufficient documentation

## 2020-09-09 DIAGNOSIS — Z79899 Other long term (current) drug therapy: Secondary | ICD-10-CM | POA: Diagnosis not present

## 2020-09-09 DIAGNOSIS — Z95828 Presence of other vascular implants and grafts: Secondary | ICD-10-CM

## 2020-09-09 DIAGNOSIS — Z86010 Personal history of colonic polyps: Secondary | ICD-10-CM | POA: Insufficient documentation

## 2020-09-09 DIAGNOSIS — Z791 Long term (current) use of non-steroidal anti-inflammatories (NSAID): Secondary | ICD-10-CM | POA: Diagnosis not present

## 2020-09-09 DIAGNOSIS — E785 Hyperlipidemia, unspecified: Secondary | ICD-10-CM | POA: Insufficient documentation

## 2020-09-09 HISTORY — DX: Presence of other vascular implants and grafts: Z95.828

## 2020-09-09 HISTORY — PX: PORTACATH PLACEMENT: SHX2246

## 2020-09-09 IMAGING — DX DG CHEST 1V PORT
1 series · 2 of 2 positions shown · non-contrast
Comparison: None.

CLINICAL DATA: Left Port-A-Cath insertion

EXAM:
PORTABLE CHEST 1 VIEW

[Series 1: chest ap · 0.14mm/px · 2 of 2 slices shown]
[im 1/2]
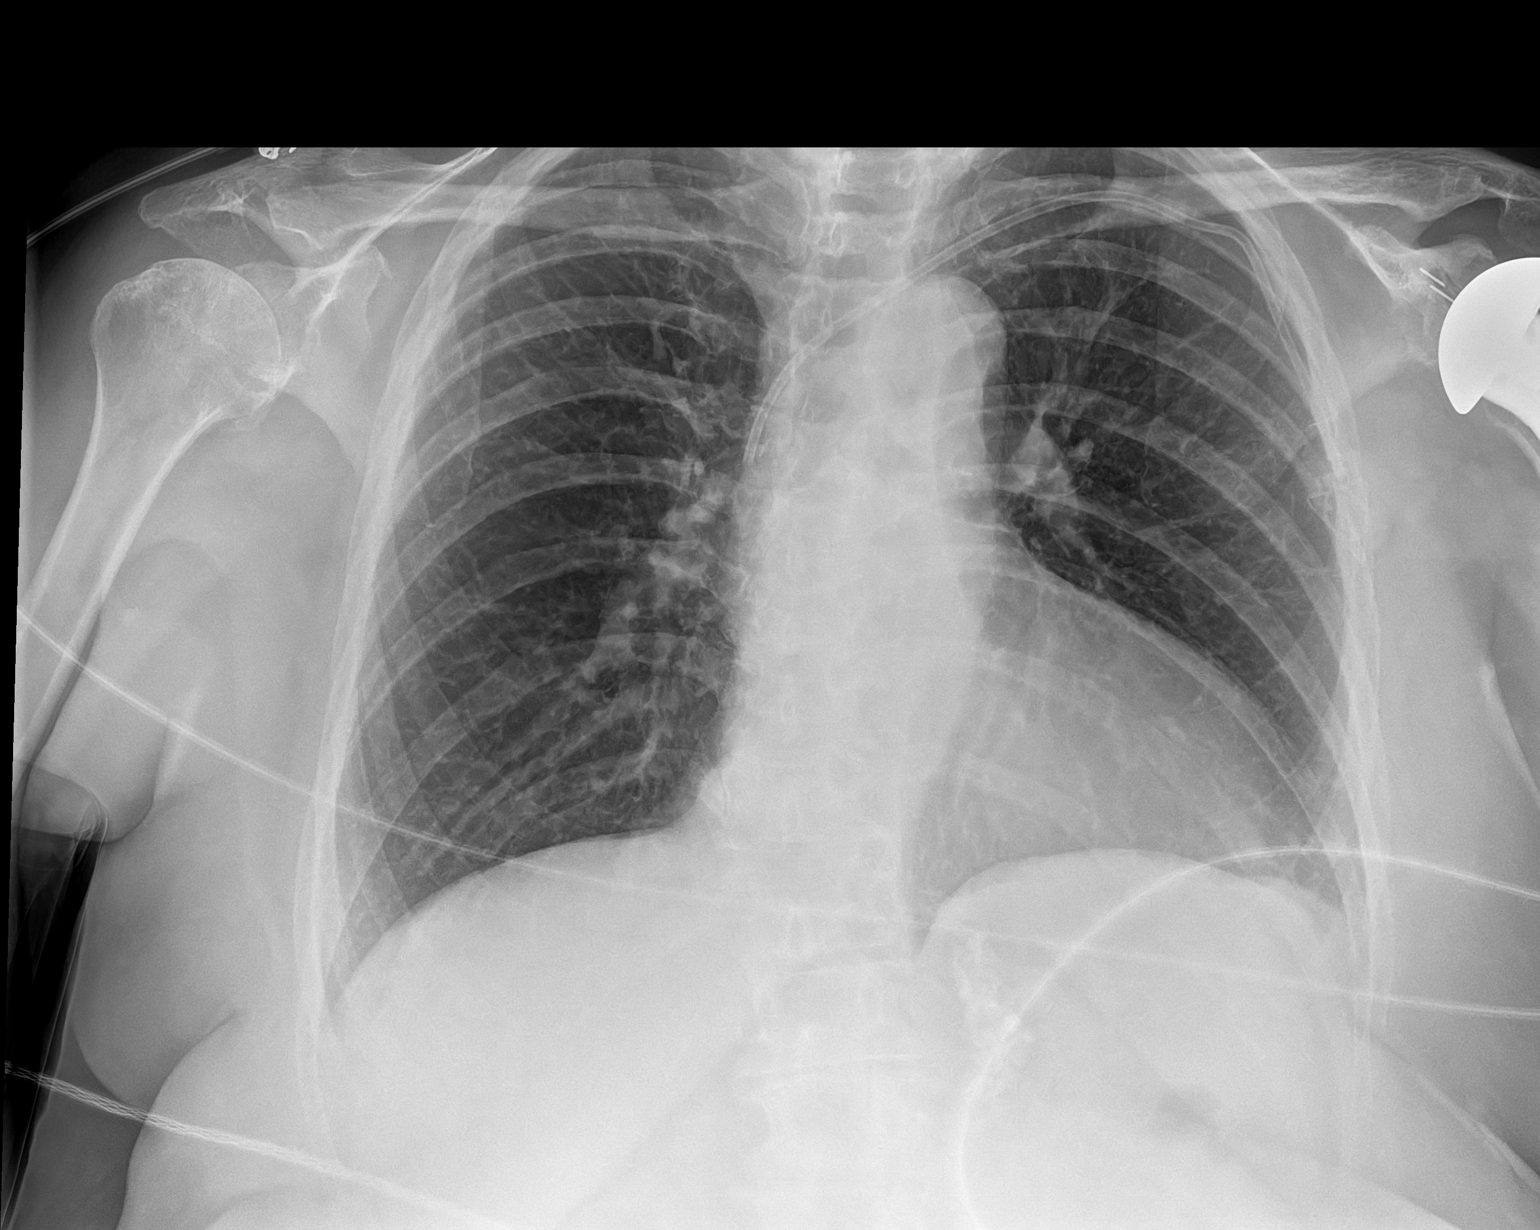
[im 2/2]
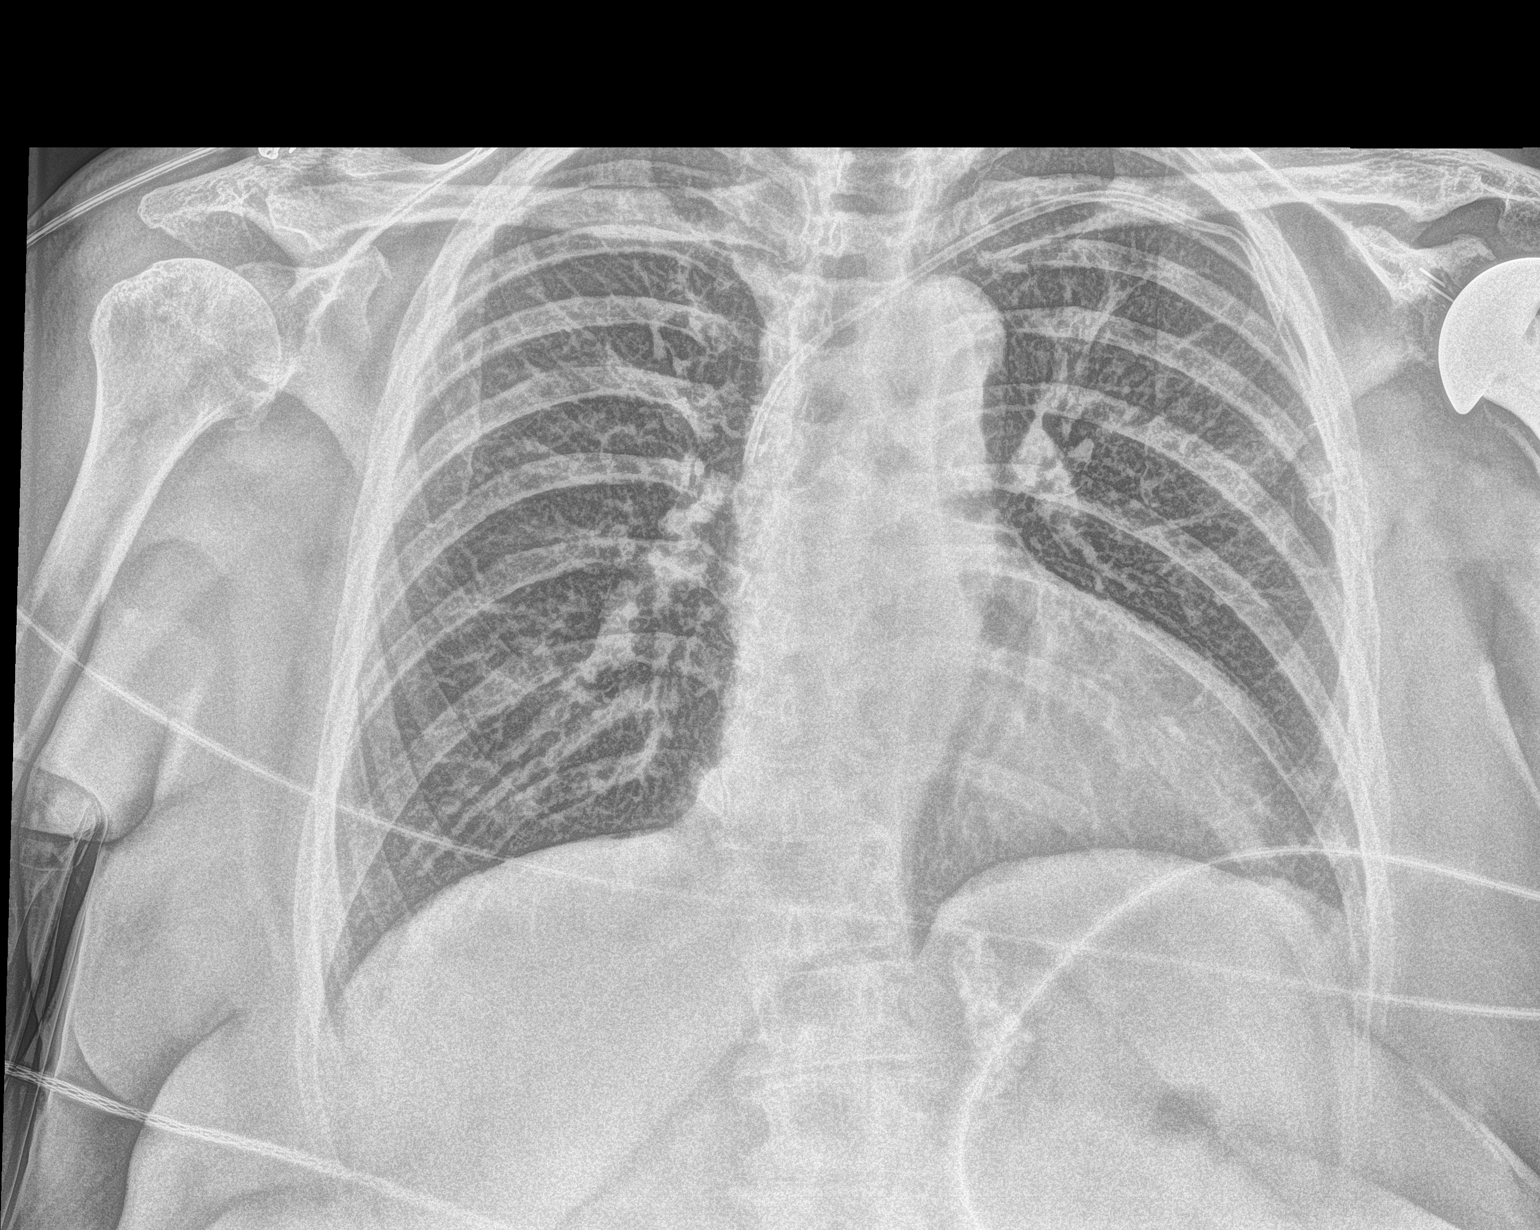

[2 of 2 positions shown; findings below may reference images not displayed]

FINDINGS: Left Port-A-Cath in place with the tip in the SVC. No pneumothorax.
Heart and mediastinal contours are within normal limits. No focal
opacities or effusions. No acute bony abnormality.
IMPRESSION: Left Port-A-Cath in place without pneumothorax.

No active disease.

## 2020-09-09 SURGERY — INSERTION, TUNNELED CENTRAL VENOUS DEVICE, WITH PORT
Anesthesia: General | Site: Chest | Laterality: Left

## 2020-09-09 MED ORDER — LACTATED RINGERS IV SOLN: INTRAVENOUS | Status: DC | PRN

## 2020-09-09 MED ORDER — MIDAZOLAM HCL 5 MG/5ML IJ SOLN
INTRAMUSCULAR | Status: DC | PRN
  Administered 2020-09-09: 2 mg via INTRAVENOUS

## 2020-09-09 MED ORDER — HEPARIN SOD (PORK) LOCK FLUSH 100 UNIT/ML IV SOLN
INTRAVENOUS | Status: DC | PRN
  Administered 2020-09-09: 5 [IU] via INTRAVENOUS

## 2020-09-09 MED ORDER — LACTATED RINGERS IV SOLN
Freq: Once | INTRAVENOUS | Status: AC
  Administered 2020-09-09: 1000 mL via INTRAVENOUS

## 2020-09-09 MED ORDER — SODIUM CHLORIDE (PF) 0.9 % IJ SOLN
INTRAMUSCULAR | Status: DC | PRN
  Administered 2020-09-09: 500 mL

## 2020-09-09 MED ORDER — CHLORHEXIDINE GLUCONATE 0.12 % MT SOLN
15.0000 mL | Freq: Once | OROMUCOSAL | Status: AC
  Administered 2020-09-09: 15 mL via OROMUCOSAL

## 2020-09-09 MED ORDER — CHLORHEXIDINE GLUCONATE CLOTH 2 % EX PADS: 6.0000 | MEDICATED_PAD | Freq: Once | CUTANEOUS | Status: DC

## 2020-09-09 MED ORDER — HYDROMORPHONE HCL 1 MG/ML IJ SOLN: 0.2500 mg | INTRAMUSCULAR | Status: DC | PRN

## 2020-09-09 MED ORDER — FENTANYL CITRATE (PF) 100 MCG/2ML IJ SOLN
INTRAMUSCULAR | Status: AC
  Filled 2020-09-09: qty 2

## 2020-09-09 MED ORDER — CEFAZOLIN SODIUM-DEXTROSE 2-4 GM/100ML-% IV SOLN
2.0000 g | INTRAVENOUS | Status: AC
  Administered 2020-09-09: 2 g via INTRAVENOUS
  Filled 2020-09-09: qty 100

## 2020-09-09 MED ORDER — PROPOFOL 500 MG/50ML IV EMUL
INTRAVENOUS | Status: DC | PRN
  Administered 2020-09-09: 100 ug/kg/min via INTRAVENOUS

## 2020-09-09 MED ORDER — ORAL CARE MOUTH RINSE: 15.0000 mL | Freq: Once | OROMUCOSAL | Status: AC

## 2020-09-09 MED ORDER — LIDOCAINE HCL (PF) 1 % IJ SOLN
INTRAMUSCULAR | Status: DC | PRN
  Administered 2020-09-09: 7 mL

## 2020-09-09 MED ORDER — PROCHLORPERAZINE MALEATE 10 MG PO TABS: 10.0000 mg | ORAL_TABLET | Freq: Four times a day (QID) | ORAL | 1 refills | Status: DC | PRN

## 2020-09-09 MED ORDER — TRAMADOL HCL 50 MG PO TABS: 50.0000 mg | ORAL_TABLET | Freq: Four times a day (QID) | ORAL | 0 refills | Status: DC | PRN

## 2020-09-09 MED ORDER — LIDOCAINE HCL (PF) 1 % IJ SOLN
INTRAMUSCULAR | Status: AC
  Filled 2020-09-09: qty 30

## 2020-09-09 MED ORDER — PROPOFOL 10 MG/ML IV BOLUS
INTRAVENOUS | Status: DC | PRN
  Administered 2020-09-09: 10 mg via INTRAVENOUS
  Administered 2020-09-09: 20 mg via INTRAVENOUS

## 2020-09-09 MED ORDER — LIDOCAINE-PRILOCAINE 2.5-2.5 % EX CREA: TOPICAL_CREAM | CUTANEOUS | 3 refills | Status: DC

## 2020-09-09 MED ORDER — HEPARIN SOD (PORK) LOCK FLUSH 100 UNIT/ML IV SOLN
INTRAVENOUS | Status: AC
  Filled 2020-09-09: qty 5

## 2020-09-09 MED ORDER — KETOROLAC TROMETHAMINE 30 MG/ML IJ SOLN: 15.0000 mg | Freq: Once | INTRAMUSCULAR | Status: DC

## 2020-09-09 MED ORDER — MIDAZOLAM HCL 2 MG/2ML IJ SOLN
INTRAMUSCULAR | Status: AC
  Filled 2020-09-09: qty 2

## 2020-09-09 MED ORDER — ONDANSETRON HCL 4 MG/2ML IJ SOLN
INTRAMUSCULAR | Status: DC | PRN
  Administered 2020-09-09: 4 mg via INTRAVENOUS

## 2020-09-09 MED ORDER — ONDANSETRON HCL 4 MG/2ML IJ SOLN
INTRAMUSCULAR | Status: AC
  Filled 2020-09-09: qty 2

## 2020-09-09 MED ORDER — ONDANSETRON HCL 4 MG/2ML IJ SOLN: 4.0000 mg | Freq: Once | INTRAMUSCULAR | Status: DC | PRN

## 2020-09-09 MED ORDER — FENTANYL CITRATE (PF) 100 MCG/2ML IJ SOLN
INTRAMUSCULAR | Status: DC | PRN
Start: 2020-09-09 — End: 2020-09-09
  Administered 2020-09-09 (×2): 50 ug via INTRAVENOUS

## 2020-09-09 SURGICAL SUPPLY — 30 items
ADH SKN CLS APL DERMABOND .7 (GAUZE/BANDAGES/DRESSINGS) ×1
APL PRP STRL LF ISPRP CHG 10.5 (MISCELLANEOUS) ×1
APPLICATOR CHLORAPREP 10.5 ORG (MISCELLANEOUS) ×2 IMPLANT
BAG DECANTER FOR FLEXI CONT (MISCELLANEOUS) ×2 IMPLANT
CLOTH BEACON ORANGE TIMEOUT ST (SAFETY) ×2 IMPLANT
COVER LIGHT HANDLE STERIS (MISCELLANEOUS) ×4 IMPLANT
COVER WAND RF STERILE (DRAPES) ×2 IMPLANT
DECANTER SPIKE VIAL GLASS SM (MISCELLANEOUS) ×2 IMPLANT
DERMABOND ADVANCED (GAUZE/BANDAGES/DRESSINGS) ×1
DERMABOND ADVANCED .7 DNX12 (GAUZE/BANDAGES/DRESSINGS) ×1 IMPLANT
DRAPE C-ARM FOLDED MOBILE STRL (DRAPES) ×2 IMPLANT
ELECT REM PT RETURN 9FT ADLT (ELECTROSURGICAL) ×2
ELECTRODE REM PT RTRN 9FT ADLT (ELECTROSURGICAL) ×1 IMPLANT
GLOVE BIOGEL PI IND STRL 7.0 (GLOVE) ×2 IMPLANT
GLOVE BIOGEL PI INDICATOR 7.0 (GLOVE) ×2
GLOVE SURG SS PI 7.5 STRL IVOR (GLOVE) ×2 IMPLANT
GOWN STRL REUS W/TWL LRG LVL3 (GOWN DISPOSABLE) ×4 IMPLANT
IV NS 500ML (IV SOLUTION) ×2
IV NS 500ML BAXH (IV SOLUTION) ×1 IMPLANT
KIT PORT POWER 8FR ISP MRI (Port) ×2 IMPLANT
KIT TURNOVER KIT A (KITS) ×2 IMPLANT
NEEDLE HYPO 25X1 1.5 SAFETY (NEEDLE) ×2 IMPLANT
PACK MINOR (CUSTOM PROCEDURE TRAY) ×2 IMPLANT
PAD ARMBOARD 7.5X6 YLW CONV (MISCELLANEOUS) ×2 IMPLANT
SET BASIN LINEN APH (SET/KITS/TRAYS/PACK) ×2 IMPLANT
SUT MNCRL AB 4-0 PS2 18 (SUTURE) ×2 IMPLANT
SUT VIC AB 3-0 SH 27 (SUTURE) ×2
SUT VIC AB 3-0 SH 27X BRD (SUTURE) ×1 IMPLANT
SYR 5ML LL (SYRINGE) ×2 IMPLANT
SYR CONTROL 10ML LL (SYRINGE) ×2 IMPLANT

## 2020-09-09 NOTE — Anesthesia Preprocedure Evaluation (Signed)
Anesthesia Evaluation  Patient identified by MRN, date of birth, ID band Patient awake    Reviewed: Allergy & Precautions, NPO status , Patient's Chart, lab work & pertinent test results, reviewed documented beta blocker date and time   History of Anesthesia Complications Negative for: history of anesthetic complications  Airway Mallampati: II  TM Distance: >3 FB Neck ROM: Full    Dental  (+) Dental Advisory Given   Pulmonary neg pulmonary ROS,    Pulmonary exam normal breath sounds clear to auscultation       Cardiovascular Exercise Tolerance: Good hypertension, Pt. on medications Normal cardiovascular exam Rhythm:Regular Rate:Normal     Neuro/Psych negative neurological ROS  negative psych ROS   GI/Hepatic Neg liver ROS, GERD  Medicated and Controlled,Rectal cancer   Endo/Other  negative endocrine ROS  Renal/GU negative Renal ROS  negative genitourinary   Musculoskeletal  (+) Arthritis , Osteoarthritis,    Abdominal   Peds negative pediatric ROS (+)  Hematology negative hematology ROS (+)   Anesthesia Other Findings   Reproductive/Obstetrics                            Anesthesia Physical Anesthesia Plan  ASA: II  Anesthesia Plan: General   Post-op Pain Management:    Induction: Intravenous  PONV Risk Score and Plan: TIVA  Airway Management Planned: Nasal Cannula, Natural Airway and Simple Face Mask  Additional Equipment:   Intra-op Plan:   Post-operative Plan:   Informed Consent: I have reviewed the patients History and Physical, chart, labs and discussed the procedure including the risks, benefits and alternatives for the proposed anesthesia with the patient or authorized representative who has indicated his/her understanding and acceptance.     Dental advisory given  Plan Discussed with: CRNA and Surgeon  Anesthesia Plan Comments:        Anesthesia Quick  Evaluation

## 2020-09-09 NOTE — Discharge Instructions (Signed)

## 2020-09-09 NOTE — Patient Instructions (Signed)
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  You will be treated in the clinic every 2 weeks with a combination of chemotherapy drugs.  Those drugs are:  Oxaliplatin; leucovorin; and fluorouracil (5FU).  The acronym for this chemotherapy regimen is FOLFOX.  Dr. Delton Coombes recommends 4 months total of this treatment regimen followed by a course chemoradiation therapy with Xeloda (a chemo pill).  The intent of treatment is cure.  You will see the doctor regularly throughout treatment.  We will obtain blood work from you prior to every treatment and monitor your results to make sure it is safe to give your treatment. The doctor monitors your response to treatment by the way you are feeling, your blood work, and by obtaining scans periodically.  There will be wait times while you are here for treatment.  It will take about 30 minutes to 1 hour for your lab work to result.  Then there will be wait times while pharmacy mixes your medications.    Oxaliplatin (Eloxatin)  About This Drug  Oxaliplatin is used to treat cancer. It is given in the vein (IV).  It takes two hours to infuse.  Possible Side Effects  . Bone marrow suppression. This is a decrease in the number of white blood cells, red blood cells, and platelets. This may raise your risk of infection, make you tired and weak (fatigue), and raise your risk of bleeding.  . Tiredness  . Soreness of the mouth and throat. You may have red areas, white patches, or sores that hurt.  . Nausea and vomiting (throwing up)  . Diarrhea (loose bowel movements)  . Changes in your liver function  . Effects on the nerves called peripheral neuropathy. You may feel numbness, tingling, or pain in your hands and feet, and may be worse in cold temperatures. It may be hard for you to button your clothes, open jars, or walk as usual. The effect on the nerves may get worse with more doses of the  drug. These effects get better in some people after the drug is stopped but it does not get better in all people  Note: Each of the side effects above was reported in 40% or greater of patients treated with oxaliplatin. Not all possible side effects are included above.   Warnings and Precautions  . Allergic reactions, including anaphylaxis, which may be life-threatening are rare but may happen in some patients. Signs of allergic reaction to this drug may be swelling of the face, feeling like your tongue or throat are swelling, trouble breathing, rash, itching, fever, chills, feeling dizzy, and/or feeling that your heart is beating in a fast or not normal way. If this happens, do not take another dose of this drug. You should get urgent medical treatment.  . Inflammation (swelling) of the lungs, which may be life-threatening. You may have a dry cough or trouble breathing.  . Effects on the nerves (neuropathy) may resolve within 14 days, or it may persist beyond 14 days.  . Severe decrease in white blood cells when combined with the chemotherapy agents 5-fluorouracil and leucovorin. This may be life-threatening.  . Severe changes in your liver function  . Abnormal heart beat and/or EKG, which can be life-threatening  . Rhabdomyolysis- damage to your muscles which may release proteins in your blood and affect how your kidneys work, which can be life-threatening. You may have severe muscle weakness and/or pain, or dark urine.  Important  Information  . This drug may impair your ability to drive or use machinery. Talk to your doctor and/or nurse about precautions you may need to take.  . This drug may be present in the saliva, tears, sweat, urine, stool, vomit, semen, and vaginal secretions. Talk to your doctor and/or your nurse about the necessary precautions to take during this time.  * The effects on the nerves can be aggravated by exposure to cold. Avoid cold beverages, use of ice and make  sure you cover your skin and dress warmly prior to being exposed to cold       temperatures while you are receiving treatment with oxaliplatin*   Treating Side Effects  . Manage tiredness by pacing your activities for the day.  . Be sure to include periods of rest between energy-draining activities.  . To decrease the risk of infection, wash your hands regularly.  . Avoid close contact with people who have a cold, the flu, or other infections. . Take your temperature as your doctor or nurse tells you, and whenever you feel like you may have a fever.  . To help decrease the risk of bleeding, use a soft toothbrush. Check with your nurse before using dental floss.  . Be very careful when using knives or tools.  . Use an electric shaver instead of a razor.  . Drink plenty of fluids (a minimum of eight glasses per day is recommended).  . Mouth care is very important. Your mouth care should consist of routine, gentle cleaning of your teeth or dentures and rinsing your mouth with a mixture of 1/2 teaspoon of salt in 8 ounces of water or 1/2 teaspoon of baking soda in 8 ounces of water. This should be done at least after each meal and at bedtime.  . If you have mouth sores, avoid mouthwash that has alcohol. Also avoid alcohol and smoking because they can bother your mouth and throat.  . To help with nausea and vomiting, eat small, frequent meals instead of three large meals a day. Choose foods and drinks that are at room temperature. Ask your nurse or doctor about other helpful tips and medicine that is available to help stop or lessen these symptoms.  . If you throw up or have loose bowel movements, you should drink more fluids so that you do not become dehydrated (lack of water in the body from losing too much fluid).  . If you have diarrhea, eat low-fiber foods that are high in protein and calories and avoid foods that can irritate your digestive tracts or lead to cramping.  . Ask your  nurse or doctor about medicine that can lessen or stop your diarrhea.  . If you have numbness and tingling in your hands and feet, be careful when cooking, walking, and handling sharp objects and hot liquids.  . Do not drink cold drinks or use ice in beverages. Drink fluids at room temperature or warmer, and drink through a straw.  . Wear gloves to touch cold objects, and wear warm clothing and cover you skin during cold weather.   Food and Drug Interactions  . There are no known interactions of oxaliplatin with food and other medications.  . This drug may interact with other medicines. Tell your doctor and pharmacist about all the prescription and over-the-counter medicines and dietary supplements (vitamins, minerals, herbs and others) that you are taking at this time. Also, check with your doctor or pharmacist before starting any new prescription or over-the-counter medicines,  or dietary supplements to make sure that there are no interactions   When to Call the Doctor  Call your doctor or nurse if you have any of these symptoms and/or any new or unusual symptoms:  . Fever of 100.4 F (38 C) or higher  . Chills  . Tiredness that interferes with your daily activities  . Feeling dizzy or lightheaded  . Easy bleeding or bruising  . Feeling that your heart is beating in a fast or not normal way (palpitations)  . Pain in your chest  . Dry cough  . Trouble breathing  . Pain in your mouth or throat that makes it hard to eat or drink  . Nausea that stops you from eating or drinking and/or is not relieved by prescribed medicines  . Throwing up  . Diarrhea, 4 times in one day or diarrhea with lack of strength or a feeling of being dizzy  . Numbness, tingling, or pain in your hands and feet  . Signs of possible liver problems: dark urine, pale bowel movements, bad stomach pain, feeling very tired and weak, unusual itching, or yellowing of the eyes or skin  . Signs of  rhabdomyolysis: decreased urine, very dark urine, muscle pain in the shoulders, thighs, or lower back; muscle weakness or trouble moving arms and legs  . Signs of allergic reaction: swelling of the face, feeling like your tongue or throat are swelling, trouble breathing, rash, itching, fever, chills, feeling dizzy, and/or feeling that your heart is beating in a fast or not normal way. If this happens, call 911 for emergency care.  . If you think you may be pregnant  Reproduction Warnings  . Pregnancy warning: This drug may have harmful effects on the unborn baby. Women of childbearing potential should use effective methods of birth control during your cancer treatment. Let your doctor know right away if you think you may be pregnant or may have impregnated your partner.  . Breastfeeding warning: It is not known if this drug passes into breast milk. For this reason, women should talk to their doctor about the risks and benefits of breastfeeding during treatment with this drug because this drug may enter the breast milk and cause harm to a breastfeeding baby.  . Fertility warning: Human fertility studies have not been done with this drug. Talk with your doctor or nurse if you plan to have children. Ask for information on sperm or egg banking.   Leucovorin Calcium  About This Drug  Leucovorin is a vitamin. It is used in combination with other cancer fighting drugs such as 5-fluorouracil and methotrexate. Leucovorin is given in the vein (IV).  This drug runs at the same time as the oxaliplatin and takes 2 hours to infuse.   Possible Side Effects . Rash and itching  Note: Leucovorin by itself has very few side effects. Other side effects you may have can be caused by the other drugs you are taking, such as 5-fluorouracil.   Warnings and Precautions  . Allergic reactions, including anaphylaxis are rare but may happen in some patients. Signs of allergic reaction to this drug may be swelling of  the face, feeling like your tongue or throat are swelling, trouble breathing, rash, itching, fever, chills, feeling dizzy, and/or feeling that your heart is beating in a fast or not normal way. If this happens, do not take another dose of this drug. You should get urgent medical treatment.  Food and Drug Interactions  . There are no known  interactions of leucovorin with food.  . This drug may interact with other medicines. Tell your doctor and pharmacist about all the prescription and over-the-counter medicines and dietary supplements (vitamins, minerals, herbs and others) that you are taking at this time.  . Also, check with your doctor or pharmacist before starting any new prescription or over-the-counter medicines, or dietary supplements to make sure that there are no interactions.   When to Call the Doctor  Call your doctor or nurse if you have any of these symptoms and/or any new or unusual symptoms:  . A new rash or a rash that is not relieved by prescribed medicines  . Signs of allergic reaction: swelling of the face, feeling like your tongue or throat are swelling, trouble breathing, rash, itching, fever, chills, feeling dizzy, and/or feeling that your heart is beating in a fast or not normal way. If this happens, call 911 for emergency care.  . If you think you may be pregnant   Reproduction Warnings  . Pregnancy warning: It is not known if this drug may harm an unborn child. For this reason, be sure to talk with your doctor if you are pregnant or planning to become pregnant while receiving this drug. Let your doctor know right away if you think you may be pregnant  . Breastfeeding warning: It is not known if this drug passes into breast milk. For this reason, women should talk to their doctor about the risks and benefits of breastfeeding during treatment with this drug because this drug may enter the breast milk and cause harm to a breastfeeding baby.  . Fertility warning: Human  fertility studies have not been done with this drug. Talk with your doctor or nurse if you plan to have children. Ask for information on sperm or egg banking.   5-Fluorouracil (Adrucil; 5FU)  About This Drug  Fluorouracil is used to treat cancer. It is given in the vein (IV). It is given as an IV push from a syringe and also as a continuous infusion given via an ambulatory pump (a pump you take home and wear for a specified amount of time).  Possible Side Effects  . Bone marrow suppression. This is a decrease in the number of white blood cells, red blood cells, and platelets. This may raise your risk of infection, make you tired and weak (fatigue), and raise your risk of bleeding  . Changes in the tissue of the heart and/or heart attack. Some changes may happen that can cause your heart to have less ability to pump blood.  . Blurred vision or other changes in eyesight  . Nausea and throwing up (vomiting)  . Diarrhea (loose bowel movements)  . Ulcers - sores that may cause pain or bleeding in your digestive tract, which includes your mouth, esophagus, stomach, small/large intestines and rectum  . Soreness of the mouth and throat. You may have red areas, white patches, or sores that hurt.  . Allergic reactions, including anaphylaxis are rare but may happen in some patients. Signs of allergic reaction to this drug may be swelling of the face, feeling like your tongue or throat are swelling, trouble breathing, rash, itching, fever, chills, feeling dizzy, and/or feeling that your heart is beating in a fast or not normal way. If this happens, do not take another dose of this drug. You should get urgent medical treatment.  . Sensitivity to light (photosensitivity). Photosensitivity means that you may become more sensitive to the sun and/or light. You may get  a skin rash/reaction if you are in the sun or are exposed to sun lamps and tanning beds. Your eyes may water more, mostly in bright  light.  . Changes in your nail color, nail loss and/or brittle nail  . Darkening of the skin, or changes to the color of your skin and/or veins used for infusion  . Rash, dry skin, or itching  Note: Not all possible side effects are included above.  Warnings and Precautions  . Hand-and-foot syndrome. The palms of your hands or soles of your feet may tingle, become numb, painful, swollen, or red.  . Changes in your central nervous system can happen. The central nervous system is made up of your brain and spinal cord. You could feel extreme tiredness, agitation, confusion, hallucinations (see or hear things that are not there), trouble understanding or speaking, loss of control of your bowels or bladder, eyesight changes, numbness or lack of strength to your arms, legs, face, or body, or coma. If you start to have any of these symptoms let your doctor know right away.  . Side effects of this drug may be unexpectedly severe in some patients  Note: Some of the side effects above are very rare. If you have concerns and/or questions, please discuss them with your medical team.   Important Information  . This drug may be present in the saliva, tears, sweat, urine, stool, vomit, semen, and vaginal secretions. Talk to your doctor and/or your nurse about the necessary precautions to take during this time.   Treating Side Effects  . Manage tiredness by pacing your activities for the day.  . Be sure to include periods of rest between energy-draining activities.  . To help decrease the risk of infections, wash your hands regularly.  . Avoid close contact with people who have a cold, the flu, or other infections.  . Take your temperature as your doctor or nurse tells you, and whenever you feel like you may have a fever.  . Use a soft toothbrush. Check with your nurse before using dental floss.  . Be very careful when using knives or tools.  . Use an electric shaver instead of a  razor.  . If you have a nose bleed, sit with your head tipped slightly forward. Apply pressure by lightly pinching the bridge of your nose between your thumb and forefinger. Call your doctor if you feel dizzy or faint or if the bleeding doesn't stop after 10 to 15 minutes.  . Drink plenty of fluids (a minimum of eight glasses per day is recommended).  . If you throw up or have loose bowel movements, you should drink more fluids so that you do not  become dehydrated (lack of water in the body from losing too much fluid).  . To help with nausea and vomiting, eat small, frequent meals instead of three large meals a day. Choose foods and drinks that are at room temperature. Ask your nurse or doctor about other helpful tips and medicine that is available to help, stop, or lessen these symptoms.  . If you have diarrhea, eat low-fiber foods that are high in protein and calories and avoid foods that can irritate your digestive tracts or lead to cramping.  . Ask your nurse or doctor about medicine that can lessen or stop your diarrhea.  . Mouth care is very important. Your mouth care should consist of routine, gentle cleaning of your teeth or dentures and rinsing your mouth with a mixture of 1/2 teaspoon  of salt in 8 ounces of water or 1/2 teaspoon of baking soda in 8 ounces of water. This should be done at least after each meal and at bedtime.  . If you have mouth sores, avoid mouthwash that has alcohol. Also avoid alcohol and smoking because they can bother your mouth and throat.  Marland Kitchen Keeping your nails moisturized may help with brittleness.  . To help with itching, moisturize your skin several times day.  . Use sunscreen with SPF 30 or higher when you are outdoors even for a short time. Cover up when you are out in the sun. Wear wide-brimmed hats, long-sleeved shirts, and pants. Keep your neck, chest, and back covered. Wear dark sun glasses when in the sun or bright lights.  . If you get a rash do  not put anything on it unless your doctor or nurse says you may. Keep the area around the rash clean and dry. Ask your doctor for medicine if your rash bothers you.  Marland Kitchen Keeping your pain under control is important to your well-being. Please tell your doctor or nurse if you are experiencing pain.   Food and Drug Interactions  . There are no known interactions of fluorouracil with food.  . Check with your doctor or pharmacist about all other prescription medicines and over-the-counter medicines and dietary supplements (vitamins, minerals, herbs and others) you are taking before starting this medicine as there are known drug interactions with 5-fluoroucacil. Also, check with your doctor or pharmacist before starting any new prescription or over-the-counter medicines, or dietary supplements to make sure that there are no interactions.  When to Call the Doctor  Call your doctor or nurse if you have any of these symptoms and/or any new or unusual symptoms:  . Fever of 100.4 F (38 C) or higher  . Chills  . Easy bleeding or bruising  . Nose bleed that doesn't stop bleeding after 10-15 minutes  . Trouble breathing  . Feeling dizzy or lightheaded  . Feeling that your heart is beating in a fast or not normal way (palpitations)  . Chest pain or symptoms of a heart attack. Most heart attacks involve pain in the center of the chest that lasts more than a few minutes. The pain may go away and come back or it can be constant. It can feel like pressure, squeezing, fullness, or pain. Sometimes pain is felt in one or both arms, the back, neck, jaw, or stomach. If any of these symptoms last 2 minutes, call 911.  Marland Kitchen Confusion and/or agitation  . Hallucinations  . Trouble understanding or speaking  . Loss of control of bowels or bladder  . Blurry vision or changes in your eyesight  . Headache that does not go away  . Numbness or lack of strength to your arms, legs, face, or body  . Nausea that  stops you from eating or drinking and/or is not relieved by prescribed medicines  . Throwing up  . Diarrhea, 4 times in one day or diarrhea with lack of strength or a feeling of being dizzy  . Pain in your mouth or throat that makes it hard to eat or drink  . Pain along the digestive tract - especially if worse after eating  . Blood in your vomit (bright red or coffee-ground) and/or stools (bright red, or black/tarry)  . Coughing up blood  . Tiredness that interferes with your daily activities  . Painful, red, or swollen areas on your hands or feet or around  your nails  . A new rash or a rash that is not relieved by prescribed medicines  . Develop sensitivity to sunlight/light  . Numbness and/or tingling of your hands and/or feet  . Signs of allergic reaction: swelling of the face, feeling like your tongue or throat are swelling, trouble breathing, rash, itching, fever, chills, feeling dizzy, and/or feeling that your heart is beating in a fast or not normal way. If this happens, call 911 for emergency care.  . If you think you are pregnant or may have impregnated your partner  Reproduction Warnings  . Pregnancy warning: This drug may have harmful effects on the unborn baby. Women of child bearing potential should use effective methods of birth control during your cancer treatment and 3 months after treatment. Men with female partners of childbearing potential should use effective methods of birth control during your cancer treatment and for 3 months after your cancer treatment. Let your doctor know right away if you think you may be pregnant or may have impregnated your partner.  . Breastfeeding warning: It is not known if this drug passes into breast milk. For this reason, Women should not breastfeed during treatment because this drug could enter the breast milk and cause harm to a breastfeeding baby.  . Fertility warning: In men and women both, this drug may affect your ability to  have children in the future. Talk with your doctor or nurse if you plan to have children. Ask for information on sperm or egg banking.   SELF CARE ACTIVITIES WHILE RECEIVING CHEMOTHERAPY:  Hydration Increase your fluid intake 48 hours prior to treatment and drink at least 8 to 12 cups (64 ounces) of water/decaffeinated beverages per day after treatment. You can still have your cup of coffee or soda but these beverages do not count as part of your 8 to 12 cups that you need to drink daily. No alcohol intake.  Medications Continue taking your normal prescription medication as prescribed.  If you start any new herbal or new supplements please let us know first to make sure it is safe.  Mouth Care Have teeth cleaned professionally before starting treatment. Keep dentures and partial plates clean. Use soft toothbrush and do not use mouthwashes that contain alcohol. Biotene is a good mouthwash that is available at most pharmacies or may be ordered by calling [phone]. Use warm salt water gargles (1 teaspoon salt per 1 quart warm water) before and after meals and at bedtime. If you need dental work, please let the doctor know before you go for your appointment so that we can coordinate the best possible time for you in regards to your chemo regimen. You need to also let your dentist know that you are actively taking chemo. We may need to do labs prior to your dental appointment.  Skin Care Always use sunscreen that has not expired and with SPF (Sun Protection Factor) of 50 or higher. Wear hats to protect your head from the sun. Remember to use sunscreen on your hands, ears, face, & feet.  Use good moisturizing lotions such as udder cream, eucerin, or even Vaseline. Some chemotherapies can cause dry skin, color changes in your skin and nails.    . Avoid long, hot showers or baths. . Use gentle, fragrance-free soaps and laundry detergent. . Use moisturizers, preferably creams or ointments rather than  lotions because the thicker consistency is better at preventing skin dehydration. Apply the cream or ointment within 15 minutes of showering. Reapply moisturizer  at night, and moisturize your hands every time after you wash them.  Hair Loss (if your doctor says your hair will fall out)  . If your doctor says that your hair is likely to fall out, decide before you begin chemo whether you want to wear a wig. You may want to shop before treatment to match your hair color. . Hats, turbans, and scarves can also camouflage hair loss, although some people prefer to leave their heads uncovered. If you go bare-headed outdoors, be sure to use sunscreen on your scalp. . Cut your hair short. It eases the inconvenience of shedding lots of hair, but it also can reduce the emotional impact of watching your hair fall out. . Don't perm or color your hair during chemotherapy. Those chemical treatments are already damaging to hair and can enhance hair loss. Once your chemo treatments are done and your hair has grown back, it's OK to resume dyeing or perming hair.  With chemotherapy, hair loss is almost always temporary. But when it grows back, it may be a different color or texture. In older adults who still had hair color before chemotherapy, the new growth may be completely gray.  Often, new hair is very fine and soft.  Infection Prevention Please wash your hands for at least 30 seconds using warm soapy water. Handwashing is the #1 way to prevent the spread of germs. Stay away from sick people or people who are getting over a cold. If you develop respiratory systems such as green/yellow mucus production or productive cough or persistent cough let us know and we will see if you need an antibiotic. It is a good idea to keep a pair of gloves on when going into grocery stores/[employer] to decrease your risk of coming into contact with germs on the carts, etc. Carry alcohol hand gel with you at all times and use it frequently  if out in public. If your temperature reaches 100.5 or higher please call the clinic and let us know.  If it is after hours or on the weekend please go to the ER if your temperature is over 100.5.  Please have your own personal thermometer at home to use.    Sex and bodily fluids If you are going to have sex, a condom must be used to protect the person that isn't taking chemotherapy. Chemo can decrease your libido (sex drive). For a few days after chemotherapy, chemotherapy can be excreted through your bodily fluids.  When using the toilet please close the lid and flush the toilet twice.  Do this for a few day after you have had chemotherapy.   Effects of chemotherapy on your sex life Some changes are simple and won't last long. They won't affect your sex life permanently.  Sometimes you may feel: . too tired . not strong enough to be very active . sick or sore  . not in the mood . anxious or low Your anxiety might not seem related to sex. For example, you may be worried about the cancer and how your treatment is going. Or you may be worried about money, or about how you family are coping with your illness.  These things can cause stress, which can affect your interest in sex. It's important to talk to your partner about how you feel.  Remember - the changes to your sex life don't usually last long. There's usually no medical reason to stop having sex during chemo. The drugs won't have any long term physical  effects on your performance or enjoyment of sex. Cancer can't be passed on to your partner during sex  Contraception It's important to use reliable contraception during treatment. Avoid getting pregnant while you or your partner are having chemotherapy. This is because the drugs may harm the baby. Sometimes chemotherapy drugs can leave a man or woman infertile.  This means you would not be able to have children in the future. You might want to talk to someone about permanent infertility. It can  be very difficult to learn that you may no longer be able to have children. Some people find counselling helpful. There might be ways to preserve your fertility, although this is easier for men than for women. You may want to speak to a fertility expert. You can talk about sperm banking or harvesting your eggs. You can also ask about other fertility options, such as donor eggs. If you have or have had breast cancer, your doctor might advise you not to take the contraceptive pill. This is because the hormones in it might affect the cancer. It is not known for sure whether or not chemotherapy drugs can be passed on through semen or secretions from the vagina. Because of this some doctors advise people to use a barrier method if you have sex during treatment. This applies to vaginal, anal or oral sex. Generally, doctors advise a barrier method only for the time you are actually having the treatment and for about a week after your treatment. Advice like this can be worrying, but this does not mean that you have to avoid being intimate with your partner. You can still have close contact with your partner and continue to enjoy sex.  Animals If you have cats or birds we just ask that you not change the litter or change the cage.  Please have someone else do this for you while you are on chemotherapy.   Food Safety During and After Cancer Treatment Food safety is important for people both during and after cancer treatment. Cancer and cancer treatments, such as chemotherapy, radiation therapy, and stem cell/bone marrow transplantation, often weaken the immune system. This makes it harder for your body to protect itself from foodborne illness, also called food poisoning. Foodborne illness is caused by eating food that contains harmful bacteria, parasites, or viruses.  Foods to avoid Some foods have a higher risk of becoming tainted with bacteria. These include: Marland Kitchen Unwashed fresh fruit and vegetables, especially leafy  vegetables that can hide dirt and other contaminants . Raw sprouts, such as alfalfa sprouts . Raw or undercooked beef, especially ground beef, or other raw or undercooked meat and poultry . Fatty, fried, or spicy foods immediately before or after treatment.  These can sit heavy on your stomach and make you feel nauseous. . Raw or undercooked shellfish, such as oysters. . Sushi and sashimi, which often contain raw fish.  . Unpasteurized beverages, such as unpasteurized fruit juices, raw milk, raw yogurt, or cider . Undercooked eggs, such as soft boiled, over easy, and poached; raw, unpasteurized eggs; or foods made with raw egg, such as homemade raw cookie dough and homemade mayonnaise  Simple steps for food safety  Shop smart. . Do not buy food stored or displayed in an unclean area. . Do not buy bruised or damaged fruits or vegetables. . Do not buy cans that have cracks, dents, or bulges. . Pick up foods that can spoil at the end of your shopping trip and store them in a cooler  on the way home.  Prepare and clean up foods carefully. . Rinse all fresh fruits and vegetables under running water, and dry them with a clean towel or paper towel. . Clean the top of cans before opening them. . After preparing food, wash your hands for 20 seconds with hot water and soap. Pay special attention to areas between fingers and under nails. . Clean your utensils and dishes with hot water and soap. Marland Kitchen Disinfect your kitchen and cutting boards using 1 teaspoon of liquid, unscented bleach mixed into 1 quart of water.    Dispose of old food. . Eat canned and packaged food before its expiration date (the "use by" or "best before" date). . Consume refrigerated leftovers within 3 to 4 days. After that time, throw out the food. Even if the food does not smell or look spoiled, it still may be unsafe. Some bacteria, such as Listeria, can grow even on foods stored in the refrigerator if they are kept for too  long.  Take precautions when eating out. . At restaurants, avoid buffets and salad bars where food sits out for a long time and comes in contact with many people. Food can become contaminated when someone with a virus, often a norovirus, or another "bug" handles it. . Put any leftover food in a "to-go" container yourself, rather than having the server do it. And, refrigerate leftovers as soon as you get home. . Choose restaurants that are clean and that are willing to prepare your food as you order it cooked.   AT HOME MEDICATIONS:                                                                                                                                                                Compazine/Prochlorperazine 10mg  tablet. Take 1 tablet every 6 hours as needed for nausea/vomiting. (This can make you sleepy)   EMLA cream. Apply a quarter size amount to port site 1 hour prior to chemo. Do not rub in. Cover with plastic wrap.    Diarrhea Sheet   If you are having loose stools/diarrhea, please purchase Imodium and begin taking as outlined:  At the first sign of poorly formed or loose stools you should begin taking Imodium (loperamide) 2 mg capsules.  Take two tablets (4mg ) followed by one tablet (2mg ) every 2 hours - DO NOT EXCEED 8 tablets in 24 hours.  If it is bedtime and you are having loose stools, take 2 tablets at bedtime, then 2 tablets every 4 hours until morning.   Always call the [facility] if you are having loose stools/diarrhea that you can't get under control.  Loose stools/diarrhea leads to dehydration (loss of water) in your body.  We have other options of trying to get the loose  stools/diarrhea to stop but you must let us know!   Constipation Sheet  Colace - 100 mg capsules - take 2 capsules daily.  If this doesn't help then you can increase to 2 capsules twice daily.  Please call if the above does not work for you. Do not go more than 2 days without a bowel movement.   It is very important that you do not become constipated.  It will make you feel sick to your stomach (nausea) and can cause abdominal pain and vomiting.  Nausea Sheet   Compazine/Prochlorperazine 10mg  tablet. Take 1 tablet every 6 hours as needed for nausea/vomiting (This can make you drowsy).  If you are having persistent nausea (nausea that does not stop) please call the [facility] and let us know the amount of nausea that you are experiencing.  If you begin to vomit, you need to call the [facility] and if it is the weekend and you have vomited more than one time and can't get it to stop-go to the Emergency Room.  Persistent nausea/vomiting can lead to dehydration (loss of fluid in your body) and will make you feel very weak and unwell. Ice chips, sips of clear liquids, foods that are at room temperature, crackers, and toast tend to be better tolerated.   SYMPTOMS TO REPORT AS SOON AS POSSIBLE AFTER TREATMENT:  FEVER GREATER THAN 100.4 F  CHILLS WITH OR WITHOUT FEVER  NAUSEA AND VOMITING THAT IS NOT CONTROLLED WITH YOUR NAUSEA MEDICATION  UNUSUAL SHORTNESS OF BREATH  UNUSUAL BRUISING OR BLEEDING  TENDERNESS IN MOUTH AND THROAT WITH OR WITHOUT   PRESENCE OF ULCERS  URINARY PROBLEMS  BOWEL PROBLEMS  UNUSUAL RASH      Wear comfortable clothing and clothing appropriate for easy access to any Portacath or PICC line. Let us know if there is anything that we can do to make your therapy better!    What to do if you need assistance after hours or on the weekends: CALL [phone].  HOLD on the line, do not hang up.  You will hear multiple messages but at the end you will be connected with a nurse triage line.  They will contact the doctor if necessary.  Most of the time they will be able to assist you.  Do not call the hospital operator.      I have been informed and understand all of the instructions given to me and have received a copy. I have been instructed to call the  clinic [phone] or my family physician as soon as possible for continued medical care, if indicated. I do not have any more questions at this time but understand that I may call the [facility] or the Patient Navigator at [phone] during office hours should I have questions or need assistance in obtaining follow-up care.

## 2020-09-09 NOTE — Transfer of Care (Signed)
Immediate Anesthesia Transfer of Care Note  Patient: Brenda Mclean  Procedure(s) Performed: INSERTION PORT-A-CATH  Patient Location: PACU  Anesthesia Type:MAC  Level of Consciousness: awake, alert  and oriented  Airway & Oxygen Therapy: Patient Spontanous Breathing and Patient connected to face mask oxygen  Post-op Assessment: Report given to RN and Post -op Vital signs reviewed and stable  Post vital signs: Reviewed and stable  Last Vitals:  Vitals Value Taken Time  BP 118/55 09/09/20 1040  Temp    Pulse 65 09/09/20 1041  Resp 9 09/09/20 1041  SpO2 98 % 09/09/20 1041  Vitals shown include unvalidated device data.  Last Pain:  Vitals:   09/09/20 0905  TempSrc: Oral  PainSc: 0-No pain         Complications: No complications documented.

## 2020-09-09 NOTE — Anesthesia Postprocedure Evaluation (Signed)
Anesthesia Post Note  Patient: Brenda Mclean  Procedure(s) Performed: INSERTION PORT-A-CATH (Left Chest)  Patient location during evaluation: PACU Anesthesia Type: General Level of consciousness: awake and alert Pain management: pain level controlled Vital Signs Assessment: post-procedure vital signs reviewed and stable Respiratory status: spontaneous breathing Cardiovascular status: stable Postop Assessment: no apparent nausea or vomiting Anesthetic complications: no   No complications documented.   Last Vitals:  Vitals:   09/09/20 1100 09/09/20 1118  BP: (!) 143/78 (!) 144/74  Pulse: 63 (!) 57  Resp: 13 15  Temp:  36.7 C  SpO2: 98% 99%    Last Pain:  Vitals:   09/09/20 1118  TempSrc: Oral  PainSc: 0-No pain                 Everette Rank

## 2020-09-09 NOTE — Interval H&P Note (Signed)
History and Physical Interval Note:  09/09/2020 9:47 AM  Brenda Mclean  has presented today for surgery, with the diagnosis of Rectal cancer.  The various methods of treatment have been discussed with the patient and family. After consideration of risks, benefits and other options for treatment, the patient has consented to  Procedure(s): INSERTION PORT-A-CATH (Left) as a surgical intervention.  The patient's history has been reviewed, patient examined, no change in status, stable for surgery.  I have reviewed the patient's chart and labs.  Questions were answered to the patient's satisfaction.     Aviva Signs

## 2020-09-09 NOTE — Op Note (Signed)
Patient:  Brenda Mclean  DOB:  [birth year]  MRN:  [id number]   Preop Diagnosis: Rectal carcinoma, need for central venous access  Postop Diagnosis: Same  Procedure: Port-A-Cath insertion  Surgeon: Aviva Signs, MD  Anes: MAC  Indications: Patient is a 74 year old white female who presents for Port-A-Cath.  She needs central venous access for chemotherapy for rectal carcinoma.  The risks and benefits of the procedure including bleeding, infection, and pneumothorax were fully explained to the patient, who gave informed consent.  Procedure note: The patient was placed in supine position.  After the left upper chest was was prepped and draped using the usual sterile technique with ChloraPrep, the patient was placed in Trendelenburg position.  Surgical site confirmation was performed.  1% Xylocaine was used for local anesthesia.  An incision was made below the left clavicle.  A subcutaneous pocket was formed.  A needle was advanced into the left subclavian vein using the Seldinger technique without difficulty.  A guidewire was then advanced into the right atrium under fluoroscopic guidance.  An introducer and peel-away sheath were placed over the guidewire.  The catheter was inserted through the peel-away sheath and the peel-away sheath was removed.  The catheter was then attached to the port and the port placed in subcutaneous pocket.  Adequate positioning was confirmed by fluoroscopy.  Good backflow of venous blood was noted on aspiration of the port.  The port was flushed with heparin flush.  The subcutaneous layer was reapproximated using a 3-0 Vicryl interrupted suture.  The skin was closed using a 4-0 Monocryl subcuticular suture.  Dermabond was applied.  All tape and needle counts were correct at the end of the procedure.  The patient was awakened and transferred to PACU in stable condition.  A chest x-ray will be performed at that time.  Complications: None  EBL: Minimal  Specimen:  None

## 2020-09-09 NOTE — Progress Notes (Signed)
.   Pharmacist Chemotherapy Monitoring - Initial Assessment    Anticipated start date: 09/16/20  Regimen:  . Are orders appropriate based on the patient's diagnosis, regimen, and cycle? Yes . Does the plan date match the patient's scheduled date? Yes . Is the sequencing of drugs appropriate? Yes . Are the premedications appropriate for the patient's regimen? Yes . Prior Authorization for treatment is: Approved o If applicable, is the correct biosimilar selected based on the patient's insurance? not applicable  Organ Function and Labs: Marland Kitchen Are dose adjustments needed based on the patient's renal function, hepatic function, or hematologic function? No . Are appropriate labs ordered prior to the start of patient's treatment? Yes . Other organ system assessment, if indicated: N/A . The following baseline labs, if indicated, have been ordered: N/A  Dose Assessment: . Are the drug doses appropriate? Yes . Are the following correct: o Drug concentrations Yes o IV fluid compatible with drug Yes o Administration routes Yes o Timing of therapy Yes . If applicable, does the patient have documented access for treatment and/or plans for port-a-cath placement? yes . If applicable, have lifetime cumulative doses been properly documented and assessed? not applicable Lifetime Dose Tracking  No doses have been documented on this patient for the following tracked chemicals: Doxorubicin, Epirubicin, Idarubicin, Daunorubicin, Mitoxantrone, Bleomycin, Oxaliplatin, Carboplatin, Liposomal Doxorubicin  o   Toxicity Monitoring/Prevention: . The patient has the following take home antiemetics prescribed: Prochlorperazine . The patient has the following take home medications prescribed: N/A . Medication allergies and previous infusion related reactions, if applicable, have been reviewed and addressed. Yes . The patient's current medication list has been assessed for drug-drug interactions with their chemotherapy  regimen. no significant drug-drug interactions were identified on review.  Order Review: . Are the treatment plan orders signed? No . Is the patient scheduled to see a provider prior to their treatment? Yes  I verify that I have reviewed each item in the above checklist and answered each question accordingly.  Wynona Neat 09/09/2020 12:52 PM

## 2020-09-10 ENCOUNTER — Encounter (HOSPITAL_COMMUNITY): Payer: Self-pay | Admitting: General Surgery

## 2020-09-11 ENCOUNTER — Other Ambulatory Visit: Payer: Self-pay

## 2020-09-11 ENCOUNTER — Ambulatory Visit (HOSPITAL_COMMUNITY)
Admission: RE | Admit: 2020-09-11 | Discharge: 2020-09-11 | Disposition: A | Discharge status: Home / Self Care | Payer: Medicare Other | Source: Ambulatory Visit | Attending: Gastroenterology | Admitting: Gastroenterology

## 2020-09-11 ENCOUNTER — Inpatient Hospital Stay (HOSPITAL_COMMUNITY): Payer: Medicare Other

## 2020-09-11 DIAGNOSIS — N281 Cyst of kidney, acquired: Secondary | ICD-10-CM | POA: Diagnosis present

## 2020-09-11 DIAGNOSIS — Z95828 Presence of other vascular implants and grafts: Secondary | ICD-10-CM

## 2020-09-11 DIAGNOSIS — C20 Malignant neoplasm of rectum: Secondary | ICD-10-CM

## 2020-09-11 IMAGING — MR MR ABDOMEN WO/W CM
20 series · 48 of 48 positions shown · IV contrast (7ML Gadavist)
Comparison: No prior abdominal MRI. CT the abdomen and pelvis
[DATE].

CLINICAL DATA: 74-year-old female with history of cystic mass of
the left kidney. Follow-up study.

EXAM:
MRI ABDOMEN WITHOUT AND WITH CONTRAST
TECHNIQUE: Multiplanar multisequence MR imaging of the abdomen was performed
both before and after the administration of intravenous contrast.
CONTRAST:  7mL GADAVIST GADOBUTROL 1 MMOL/ML IV SOLN

[Series 4: cor haste · coronal · 6.0mm · 1.19mm/px · 2 of 30 slices shown]
[im 1/30]
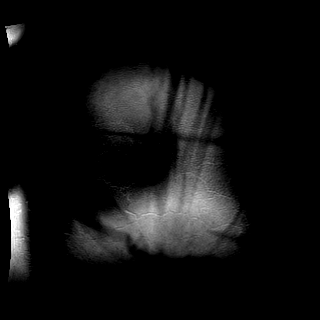
[im 30/30]
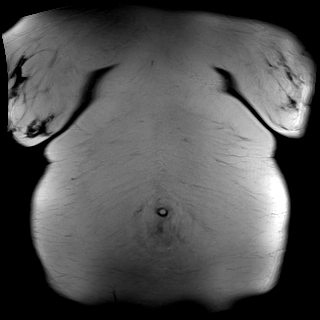

[Series 5: ax haste · axial · 6.0mm · 1.19mm/px · z∈[-171,+38]mm · 2 of 30 slices shown]
[im 1/30]
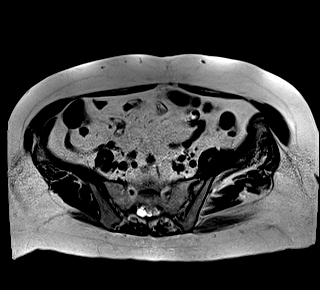
[im 30/30]
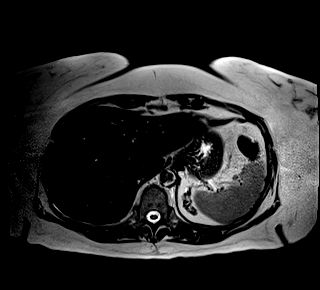

[Series 7: T2 fat-sat · axial · 6.0mm · 1.19mm/px · z∈[-167,+41]mm · 2 of 30 slices shown]
[im 1/30]
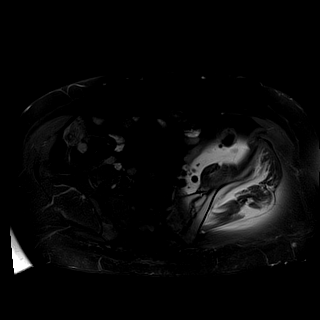
[im 30/30]
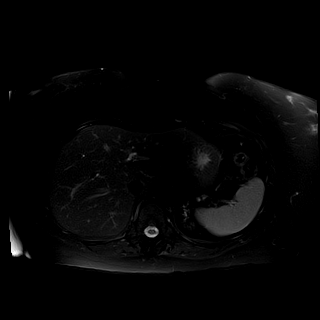

[Series 10: DWI · axial · 6.0mm · 1.42mm/px · z∈[-167,+41]mm · 2 of 30 slices shown (1 of 4)]
[im 1/30]
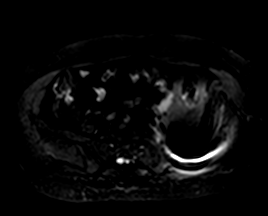
[im 30/30]
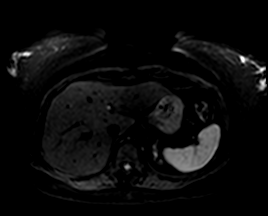

[Series 10: DWI · axial · 6.0mm · 1.42mm/px · 1 of 30 slices shown (2 of 4)]
[im 1/30]
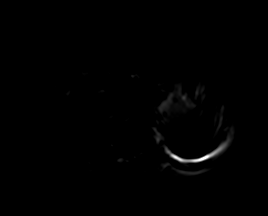

[Series 10: DWI · axial · 6.0mm · 1.42mm/px · 1 of 30 slices shown (3 of 4)]
[im 1/30]
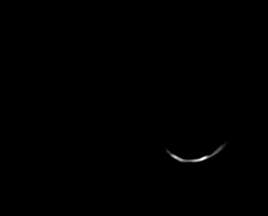

[Series 11: DWI · axial · 6.0mm · 1.42mm/px · 1 of 30 slices shown (4 of 4)]
[im 1/30]
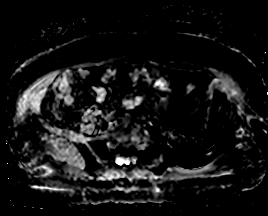

[Series 12: bSSFP · axial · 6.0mm · 0.74mm/px · 1 of 30 slices shown]
[im 1/30]
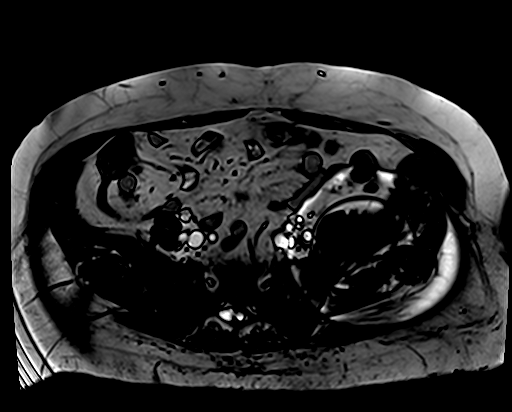

[Series 13: ax in and · axial · 3.5mm · 1.31mm/px · z∈[-155,+93]mm · 3 of 72 slices shown (1 of 2)]
[im 1/72]
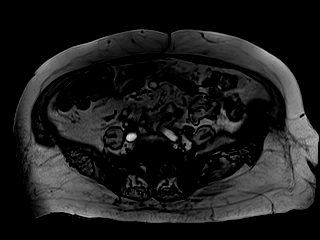
[im 36/72]
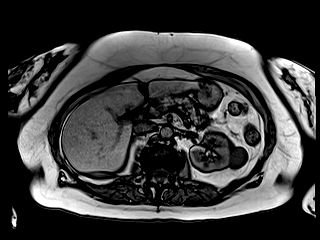
[im 72/72]
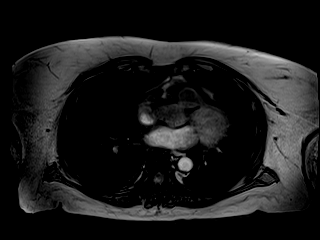

[Series 14: ax in and · axial · 3.5mm · 1.31mm/px · z∈[-155,+93]mm · 3 of 72 slices shown (2 of 2)]
[im 1/72]
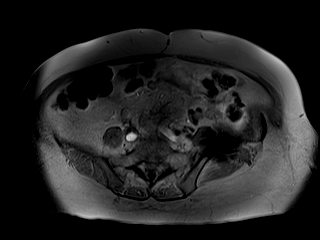
[im 36/72]
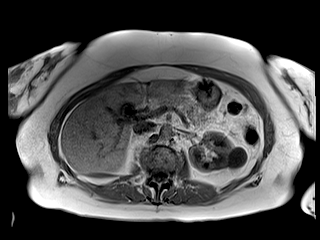
[im 72/72]
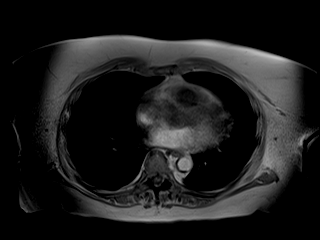

[Series 15: t1_vibe_fs_tra_p4_bh_pre · axial · 3.0mm · 1.34mm/px · z∈[-141,+72]mm · 3 of 72 slices shown]
[im 1/72]
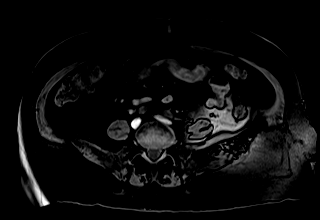
[im 36/72]
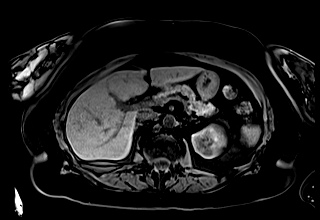
[im 72/72]
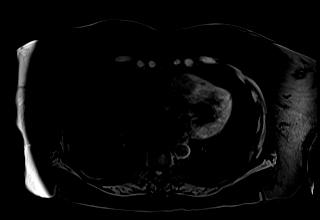

[Series 17: t1_vibe_fs_tra_p4_bh_post · axial · 3.0mm · 1.34mm/px · z∈[-141,+72]mm · 3 of 72 slices shown (1 of 4)]
[im 1/72]
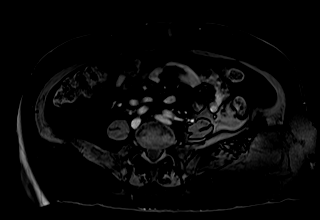
[im 36/72]
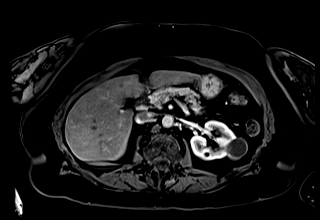
[im 72/72]
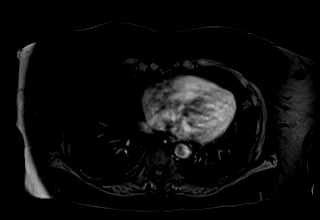

[Series 18: t1_vibe_fs_tra_p4_bh_post_sub · axial · 3.0mm · 1.34mm/px · z∈[-141,+72]mm · 3 of 72 slices shown (1 of 4)]
[im 1/72]
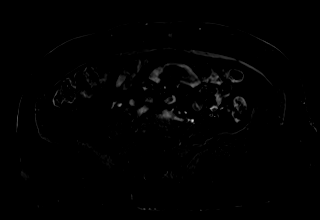
[im 36/72]
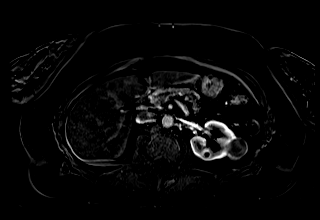
[im 72/72]
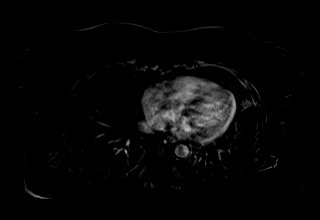

[Series 19: t1_vibe_fs_tra_p4_bh_post · axial · 3.0mm · 1.34mm/px · z∈[-141,+72]mm · 3 of 72 slices shown (2 of 4)]
[im 1/72]
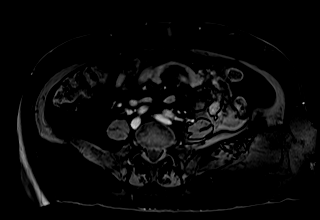
[im 36/72]
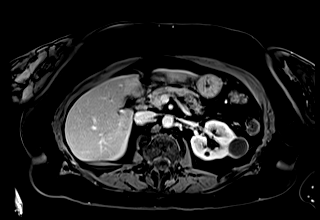
[im 72/72]
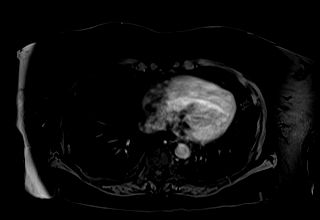

[Series 20: t1_vibe_fs_tra_p4_bh_post_sub · axial · 3.0mm · 1.34mm/px · z∈[-141,+72]mm · 3 of 72 slices shown (2 of 4)]
[im 1/72]
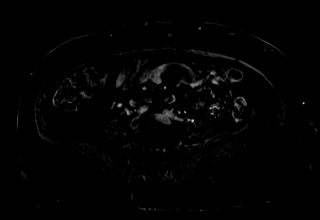
[im 36/72]
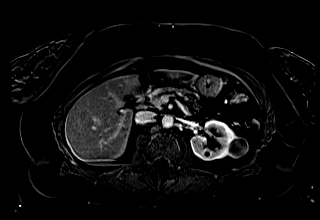
[im 72/72]
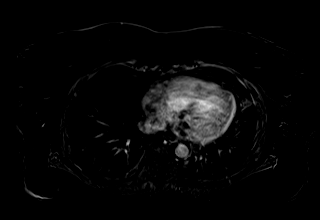

[Series 21: t1_vibe_fs_tra_p4_bh_post · axial · 3.0mm · 1.34mm/px · z∈[-141,+72]mm · 3 of 72 slices shown (3 of 4)]
[im 1/72]
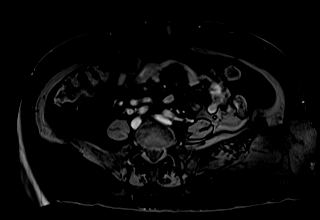
[im 36/72]
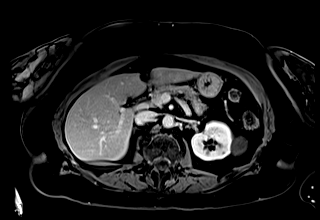
[im 72/72]
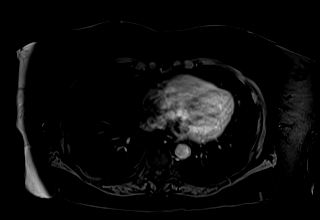

[Series 22: t1_vibe_fs_tra_p4_bh_post_sub · axial · 3.0mm · 1.34mm/px · z∈[-141,+72]mm · 3 of 72 slices shown (3 of 4)]
[im 1/72]
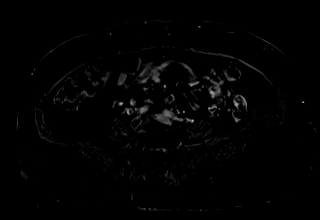
[im 36/72]
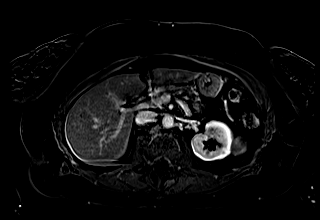
[im 72/72]
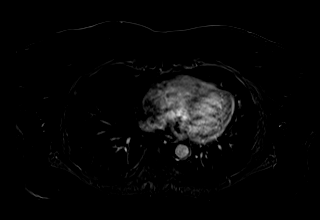

[Series 23: t1_vibe_fs_tra_p4_bh_post · axial · 3.0mm · 1.34mm/px · z∈[-141,+72]mm · 3 of 72 slices shown (4 of 4)]
[im 1/72]
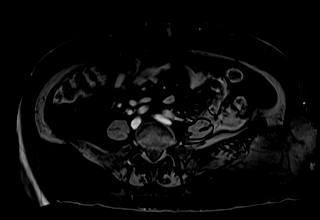
[im 36/72]
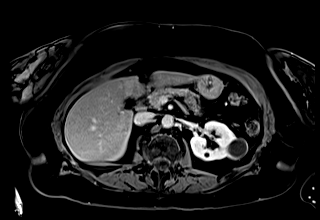
[im 72/72]
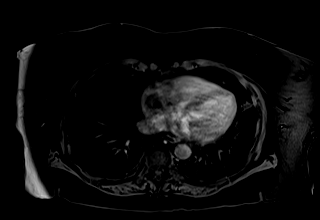

[Series 24: t1_vibe_fs_tra_p4_bh_post_sub · axial · 3.0mm · 1.34mm/px · z∈[-141,+72]mm · 3 of 72 slices shown (4 of 4)]
[im 1/72]
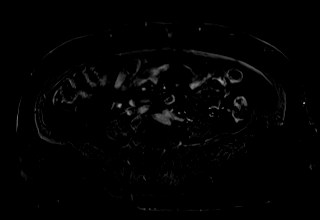
[im 36/72]
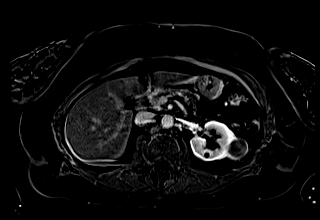
[im 72/72]
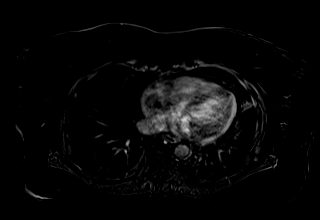

[Series 25: T1 dynamic post-contrast · coronal · 3.0mm · 1.31mm/px · 3 of 72 slices shown]
[im 1/72]
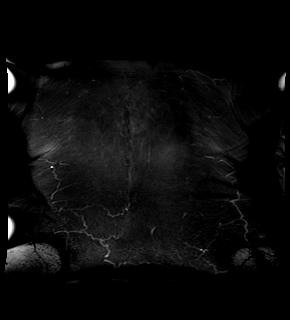
[im 36/72]
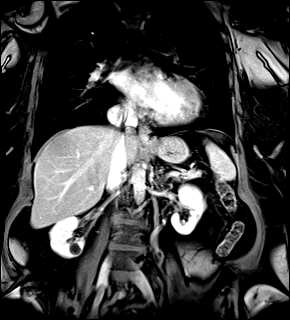
[im 72/72]
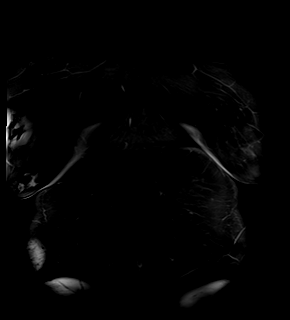

[48 of 48 positions shown; findings below may reference images not displayed]

FINDINGS: Lower chest: Unremarkable.

Hepatobiliary: No cystic or solid hepatic lesions. No intra or
extrahepatic biliary ductal dilatation. Status post cholecystectomy.

Pancreas: No pancreatic mass. No pancreatic ductal dilatation. No
pancreatic or peripancreatic fluid collections or inflammatory
changes.

Spleen:  Unremarkable.

Adrenals/Urinary Tract: There are multiple renal lesions
bilaterally, the majority of which are T1 hypointense, T2
hyperintense, and do not enhance, compatible with simple cysts. In
addition, in the lateral aspect of interpolar region of the left
kidney there is a complex lesion measuring 2.9 x 3.5 x 3.9 cm (axial
image 7 of series 5 and coronal image 11 of series 4), which is T1
hypointense with the exception of some T1 hyperintensity along the
inferior and medial margin, T2 hyperintense, has multiple internal
septations which are somewhat irregular, and diffuse septal
enhancement on post gadolinium imaging. No hydroureteronephrosis.
Right adrenal gland is normal in appearance. Signal void in the
upper left retroperitoneum corresponding to densely calcified left
adrenal gland.

Stomach/Bowel: Visualized portions are unremarkable.

Vascular/Lymphatic: Aortic atherosclerosis without evidence of
aneurysm identified in the visualized abdominal vasculature. No
lymphadenopathy noted in the abdomen.

Other: No significant volume of ascites noted in the visualized
portions of the peritoneal cavity.

Musculoskeletal: No aggressive appearing osseous lesions are noted
in the visualized portions of the skeleton.
IMPRESSION: 1. Complex lesion in the interpolar region of the left kidney
measuring 2.9 x 3.5 x 3.9 cm classified as a Bosniak class III cyst.
Urologic consultation is recommended.
2. Multiple other simple (Bosniak class I cysts) in the kidneys
bilaterally.
3. Aortic atherosclerosis.

## 2020-09-11 MED ORDER — PROCHLORPERAZINE MALEATE 10 MG PO TABS: 10.0000 mg | ORAL_TABLET | Freq: Four times a day (QID) | ORAL | 1 refills | Status: DC | PRN

## 2020-09-11 MED ORDER — GADOBUTROL 1 MMOL/ML IV SOLN
7.0000 mL | Freq: Once | INTRAVENOUS | Status: AC | PRN
  Administered 2020-09-11: 7 mL via INTRAVENOUS

## 2020-09-11 MED ORDER — LIDOCAINE-PRILOCAINE 2.5-2.5 % EX CREA: TOPICAL_CREAM | CUTANEOUS | 3 refills | Status: DC

## 2020-09-11 NOTE — Progress Notes (Signed)

## 2020-09-12 ENCOUNTER — Ambulatory Visit (HOSPITAL_COMMUNITY): Payer: Medicare Other

## 2020-09-15 ENCOUNTER — Other Ambulatory Visit: Payer: Self-pay | Admitting: *Deleted

## 2020-09-15 DIAGNOSIS — N289 Disorder of kidney and ureter, unspecified: Secondary | ICD-10-CM

## 2020-09-16 ENCOUNTER — Other Ambulatory Visit: Payer: Self-pay

## 2020-09-16 ENCOUNTER — Inpatient Hospital Stay (HOSPITAL_BASED_OUTPATIENT_CLINIC_OR_DEPARTMENT_OTHER): Payer: Medicare Other | Admitting: Hematology

## 2020-09-16 ENCOUNTER — Inpatient Hospital Stay (HOSPITAL_COMMUNITY): Payer: Medicare Other

## 2020-09-16 VITALS — BP 156/75 | HR 63 | Temp 97.6°F | Resp 16

## 2020-09-16 VITALS — BP 137/78 | HR 68 | Temp 97.2°F | Resp 18 | Wt 175.7 lb

## 2020-09-16 DIAGNOSIS — Z95828 Presence of other vascular implants and grafts: Secondary | ICD-10-CM

## 2020-09-16 DIAGNOSIS — C20 Malignant neoplasm of rectum: Secondary | ICD-10-CM

## 2020-09-16 DIAGNOSIS — Z5111 Encounter for antineoplastic chemotherapy: Secondary | ICD-10-CM | POA: Diagnosis not present

## 2020-09-16 LAB — COMPREHENSIVE METABOLIC PANEL
ALT: 19 U/L (ref 0–44)
AST: 26 U/L (ref 15–41)
Albumin: 4 g/dL (ref 3.5–5.0)
Alkaline Phosphatase: 65 U/L (ref 38–126)
Anion gap: 10 (ref 5–15)
BUN: 21 mg/dL (ref 8–23)
CO2: 24 mmol/L (ref 22–32)
Calcium: 9.2 mg/dL (ref 8.9–10.3)
Chloride: 107 mmol/L (ref 98–111)
Creatinine, Ser: 0.94 mg/dL (ref 0.44–1.00)
GFR calc Af Amer: >60 mL/min (ref >60)
GFR calc non Af Amer: 59 mL/min — ABNORMAL LOW (ref >60)
Glucose, Bld: 81 mg/dL (ref 70–99)
Potassium: 4 mmol/L (ref 3.5–5.1)
Sodium: 141 mmol/L (ref 135–145)
Total Bilirubin: 0.9 mg/dL (ref 0.3–1.2)
Total Protein: 7 g/dL (ref 6.5–8.1)

## 2020-09-16 LAB — CBC WITH DIFFERENTIAL/PLATELET
Abs Immature Granulocytes: 0.02 10*3/uL (ref 0.00–0.07)
Basophils Absolute: 0.1 10*3/uL (ref 0.0–0.1)
Basophils Relative: 1 %
Eosinophils Absolute: 0.3 10*3/uL (ref 0.0–0.5)
Eosinophils Relative: 4 %
HCT: 42 % (ref 36.0–46.0)
Hemoglobin: 13.7 g/dL (ref 12.0–15.0)
Immature Granulocytes: 0 %
Lymphocytes Relative: 35 %
Lymphs Abs: 2.5 10*3/uL (ref 0.7–4.0)
MCH: 30.9 pg (ref 26.0–34.0)
MCHC: 32.6 g/dL (ref 30.0–36.0)
MCV: 94.8 fL (ref 80.0–100.0)
Monocytes Absolute: 0.6 10*3/uL (ref 0.1–1.0)
Monocytes Relative: 9 %
Neutro Abs: 3.6 10*3/uL (ref 1.7–7.7)
Neutrophils Relative %: 51 %
Platelets: 218 10*3/uL (ref 150–400)
RBC: 4.43 MIL/uL (ref 3.87–5.11)
RDW: 14.8 % (ref 11.5–15.5)
WBC: 7.1 10*3/uL (ref 4.0–10.5)
nRBC: 0 % (ref 0.0–0.2)

## 2020-09-16 MED ORDER — DEXTROSE 5 % IV SOLN: Freq: Once | INTRAVENOUS | Status: AC

## 2020-09-16 MED ORDER — SODIUM CHLORIDE 0.9 % IV SOLN
2400.0000 mg/m2 | INTRAVENOUS | Status: DC
  Administered 2020-09-16: 4400 mg via INTRAVENOUS
  Filled 2020-09-16: qty 88

## 2020-09-16 MED ORDER — LEUCOVORIN CALCIUM INJECTION 350 MG
380.0000 mg/m2 | Freq: Once | INTRAVENOUS | Status: AC
  Administered 2020-09-16: 700 mg via INTRAVENOUS
  Filled 2020-09-16: qty 35

## 2020-09-16 MED ORDER — FLUOROURACIL CHEMO INJECTION 2.5 GM/50ML
400.0000 mg/m2 | Freq: Once | INTRAVENOUS | Status: AC
  Administered 2020-09-16: 750 mg via INTRAVENOUS
  Filled 2020-09-16: qty 15

## 2020-09-16 MED ORDER — PALONOSETRON HCL INJECTION 0.25 MG/5ML
0.2500 mg | Freq: Once | INTRAVENOUS | Status: AC
  Administered 2020-09-16: 0.25 mg via INTRAVENOUS
  Filled 2020-09-16: qty 5

## 2020-09-16 MED ORDER — SODIUM CHLORIDE 0.9% FLUSH: 10.0000 mL | INTRAVENOUS | Status: DC | PRN

## 2020-09-16 MED ORDER — SODIUM CHLORIDE 0.9 % IV SOLN
10.0000 mg | Freq: Once | INTRAVENOUS | Status: AC
  Administered 2020-09-16: 10 mg via INTRAVENOUS
  Filled 2020-09-16: qty 10

## 2020-09-16 MED ORDER — OXALIPLATIN CHEMO INJECTION 100 MG/20ML
68.0000 mg/m2 | Freq: Once | INTRAVENOUS | Status: AC
  Administered 2020-09-16: 125 mg via INTRAVENOUS
  Filled 2020-09-16: qty 20

## 2020-09-16 NOTE — Progress Notes (Signed)
Labs reviewed with MD today. Will proceed with Day one of treatment today as planned.   Ambulatory pump education done today.   Treatment given per orders. Patient tolerated it well without problems. Vitals stable and discharged home from clinic ambulatory in stable condition. Follow up as scheduled.

## 2020-09-16 NOTE — Patient Instructions (Signed)
[facility] at [facility] Discharge Instructions  Labs drawn from portacath today   Thank you for choosing [facility] at [facility] to provide your oncology and hematology care.  To afford each patient quality time with our provider, please arrive at least 15 minutes before your scheduled appointment time.   If you have a lab appointment with the Cancer Center please come in thru the Main Entrance and check in at the main information desk.  You need to re-schedule your appointment should you arrive 10 or more minutes late.  We strive to give you quality time with our providers, and arriving late affects you and other patients whose appointments are after yours.  Also, if you no show three or more times for appointments you may be dismissed from the clinic at the providers discretion.     Again, thank you for choosing [facility].  Our hope is that these requests will decrease the amount of time that you wait before being seen by our physicians.       _____________________________________________________________  Should you have questions after your visit to [facility], please contact our office at [phone] and follow the prompts.  Our office hours are 8:00 a.m. and 4:30 p.m. Monday - Friday.  Please note that voicemails left after 4:00 p.m. may not be returned until the following business day.  We are closed weekends and major holidays.  You do have access to a nurse 24-7, just call the main number to the clinic [phone] and do not press any options, hold on the line and a nurse will answer the phone.    For prescription refill requests, have your pharmacy contact our office and allow 72 hours.    Due to Covid, you will need to wear a mask upon entering the hospital. If you do not have a mask, a mask will be given to you at the Main Entrance upon arrival. For doctor visits, patients may have 1 support person age 18  or older with them. For treatment visits, patients can not have anyone with them due to social distancing guidelines and our immunocompromised population.     

## 2020-09-16 NOTE — Patient Instructions (Addendum)
[facility] at [facility] Discharge Instructions  You were seen today by Dr. Delton Coombes. He went over your recent results. You received your first chemo treatment. On the days after receiving your chemo and until the pump comes off, take the prochlorperazine every 6 hours to minimize nausea. Make sure you eat and drink everything at room temperature to prevent cold sensitivity. Drink plenty of water to prevent formation of mouth sores. Dr. Delton Coombes will see you back in 2 weeks for labs and follow up.   Thank you for choosing [facility] at [facility] to provide your oncology and hematology care.  To afford each patient quality time with our provider, please arrive at least 15 minutes before your scheduled appointment time.   If you have a lab appointment with the [facility] please come in thru the Main Entrance and check in at the main information desk  You need to re-schedule your appointment should you arrive 10 or more minutes late.  We strive to give you quality time with our providers, and arriving late affects you and other patients whose appointments are after yours.  Also, if you no show three or more times for appointments you may be dismissed from the clinic at the providers discretion.     Again, thank you for choosing [facility].  Our hope is that these requests will decrease the amount of time that you wait before being seen by our physicians.       _____________________________________________________________  Should you have questions after your visit to [facility], please contact our office at [phone] between the hours of 8:00 a.m. and 4:30 p.m.  Voicemails left after 4:00 p.m. will not be returned until the following business day.  For prescription refill requests, have your pharmacy contact our office and allow 72 hours.    Cancer Center Support Programs:   > Cancer Support Group  2nd Tuesday of  the month 1pm-2pm, Journey Room

## 2020-09-16 NOTE — Progress Notes (Signed)
[place], [place] [address]   CLINIC:  Medical Oncology/Hematology  PCP:  Roderic Scarce, MD [address] [name] / [place] [address] [phone]   REASON FOR VISIT:  Follow-up for rectal cancer  PRIOR THERAPY: Transanal resection of polyp in 10/2012  NGS Results: Not done  CURRENT THERAPY: FOLFOX and Aloxi  BRIEF ONCOLOGIC HISTORY:  Oncology History  Rectal cancer ([place])  08/14/2020 Initial Diagnosis   Rectal cancer ([place])   09/02/2020 Cancer Staging   Staging form: Colon and Rectum, AJCC 8th Edition - Clinical stage from 09/02/2020: Stage IIA (cT3, cN0, cM0) - Signed by [name], MD on 09/02/2020   09/16/2020 -  Chemotherapy   The patient had palonosetron (ALOXI) injection 0.25 mg, 0.25 mg, Intravenous,  Once, 0 of 8 cycles leucovorin 736 mg in dextrose 5 % 250 mL infusion, 400 mg/m2, Intravenous,  Once, 0 of 8 cycles oxaliplatin (ELOXATIN) 155 mg in dextrose 5 % 500 mL chemo infusion, 85 mg/m2, Intravenous,  Once, 0 of 8 cycles fluorouracil (ADRUCIL) chemo injection 750 mg, 400 mg/m2, Intravenous,  Once, 0 of 8 cycles fluorouracil (ADRUCIL) 4,400 mg in sodium chloride 0.9 % 62 mL chemo infusion, 2,400 mg/m2, Intravenous, 1 Day/Dose, 0 of 8 cycles  for chemotherapy treatment.      CANCER STAGING: Cancer Staging Rectal cancer [facility]) Staging form: Colon and Rectum, AJCC 8th Edition - Clinical stage from 09/02/2020: Stage IIA (cT3, cN0, cM0) - Signed by [name], MD on 09/02/2020   INTERVAL HISTORY:  [name], a 74 y.o. female, returns for routine follow-up and consideration for first cycle of chemotherapy. [name] was last seen on 09/02/2020.  Due for initiating cycle #1 of FOLFOX and Aloxi today.   Overall, she tells me she has been feeling pretty well. She endorses that she is feeling nervous today. The site around her port is still a little sore, though it is improving. She denies having any  numbness or tingling. She reports having 1 episode of bright red blood last week when she wiped after a BM, but no more such episodes.  Overall, she feels ready for first cycle of chemo today.    REVIEW OF SYSTEMS:  Review of Systems  Constitutional: Negative for appetite change and fatigue.  Gastrointestinal: Positive for blood in stool (1 episode of blood after wiping) and diarrhea.  Neurological: Negative for numbness.  Psychiatric/Behavioral: The patient is nervous/anxious.   All other systems reviewed and are negative.   PAST MEDICAL/SURGICAL HISTORY:  Past Medical History:  Diagnosis Date  . Arthritis   . COVID-19 03/2019  . GERD (gastroesophageal reflux disease)   . HLD (hyperlipidemia)   . HTN (hypertension)   . Port-A-Cath in place 09/09/2020  . 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 Sigmoid diverticulitis 9518   Uncomplicated   Past Surgical History:  Procedure Laterality Date  . BIOPSY  07/10/2020   Procedure: BIOPSY;  Surgeon: [name], MD;  [facility];  Service: Endoscopy;;  . CHOLECYSTECTOMY  1998  . COLON SURGERY  10/30/2012   Dr. Audrie Lia; transanal resection of 2 cm rectal polyp; pathology revealed superficially invasive moderately differentiated adenocarcinoma arising in adenomatous polyp of distal rectum.  . COLONOSCOPY  10/24/2013   Dr. West Carbo; with propofol; small granuloma and sutures distal rectum, small polyp mid rectum removed, sigmoid diverticulosis, otherwise normal exam.  No pathology received.  Recommended colonoscopy in 5 years.  . COLONOSCOPY  09/08/2012  Dr. West Carbo; with propofol; multi lobulated friable 3 cm mass distal rectum s/p multiple biopsies, scattered sigmoid diverticulosis, otherwise normal exam.  Pathology with adenomatous polyp.  . COLONOSCOPY WITH PROPOFOL N/A 07/10/2020   Procedure: COLONOSCOPY WITH PROPOFOL;  Surgeon: [name], MD;  Location: [facility];  Service: Endoscopy;  Laterality: N/A;  10:00am   . POLYPECTOMY  07/10/2020   Procedure: POLYPECTOMY;  Surgeon: [name], MD;  Location: [facility];  Service: Endoscopy;;  . PORTACATH PLACEMENT Left 09/09/2020   Procedure: INSERTION PORT-A-CATH;  Surgeon: Aviva Signs, MD;  Location: [facility] ORS;  Service: General;  Laterality: Left;  . REPLACEMENT TOTAL HIP W/  RESURFACING IMPLANTS Bilateral   . 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 REPLACEMENT TOTAL KNEE Right 12/03/2014  . TOTAL SHOULDER ARTHROPLASTY  04/26/2018    SOCIAL HISTORY:  Social History   Socioeconomic History  . Marital status: Married    Spouse name: Not on file  . Number of children: 2  . Years of education: Not on file  . Highest education level: Not on file  Occupational History    Employer: retired  Tobacco Use  . Smoking status: Never Smoker  . Smokeless tobacco: Never Used  Vaping Use  . Vaping Use: Never used  Substance and Sexual Activity  . Alcohol use: Never  . Drug use: Never  . Sexual activity: Not Currently  Other Topics Concern  . Not on file  Social History Narrative  . Not on file   Social Determinants of Health   Financial Resource Strain: Low Risk   . Difficulty of Paying Living Expenses: Not hard at all  Food Insecurity: No Food Insecurity  . Worried About Charity fundraiser in the Last Year: Never true  . Ran Out of Food in the Last Year: Never true  Transportation Needs: No Transportation Needs  . Lack of Transportation (Medical): No  . Lack of Transportation (Non-Medical): No  Physical Activity: Inactive  . Days of Exercise per Week: 0 days  . Minutes of Exercise per Session: 0 min  Stress: No Stress Concern Present  . Feeling of Stress : Only a little  Social Connections: Moderately Integrated  . Frequency of Communication with Friends and Family: More than three times a week  . Frequency of Social Gatherings with Friends and Family: More than three times a week  . Attends Religious Services: More than 4 times per  year  . Active Member of Clubs or Organizations: No  . Attends Archivist Meetings: Never  . Marital Status: Married  Human resources officer Violence: Not At Risk  . Fear of Current or Ex-Partner: No  . Emotionally Abused: No  . Physically Abused: No  . Sexually Abused: No    FAMILY HISTORY:  Family History  Problem Relation Age of Onset  . Arthritis Mother   . High blood pressure Mother   . Heart disease Father   . Breast cancer Maternal Aunt   . Stroke Maternal Grandmother   . Cancer Paternal Grandmother   . Diabetes Brother   . Colon cancer Neg Hx     CURRENT MEDICATIONS:  Current Outpatient Medications  Medication Sig Dispense Refill  . celecoxib (CELEBREX) 200 MG capsule Take 200 mg by mouth daily.    Marland Kitchen dexamethasone 10 mg in sodium chloride 0.9 % 50 mL Inject 10 mg into the vein every 14 (fourteen) days. Prior to chemotherapy administration    . fluorouracil CALGB 86578 in sodium chloride  0.9 % 150 mL Inject 2,400 mg/m2 into the vein over 48 hr.    . FLUOROURACIL IV Inject 400 mg/m2 into the vein every 14 (fourteen) days.    Marland Kitchen LEUCOVORIN CALCIUM IV Inject 400 mg/m2 into the vein every 14 (fourteen) days.    . Multiple Vitamin (MULTIVITAMIN WITH MINERALS) TABS tablet Take 1 tablet by mouth daily.    Marland Kitchen omega-3 acid ethyl esters (LOVAZA) 1 g capsule Take 2 g by mouth daily.    Marland Kitchen omeprazole (PRILOSEC) 20 MG capsule Take 20 mg by mouth daily.    . OXALIPLATIN IV Inject 85 mg/m2 into the vein every 14 (fourteen) days.    . palonosetron (ALOXI) 0.25 MG/5ML injection Inject 0.25 mg into the vein every 14 (fourteen) days. Prior to chemotherapy administration    . simvastatin (ZOCOR) 40 MG tablet Take 40 mg by mouth at bedtime.     . traMADol (ULTRAM) 50 MG tablet Take 1 tablet (50 mg total) by mouth every 6 (six) hours as needed. 20 tablet 0  . Turmeric 500 MG TABS Take 500 mg by mouth daily.    . valsartan (DIOVAN) 320 MG tablet Take 320 mg by mouth daily.    Marland Kitchen  lidocaine-prilocaine (EMLA) cream Apply a small amount to port a cath site and cover with plastic wrap 1 hour prior to chemotherapy appointments (Patient not taking: Reported on 09/16/2020) 30 g 3  . prochlorperazine (COMPAZINE) 10 MG tablet Take 1 tablet (10 mg total) by mouth every 6 (six) hours as needed (Nausea or vomiting). (Patient not taking: Reported on 09/16/2020) 30 tablet 1   No current facility-administered medications for this visit.    ALLERGIES:  No Known Allergies  PHYSICAL EXAM:  Performance status (ECOG): 0 - Asymptomatic  Vitals:   09/16/20 0828  BP: 137/78  Pulse: 68  Resp: 18  Temp: (!) 97.2 F (36.2 C)  SpO2: 100%   Wt Readings from Last 3 Encounters:  09/16/20 175 lb 11.2 oz (79.7 kg)  09/16/20 175 lb 11.2 oz (79.7 kg)  09/04/20 175 lb (79.4 kg)   Physical Exam Vitals reviewed.  Constitutional:      Appearance: Normal appearance. She is obese.  Cardiovascular:     Rate and Rhythm: Normal rate and regular rhythm.     Pulses: Normal pulses.     Heart sounds: Normal heart sounds.  Pulmonary:     Effort: Pulmonary effort is normal.     Breath sounds: Normal breath sounds.  Chest:     Comments: Port-a-Cath in L chest Abdominal:     Palpations: Abdomen is soft.     Tenderness: There is no abdominal tenderness.  Musculoskeletal:     Right lower leg: No edema.     Left lower leg: No edema.  Neurological:     General: No focal deficit present.     Mental Status: She is alert and oriented to person, place, and time.  Psychiatric:        Mood and Affect: Mood normal.        Behavior: Behavior normal.     LABORATORY DATA:  I have reviewed the labs as listed.  CBC Latest Ref Rng & Units 09/16/2020 08/18/2020  WBC 4.0 - 10.5 K/uL 7.1 7.3  Hemoglobin 12.0 - 15.0 g/dL 13.7 14.4  Hematocrit 36 - 46 % 42.0 45.7  Platelets 150 - 400 K/uL 218 275   CMP Latest Ref Rng & Units 09/16/2020 08/18/2020 07/21/2020  Glucose 70 - 99 mg/dL 81  101(H) -  BUN 8 - 23  mg/dL 21 23 -  Creatinine 0.44 - 1.00 mg/dL 0.94 0.99 0.97(H)  Sodium 135 - 145 mmol/L 141 138 -  Potassium 3.5 - 5.1 mmol/L 4.0 4.4 -  Chloride 98 - 111 mmol/L 107 107 -  CO2 22 - 32 mmol/L 24 25 -  Calcium 8.9 - 10.3 mg/dL 9.2 9.2 -  Total Protein 6.5 - 8.1 g/dL 7.0 7.5 -  Total Bilirubin 0.3 - 1.2 mg/dL 0.9 0.6 -  Alkaline Phos 38 - 126 U/L 65 73 -  AST 15 - 41 U/L 26 25 -  ALT 0 - 44 U/L 19 23 -    DIAGNOSTIC IMAGING:  I have independently reviewed the scans and discussed with the patient. MR PELVIS W WO CONTRAST  Result Date: 08/27/2020 CLINICAL DATA:  Rectal cancer. New diagnosis. Staging. History of transanal polypectomy. EXAM: MRI PELVIS WITHOUT CONTRAST TECHNIQUE: Multiplanar multisequence MR imaging of the pelvis was performed. No intravenous contrast was administered. Small amount of [place] gel was administered per rectum to optimize tumor evaluation. COMPARISON:  08/05/2020 chest abdomen and pelvic CTs. Colonoscopy report of 07/10/2020. This describes a 2.5 cm lesion 8 cm from the anal verge. FINDINGS: Moderate motion and susceptibility artifact degradation from patient's bilateral hip arthroplasties. Per technologist comments, the patient was severely sedated, unable to follow directions. TUMOR LOCATION Tumor distance from Anal Verge/Skin Surface: Approximately 8.4 cm, including on 23/1,007. Tumor distance to Internal Anal Sphincter: Approximately 4.2 cm TUMOR DESCRIPTION Circumferential Extent: Posteriorly positioned, centered at approximately the 6 o'clock location, including on 20/1,009 and 16/1022. Tumor Length: 2.2 cm craniocaudal on 20/1,007. 2.2 cm transverse on 20/1,009 T - CATEGORY Extension through Muscularis Propria: Minimal extension through the muscularis propria is suspected, including on images 14 and 16 of 1,022. T3b Shortest Distance of any tumor/node from Mesorectal Fascia: 1.0 cm on 15/1022. Extramural Vascular Invasion/Tumor Thrombus: No Invasion of Anterior Peritoneal  Reflection: No Involvement of Adjacent Organs or Pelvic Sidewall: No Levator Ani Involvement: No N - CATEGORY Mesorectal Lymph Nodes : No. 3 mm node at the 4 o'clock position on 16/1,009 is not pathologic by size criteria. Extra-mesorectal Lymphadenopathy: No Other: No significant free fluid. Extensive sigmoid diverticulosis. 1.9 cm left-sided uterine fibroid. IMPRESSION: Moderate motion and artifact degradation. Rectal adenocarcinoma T stage: T3b Rectal adenocarcinoma N stage:  N0 Distance from tumor to the internal anal sphincter is 4.2 cm. Electronically Signed   By: Abigail Miyamoto M.D.   On: 08/27/2020 16:19   MR Abdomen W Wo Contrast  Result Date: 09/12/2020 CLINICAL DATA:  74 year old female with history of cystic mass of the left kidney. Follow-up study. EXAM: MRI ABDOMEN WITHOUT AND WITH CONTRAST TECHNIQUE: Multiplanar multisequence MR imaging of the abdomen was performed both before and after the administration of intravenous contrast. CONTRAST:  24mL GADAVIST GADOBUTROL 1 MMOL/ML IV SOLN COMPARISON:  No prior abdominal MRI. CT the abdomen and pelvis 08/05/2020. FINDINGS: Lower chest: Unremarkable. Hepatobiliary: No cystic or solid hepatic lesions. No intra or extrahepatic biliary ductal dilatation. Status post cholecystectomy. Pancreas: No pancreatic mass. No pancreatic ductal dilatation. No pancreatic or peripancreatic fluid collections or inflammatory changes. Spleen:  Unremarkable. Adrenals/Urinary Tract: There are multiple renal lesions bilaterally, the majority of which are T1 hypointense, T2 hyperintense, and do not enhance, compatible with simple cysts. In addition, in the lateral aspect of interpolar region of the left kidney there is a complex lesion measuring 2.9 x 3.5 x 3.9 cm (axial image 7 of series 5 and  coronal image 11 of series 4), which is T1 hypointense with the exception of some T1 hyperintensity along the inferior and medial margin, T2 hyperintense, has multiple internal septations  which are somewhat irregular, and diffuse septal enhancement on post gadolinium imaging. No hydroureteronephrosis. Right adrenal gland is normal in appearance. Signal void in the upper left retroperitoneum corresponding to densely calcified left adrenal gland. Stomach/Bowel: Visualized portions are unremarkable. Vascular/Lymphatic: Aortic atherosclerosis without evidence of aneurysm identified in the visualized abdominal vasculature. No lymphadenopathy noted in the abdomen. Other: No significant volume of ascites noted in the visualized portions of the peritoneal cavity. Musculoskeletal: No aggressive appearing osseous lesions are noted in the visualized portions of the skeleton. IMPRESSION: 1. 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 Urologic consultation is recommended. 2. Multiple other simple (Bosniak class I cysts) in the kidneys bilaterally. 3. Aortic atherosclerosis. Electronically Signed   By: Vinnie Langton M.D.   On: 09/12/2020 08:35   DG Chest Port 1 View  Result Date: 09/09/2020 CLINICAL DATA:  Left Port-A-Cath insertion EXAM: PORTABLE CHEST 1 VIEW COMPARISON:  None. FINDINGS: Left Port-A-Cath in place with the tip in the SVC. No pneumothorax. Heart and mediastinal contours are within normal limits. No focal opacities or effusions. No acute bony abnormality. IMPRESSION: Left Port-A-Cath in place without pneumothorax. No active disease. Electronically Signed   By: Rolm Baptise M.D.   On: 09/09/2020 10:54   DG C-Arm 1-60 Min-No Report  Result Date: 09/09/2020 Fluoroscopy was utilized by the requesting physician.  No radiographic interpretation.     ASSESSMENT:  1. Rectal adenocarcinoma: -History of rectal polyp with adenocarcinoma, status post transanal resection of the polyp in November 2013. -Colonoscopy on July 10, 2020 shows 2.5 cm centrally depressed ulcerated lesion with heaped up circular margin,  8 cm from anal verge. Distally, just above the anal verge, suture line consistent with history of prior transanal polypectomy. -Pathology of the rectal mass consistent with adenocarcinoma. Splenic flexure polypectomy was tubular adenoma. Sigmoid colon biopsy was benign. -CT CAP with contrast on August 05, 2020 shows no evidence of neoplastic disease within the chest, abdomen or pelvis. Complicated left midpole renal cyst versus complex cystic mass. No lung masses. -Pelvis MRI on 08/27/2020 showed T3BN0 tumor.  Distance of tumor/node from mesorectal fascia was 1 cm.  Distance from tumor to the internal anal sphincter was 4.2 cm.  Tumor size is 2.2 x 2.2 cm. -Total neoadjuvant therapy with 4 months of FOLFOX followed by long course chemo RT with Xeloda followed by restaging pelvis MRI 6 to 8 weeks after completion of XRT followed by resection recommended.  2. Social/family history: -She lives at home with her husband. She worked as a Counselling psychologist for 47 years. No history of smoking. -Maternal aunt had breast cancer.   PLAN:  1.   Stage II (T3N0) rectal adenocarcinoma: -I have reviewed her labs.  LFTs and CBC are adequate to proceed with treatment. -I talked about side effects in detail. -I will cut back on oxaliplatin dose by 20%.  If she tolerates it well, will increase it to regular dose. -She will be seen back in 2 weeks for follow-up.  2. Left kidney lesion: -MRI of the abdomen showed a 3.5 x 3.9 x 2.9 cm Bosniak class III cyst.  She has follow-up with urology.   Orders placed this encounter:  No orders of the defined types were placed in  this encounter.    [name], MD [facility] [phone]   I, Milinda Antis, am acting as a scribe for Dr. Sanda Linger.  I, [name] MD, have reviewed the above documentation for accuracy and completeness, and I agree with the above.

## 2020-09-16 NOTE — Progress Notes (Signed)
.   Pharmacist Chemotherapy Monitoring - Initial Assessment    Anticipated start date: 09/16/20   Regimen:  . Are orders appropriate based on the patient's diagnosis, regimen, and cycle? Yes . Does the plan date match the patient's scheduled date? Yes . Is the sequencing of drugs appropriate? Yes . Are the premedications appropriate for the patient's regimen? Yes . Prior Authorization for treatment is: Approved o If applicable, is the correct biosimilar selected based on the patient's insurance? not applicable  Organ Function and Labs: Marland Kitchen Are dose adjustments needed based on the patient's renal function, hepatic function, or hematologic function? No . Are appropriate labs ordered prior to the start of patient's treatment? Yes . Other organ system assessment, if indicated: N/A . The following baseline labs, if indicated, have been ordered: N/A  Dose Assessment: . Are the drug doses appropriate? Yes . Are the following correct: o Drug concentrations Yes o IV fluid compatible with drug Yes o Administration routes Yes o Timing of therapy Yes . If applicable, does the patient have documented access for treatment and/or plans for port-a-cath placement? yes . If applicable, have lifetime cumulative doses been properly documented and assessed? no Lifetime Dose Tracking  No doses have been documented on this patient for the following tracked chemicals: Doxorubicin, Epirubicin, Idarubicin, Daunorubicin, Mitoxantrone, Bleomycin, Oxaliplatin, Carboplatin, Liposomal Doxorubicin  o   Toxicity Monitoring/Prevention: . The patient has the following take home antiemetics prescribed: Prochlorperazine . The patient has the following take home medications prescribed: N/A . Medication allergies and previous infusion related reactions, if applicable, have been reviewed and addressed. Yes . The patient's current medication list has been assessed for drug-drug interactions with their chemotherapy regimen.  no significant drug-drug interactions were identified on review.  Order Review: . Are the treatment plan orders signed? No . Is the patient scheduled to see a provider prior to their treatment? Yes  I verify that I have reviewed each item in the above checklist and answered each question accordingly.  Wynona Neat 09/16/2020 9:03 AM

## 2020-09-16 NOTE — Progress Notes (Signed)
Patient was assessed by Dr. Katragadda and labs have been reviewed.  Patient is okay to proceed with treatment today. Primary RN and pharmacy aware.   

## 2020-09-16 NOTE — Patient Instructions (Signed)
[facility] Discharge Instructions for Patients Receiving Chemotherapy  Today you received the following chemotherapy agents   To help prevent nausea and vomiting after your treatment, we encourage you to take your nausea medication   If you develop nausea and vomiting that is not controlled by your nausea medication, call the clinic.   BELOW ARE SYMPTOMS THAT SHOULD BE REPORTED IMMEDIATELY:  *FEVER GREATER THAN 100.5 F  *CHILLS WITH OR WITHOUT FEVER  NAUSEA AND VOMITING THAT IS NOT CONTROLLED WITH YOUR NAUSEA MEDICATION  *UNUSUAL SHORTNESS OF BREATH  *UNUSUAL BRUISING OR BLEEDING  TENDERNESS IN MOUTH AND THROAT WITH OR WITHOUT PRESENCE OF ULCERS  *URINARY PROBLEMS  *BOWEL PROBLEMS  UNUSUAL RASH Items with * indicate a potential emergency and should be followed up as soon as possible.  Feel free to call the clinic should you have any questions or concerns. The clinic phone number is [phone].  Please show the CHEMO ALERT CARD at check-in to the Emergency Department and triage nurse.   

## 2020-09-18 ENCOUNTER — Encounter (HOSPITAL_COMMUNITY): Payer: Self-pay

## 2020-09-18 ENCOUNTER — Other Ambulatory Visit: Payer: Self-pay

## 2020-09-18 ENCOUNTER — Inpatient Hospital Stay (HOSPITAL_COMMUNITY): Payer: Medicare Other

## 2020-09-18 VITALS — BP 131/75 | HR 71 | Temp 97.1°F | Resp 18

## 2020-09-18 DIAGNOSIS — C20 Malignant neoplasm of rectum: Secondary | ICD-10-CM

## 2020-09-18 DIAGNOSIS — Z5111 Encounter for antineoplastic chemotherapy: Secondary | ICD-10-CM | POA: Diagnosis not present

## 2020-09-18 DIAGNOSIS — Z95828 Presence of other vascular implants and grafts: Secondary | ICD-10-CM

## 2020-09-18 MED ORDER — SODIUM CHLORIDE 0.9% FLUSH
10.0000 mL | INTRAVENOUS | Status: DC | PRN
  Administered 2020-09-18: 10 mL

## 2020-09-18 MED ORDER — HEPARIN SOD (PORK) LOCK FLUSH 100 UNIT/ML IV SOLN
500.0000 [IU] | Freq: Once | INTRAVENOUS | Status: AC | PRN
  Administered 2020-09-18: 500 [IU]

## 2020-09-18 NOTE — Patient Instructions (Signed)
[place] at [facility] Discharge Instructions  5FU pump discontinued with portacath flushed. Follow-up as scheduled   Thank you for choosing [place] at [facility] to provide your oncology and hematology care.  To afford each patient quality time with our provider, please arrive at least 15 minutes before your scheduled appointment time.   If you have a lab appointment with the [facility] please come in thru the Main Entrance and check in at the main information desk.  You need to re-schedule your appointment should you arrive 10 or more minutes late.  We strive to give you quality time with our providers, and arriving late affects you and other patients whose appointments are after yours.  Also, if you no show three or more times for appointments you may be dismissed from the clinic at the providers discretion.     Again, thank you for choosing [facility].  Our hope is that these requests will decrease the amount of time that you wait before being seen by our physicians.       _____________________________________________________________  Should you have questions after your visit to [facility], please contact our office at [phone] and follow the prompts.  Our office hours are 8:00 a.m. and 4:30 p.m. Monday - Friday.  Please note that voicemails left after 4:00 p.m. may not be returned until the following business day.  We are closed weekends and major holidays.  You do have access to a nurse 24-7, just call the main number to the clinic [phone] and do not press any options, hold on the line and a nurse will answer the phone.    For prescription refill requests, have your pharmacy contact our office and allow 72 hours.    Due to Covid, you will need to wear a mask upon entering the hospital. If you do not have a mask, a mask will be given to you at the Main Entrance upon arrival. For doctor visits,  patients may have 1 support person age 44 or older with them. For treatment visits, patients can not have anyone with them due to social distancing guidelines and our immunocompromised population.

## 2020-09-18 NOTE — Progress Notes (Signed)
Brenda Mclean tolerated 5FU pump well without complaints or incident. 5FU pump discontinued with portacath flushed easily per protocol and de-accessed. VSS Pt discharged self ambulatory in satisfactory condition

## 2020-09-19 ENCOUNTER — Telehealth (HOSPITAL_COMMUNITY): Payer: Self-pay

## 2020-09-19 NOTE — Telephone Encounter (Signed)
24 hour follow up- patient is doing good, no problems at this time.

## 2020-09-30 ENCOUNTER — Inpatient Hospital Stay (HOSPITAL_COMMUNITY): Payer: Medicare Other

## 2020-09-30 ENCOUNTER — Inpatient Hospital Stay (HOSPITAL_COMMUNITY): Payer: Medicare Other | Attending: Hematology

## 2020-09-30 ENCOUNTER — Other Ambulatory Visit: Payer: Self-pay

## 2020-09-30 ENCOUNTER — Encounter (HOSPITAL_COMMUNITY): Payer: Self-pay | Admitting: Hematology

## 2020-09-30 ENCOUNTER — Inpatient Hospital Stay (HOSPITAL_BASED_OUTPATIENT_CLINIC_OR_DEPARTMENT_OTHER): Payer: Medicare Other | Admitting: Hematology

## 2020-09-30 VITALS — BP 141/72 | HR 78 | Temp 97.2°F | Resp 18 | Wt 176.1 lb

## 2020-09-30 DIAGNOSIS — C20 Malignant neoplasm of rectum: Secondary | ICD-10-CM | POA: Diagnosis present

## 2020-09-30 DIAGNOSIS — Z452 Encounter for adjustment and management of vascular access device: Secondary | ICD-10-CM | POA: Diagnosis not present

## 2020-09-30 DIAGNOSIS — E876 Hypokalemia: Secondary | ICD-10-CM | POA: Diagnosis not present

## 2020-09-30 DIAGNOSIS — Z5189 Encounter for other specified aftercare: Secondary | ICD-10-CM | POA: Insufficient documentation

## 2020-09-30 DIAGNOSIS — Z5111 Encounter for antineoplastic chemotherapy: Secondary | ICD-10-CM | POA: Diagnosis present

## 2020-09-30 DIAGNOSIS — Z95828 Presence of other vascular implants and grafts: Secondary | ICD-10-CM

## 2020-09-30 DIAGNOSIS — D702 Other drug-induced agranulocytosis: Secondary | ICD-10-CM | POA: Insufficient documentation

## 2020-09-30 LAB — CBC WITH DIFFERENTIAL/PLATELET
Abs Immature Granulocytes: 0 10*3/uL (ref 0.00–0.07)
Basophils Absolute: 0 10*3/uL (ref 0.0–0.1)
Basophils Relative: 1 %
Eosinophils Absolute: 0.3 10*3/uL (ref 0.0–0.5)
Eosinophils Relative: 16 %
HCT: 35.3 % — ABNORMAL LOW (ref 36.0–46.0)
Hemoglobin: 11.6 g/dL — ABNORMAL LOW (ref 12.0–15.0)
Immature Granulocytes: 0 %
Lymphocytes Relative: 53 %
Lymphs Abs: 1 10*3/uL (ref 0.7–4.0)
MCH: 30.9 pg (ref 26.0–34.0)
MCHC: 32.9 g/dL (ref 30.0–36.0)
MCV: 93.9 fL (ref 80.0–100.0)
Monocytes Absolute: 0.2 10*3/uL (ref 0.1–1.0)
Monocytes Relative: 11 %
Neutro Abs: 0.4 10*3/uL — ABNORMAL LOW (ref 1.7–7.7)
Neutrophils Relative %: 19 %
Platelets: 216 10*3/uL (ref 150–400)
RBC: 3.76 MIL/uL — ABNORMAL LOW (ref 3.87–5.11)
RDW: 15.1 % (ref 11.5–15.5)
WBC: 1.9 10*3/uL — ABNORMAL LOW (ref 4.0–10.5)
nRBC: 0 % (ref 0.0–0.2)

## 2020-09-30 LAB — COMPREHENSIVE METABOLIC PANEL
ALT: 67 U/L — ABNORMAL HIGH (ref 0–44)
AST: 47 U/L — ABNORMAL HIGH (ref 15–41)
Albumin: 3.4 g/dL — ABNORMAL LOW (ref 3.5–5.0)
Alkaline Phosphatase: 129 U/L — ABNORMAL HIGH (ref 38–126)
Anion gap: 8 (ref 5–15)
BUN: 12 mg/dL (ref 8–23)
CO2: 26 mmol/L (ref 22–32)
Calcium: 8.9 mg/dL (ref 8.9–10.3)
Chloride: 109 mmol/L (ref 98–111)
Creatinine, Ser: 0.85 mg/dL (ref 0.44–1.00)
GFR calc non Af Amer: >60 mL/min (ref >60)
Glucose, Bld: 92 mg/dL (ref 70–99)
Potassium: 3.2 mmol/L — ABNORMAL LOW (ref 3.5–5.1)
Sodium: 143 mmol/L (ref 135–145)
Total Bilirubin: 0.6 mg/dL (ref 0.3–1.2)
Total Protein: 5.8 g/dL — ABNORMAL LOW (ref 6.5–8.1)

## 2020-09-30 MED ORDER — SODIUM CHLORIDE 0.9% FLUSH
10.0000 mL | Freq: Once | INTRAVENOUS | Status: AC
  Administered 2020-09-30: 10 mL via INTRAVENOUS

## 2020-09-30 MED ORDER — POTASSIUM CHLORIDE CRYS ER 20 MEQ PO TBCR
20.0000 meq | EXTENDED_RELEASE_TABLET | Freq: Once | ORAL | Status: AC
  Administered 2020-09-30: 20 meq via ORAL
  Filled 2020-09-30: qty 1

## 2020-09-30 MED ORDER — FILGRASTIM-SNDZ 480 MCG/0.8ML IJ SOSY
480.0000 ug | PREFILLED_SYRINGE | Freq: Once | INTRAMUSCULAR | Status: AC
  Administered 2020-09-30: 480 ug via SUBCUTANEOUS
  Filled 2020-09-30: qty 0.8

## 2020-09-30 MED ORDER — HEPARIN SOD (PORK) LOCK FLUSH 100 UNIT/ML IV SOLN
500.0000 [IU] | Freq: Once | INTRAVENOUS | Status: AC
  Administered 2020-09-30: 500 [IU] via INTRAVENOUS

## 2020-09-30 MED ORDER — POTASSIUM CHLORIDE CRYS ER 20 MEQ PO TBCR
20.0000 meq | EXTENDED_RELEASE_TABLET | Freq: Once | ORAL | Status: AC
  Administered 2020-09-30: 20 meq via ORAL

## 2020-09-30 MED ORDER — POTASSIUM CHLORIDE CRYS ER 20 MEQ PO TBCR
EXTENDED_RELEASE_TABLET | ORAL | Status: AC
  Filled 2020-09-30: qty 1

## 2020-09-30 NOTE — Progress Notes (Signed)
[place], [place] [address]   CLINIC:  Medical Oncology/Hematology  PCP:  Roderic Scarce, MD [address] [name] / [place] [address] [phone]   REASON FOR VISIT:  Follow-up for rectal cancer  PRIOR THERAPY: Transanal resection of polyp in 10/2012  NGS Results: Not done  CURRENT THERAPY: FOLFOX and Aloxi  BRIEF ONCOLOGIC HISTORY:  Oncology History  Rectal cancer ([place])  08/14/2020 Initial Diagnosis   Rectal cancer ([place])   09/02/2020 Cancer Staging   Staging form: Colon and Rectum, AJCC 8th Edition - Clinical stage from 09/02/2020: Stage IIA (cT3, cN0, cM0) - Signed by [name], MD on 09/02/2020   09/16/2020 -  Chemotherapy   The patient had palonosetron (ALOXI) injection 0.25 mg, 0.25 mg, Intravenous,  Once, 1 of 8 cycles Administration: 0.25 mg (09/16/2020) leucovorin 700 mg in dextrose 5 % 250 mL infusion, 380 mg/m2 = 736 mg, Intravenous,  Once, 1 of 8 cycles Administration: 700 mg (09/16/2020) oxaliplatin (ELOXATIN) 125 mg in dextrose 5 % 500 mL chemo infusion, 68 mg/m2 = 125 mg (80 % of original dose 85 mg/m2), Intravenous,  Once, 1 of 8 cycles Dose modification: 68 mg/m2 (80 % of original dose 85 mg/m2, Cycle 1, Reason: Provider Judgment) Administration: 125 mg (09/16/2020) fluorouracil (ADRUCIL) chemo injection 750 mg, 400 mg/m2 = 750 mg, Intravenous,  Once, 1 of 8 cycles Administration: 750 mg (09/16/2020) fluorouracil (ADRUCIL) 4,400 mg in sodium chloride 0.9 % 62 mL chemo infusion, 2,400 mg/m2 = 4,400 mg, Intravenous, 1 Day/Dose, 1 of 8 cycles Administration: 4,400 mg (09/16/2020)  for chemotherapy treatment.      CANCER STAGING: Cancer Staging Rectal cancer [facility]) Staging form: Colon and Rectum, AJCC 8th Edition - Clinical stage from 09/02/2020: Stage IIA (cT3, cN0, cM0) - Signed by [name], MD on 09/02/2020   INTERVAL HISTORY:  Brenda Mclean, a 74 y.o. female, returns for routine follow-up  and consideration for next cycle of chemotherapy. [name] was last seen on 09/16/2020.  Due for cycle #2 of FOLFOX and Aloxi today.   Overall, she tells me she has been feeling pretty well. She tolerated the previous treatment well and she is careful not to touch anything cold. She denies having any numbness, fatigue, easy bleeding, N/V/D, constipation, or mouth sores. Her appetite is great.   REVIEW OF SYSTEMS:  Review of Systems  Constitutional: Negative for appetite change and fatigue.  HENT:   Negative for mouth sores.   Gastrointestinal: Negative for constipation, diarrhea, nausea and vomiting.  Neurological: Negative for numbness.  Hematological: Does not bruise/bleed easily.  All other systems reviewed and are negative.   PAST MEDICAL/SURGICAL HISTORY:  Past Medical History:  Diagnosis Date  . Arthritis   . COVID-19 03/2019  . GERD (gastroesophageal reflux disease)   . HLD (hyperlipidemia)   . HTN (hypertension)   . Port-A-Cath in place 09/09/2020  . 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 Sigmoid diverticulitis 9390   Uncomplicated   Past Surgical History:  Procedure Laterality Date  . BIOPSY  07/10/2020   Procedure: BIOPSY;  Surgeon: [name], MD;  Location: [facility];  Service: Endoscopy;;  . CHOLECYSTECTOMY  1998  . COLON SURGERY  10/30/2012   Dr. Audrie Lia; transanal resection of 2 cm rectal polyp; pathology revealed superficially invasive moderately differentiated adenocarcinoma arising in adenomatous polyp of distal rectum.  . COLONOSCOPY  10/24/2013   Dr. West Carbo; with propofol; small granuloma and sutures distal rectum, small polyp  mid rectum removed, sigmoid diverticulosis, otherwise normal exam.  No pathology received.  Recommended colonoscopy in 5 years.  . COLONOSCOPY  09/08/2012   Dr. West Carbo; with propofol; multi lobulated friable 3 cm mass distal rectum s/p multiple biopsies, scattered sigmoid diverticulosis, otherwise normal exam.  Pathology with  adenomatous polyp.  . COLONOSCOPY WITH PROPOFOL N/A 07/10/2020   Procedure: COLONOSCOPY WITH PROPOFOL;  Surgeon: [name], MD;  Location: [facility];  Service: Endoscopy;  Laterality: N/A;  10:00am  . POLYPECTOMY  07/10/2020   Procedure: POLYPECTOMY;  Surgeon: [name], MD;  Location: [facility];  Service: Endoscopy;;  . PORTACATH PLACEMENT Left 09/09/2020   Procedure: INSERTION PORT-A-CATH;  Surgeon: Aviva Signs, MD;  Location: [facility] ORS;  Service: General;  Laterality: Left;  . REPLACEMENT TOTAL HIP W/  RESURFACING IMPLANTS Bilateral   . 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 REPLACEMENT TOTAL KNEE Right 12/03/2014  . TOTAL SHOULDER ARTHROPLASTY  04/26/2018    SOCIAL HISTORY:  Social History   Socioeconomic History  . Marital status: Married    Spouse name: Not on file  . Number of children: 2  . Years of education: Not on file  . Highest education level: Not on file  Occupational History    Employer: retired  Tobacco Use  . Smoking status: Never Smoker  . Smokeless tobacco: Never Used  Vaping Use  . Vaping Use: Never used  Substance and Sexual Activity  . Alcohol use: Never  . Drug use: Never  . Sexual activity: Not Currently  Other Topics Concern  . Not on file  Social History Narrative  . Not on file   Social Determinants of Health   Financial Resource Strain: Low Risk   . Difficulty of Paying Living Expenses: Not hard at all  Food Insecurity: No Food Insecurity  . Worried About Charity fundraiser in the Last Year: Never true  . Ran Out of Food in the Last Year: Never true  Transportation Needs: No Transportation Needs  . Lack of Transportation (Medical): No  . Lack of Transportation (Non-Medical): No  Physical Activity: Inactive  . Days of Exercise per Week: 0 days  . Minutes of Exercise per Session: 0 min  Stress: No Stress Concern Present  . Feeling of Stress : Only a little  Social Connections: Moderately Integrated  . Frequency of  Communication with Friends and Family: More than three times a week  . Frequency of Social Gatherings with Friends and Family: More than three times a week  . Attends Religious Services: More than 4 times per year  . Active Member of Clubs or Organizations: No  . Attends Archivist Meetings: Never  . Marital Status: Married  Human resources officer Violence: Not At Risk  . Fear of Current or Ex-Partner: No  . Emotionally Abused: No  . Physically Abused: No  . Sexually Abused: No    FAMILY HISTORY:  Family History  Problem Relation Age of Onset  . Arthritis Mother   . High blood pressure Mother   . Heart disease Father   . Breast cancer Maternal Aunt   . Stroke Maternal Grandmother   . Cancer Paternal Grandmother   . Diabetes Brother   . Colon cancer Neg Hx     CURRENT MEDICATIONS:  Current Outpatient Medications  Medication Sig Dispense Refill  . celecoxib (CELEBREX) 200 MG capsule Take 200 mg by mouth daily.    Marland Kitchen dexamethasone 10 mg in sodium chloride 0.9 % 50  mL Inject 10 mg into the vein every 14 (fourteen) days. Prior to chemotherapy administration    . fluorouracil CALGB 97353 in sodium chloride 0.9 % 150 mL Inject 2,400 mg/m2 into the vein over 48 hr.    . FLUOROURACIL IV Inject 400 mg/m2 into the vein every 14 (fourteen) days.    Marland Kitchen LEUCOVORIN CALCIUM IV Inject 400 mg/m2 into the vein every 14 (fourteen) days.    Marland Kitchen lidocaine-prilocaine (EMLA) cream Apply a small amount to port a cath site and cover with plastic wrap 1 hour prior to chemotherapy appointments (Patient not taking: Reported on 09/16/2020) 30 g 3  . Multiple Vitamin (MULTIVITAMIN WITH MINERALS) TABS tablet Take 1 tablet by mouth daily.    Marland Kitchen omega-3 acid ethyl esters (LOVAZA) 1 g capsule Take 2 g by mouth daily.    Marland Kitchen omeprazole (PRILOSEC) 20 MG capsule Take 20 mg by mouth daily.    . OXALIPLATIN IV Inject 85 mg/m2 into the vein every 14 (fourteen) days.    . palonosetron (ALOXI) 0.25 MG/5ML injection Inject  0.25 mg into the vein every 14 (fourteen) days. Prior to chemotherapy administration    . prochlorperazine (COMPAZINE) 10 MG tablet Take 1 tablet (10 mg total) by mouth every 6 (six) hours as needed (Nausea or vomiting). (Patient not taking: Reported on 09/16/2020) 30 tablet 1  . simvastatin (ZOCOR) 40 MG tablet Take 40 mg by mouth at bedtime.     . traMADol (ULTRAM) 50 MG tablet Take 1 tablet (50 mg total) by mouth every 6 (six) hours as needed. 20 tablet 0  . Turmeric 500 MG TABS Take 500 mg by mouth daily.    . valsartan (DIOVAN) 320 MG tablet Take 320 mg by mouth daily.     No current facility-administered medications for this visit.    ALLERGIES:  No Known Allergies  PHYSICAL EXAM:  Performance status (ECOG): 0 - Asymptomatic  Vitals:   09/30/20 0806  BP: (!) 141/72  Pulse: 78  Resp: 18  Temp: (!) 97.2 F (36.2 C)  SpO2: 99%   Wt Readings from Last 3 Encounters:  09/30/20 176 lb 2.4 oz (79.9 kg)  09/16/20 175 lb 11.2 oz (79.7 kg)  09/16/20 175 lb 11.2 oz (79.7 kg)   Physical Exam Vitals reviewed.  Constitutional:      Appearance: Normal appearance. She is obese.  Cardiovascular:     Rate and Rhythm: Normal rate and regular rhythm.     Pulses: Normal pulses.     Heart sounds: Normal heart sounds.  Pulmonary:     Effort: Pulmonary effort is normal.     Breath sounds: Normal breath sounds.  Chest:     Comments: Port-a-Cath in L chest Neurological:     General: No focal deficit present.     Mental Status: She is alert and oriented to person, place, and time.  Psychiatric:        Mood and Affect: Mood normal.        Behavior: Behavior normal.     LABORATORY DATA:  I have reviewed the labs as listed.  CBC Latest Ref Rng & Units 09/30/2020 09/16/2020 08/18/2020  WBC 4.0 - 10.5 K/uL 1.9(L) 7.1 7.3  Hemoglobin 12.0 - 15.0 g/dL 11.6(L) 13.7 14.4  Hematocrit 36 - 46 % 35.3(L) 42.0 45.7  Platelets 150 - 400 K/uL 216 218 275   CMP Latest Ref Rng & Units 09/30/2020  09/16/2020 08/18/2020  Glucose 70 - 99 mg/dL 92 81 101(H)  BUN 8 -  23 mg/dL 12 21 23   Creatinine 0.44 - 1.00 mg/dL 0.85 0.94 0.99  Sodium 135 - 145 mmol/L 143 141 138  Potassium 3.5 - 5.1 mmol/L 3.2(L) 4.0 4.4  Chloride 98 - 111 mmol/L 109 107 107  CO2 22 - 32 mmol/L 26 24 25   Calcium 8.9 - 10.3 mg/dL 8.9 9.2 9.2  Total Protein 6.5 - 8.1 g/dL 5.8(L) 7.0 7.5  Total Bilirubin 0.3 - 1.2 mg/dL 0.6 0.9 0.6  Alkaline Phos 38 - 126 U/L 129(H) 65 73  AST 15 - 41 U/L 47(H) 26 25  ALT 0 - 44 U/L 67(H) 19 23    DIAGNOSTIC IMAGING:  I have independently reviewed the scans and discussed with the patient. MR Abdomen W Wo Contrast  Result Date: 09/12/2020 CLINICAL DATA:  74 year old female with history of cystic mass of the left kidney. Follow-up study. EXAM: MRI ABDOMEN WITHOUT AND WITH CONTRAST TECHNIQUE: Multiplanar multisequence MR imaging of the abdomen was performed both before and after the administration of intravenous contrast. CONTRAST:  36mL GADAVIST GADOBUTROL 1 MMOL/ML IV SOLN COMPARISON:  No prior abdominal MRI. CT the abdomen and pelvis 08/05/2020. FINDINGS: Lower chest: Unremarkable. Hepatobiliary: No cystic or solid hepatic lesions. No intra or extrahepatic biliary ductal dilatation. Status post cholecystectomy. Pancreas: No pancreatic mass. No pancreatic ductal dilatation. No pancreatic or peripancreatic fluid collections or inflammatory changes. Spleen:  Unremarkable. Adrenals/Urinary Tract: There are multiple renal lesions bilaterally, the majority of which are T1 hypointense, T2 hyperintense, and do not enhance, compatible with simple cysts. In addition, in the lateral aspect of interpolar region of the left kidney there is a complex lesion measuring 2.9 x 3.5 x 3.9 cm (axial image 7 of series 5 and coronal image 11 of series 4), which is T1 hypointense with the exception of some T1 hyperintensity along the inferior and medial margin, T2 hyperintense, has multiple internal septations which are  somewhat irregular, and diffuse septal enhancement on post gadolinium imaging. No hydroureteronephrosis. Right adrenal gland is normal in appearance. Signal void in the upper left retroperitoneum corresponding to densely calcified left adrenal gland. Stomach/Bowel: Visualized portions are unremarkable. Vascular/Lymphatic: Aortic atherosclerosis without evidence of aneurysm identified in the visualized abdominal vasculature. No lymphadenopathy noted in the abdomen. Other: No significant volume of ascites noted in the visualized portions of the peritoneal cavity. Musculoskeletal: No aggressive appearing osseous lesions are noted in the visualized portions of the skeleton. IMPRESSION: 1. 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 Urologic consultation is recommended. 2. Multiple other simple (Bosniak class I cysts) in the kidneys bilaterally. 3. Aortic atherosclerosis. Electronically Signed   By: Vinnie Langton M.D.   On: 09/12/2020 08:35   DG Chest Port 1 View  Result Date: 09/09/2020 CLINICAL DATA:  Left Port-A-Cath insertion EXAM: PORTABLE CHEST 1 VIEW COMPARISON:  None. FINDINGS: Left Port-A-Cath in place with the tip in the SVC. No pneumothorax. Heart and mediastinal contours are within normal limits. No focal opacities or effusions. No acute bony abnormality. IMPRESSION: Left Port-A-Cath in place without pneumothorax. No active disease. Electronically Signed   By: Rolm Baptise M.D.   On: 09/09/2020 10:54   DG C-Arm 1-60 Min-No Report  Result Date: 09/09/2020 Fluoroscopy was utilized by the requesting physician.  No radiographic interpretation.     ASSESSMENT:  1. Rectal adenocarcinoma: -History of rectal polyp with adenocarcinoma, status post transanal resection of the polyp in November 2013. -Colonoscopy on  July 10, 2020 shows 2.5 cm centrally depressed ulcerated lesion with heaped up circular margin, 8 cm from  anal verge. Distally, just above the anal verge, suture line consistent with history of prior transanal polypectomy. -Pathology of the rectal mass consistent with adenocarcinoma. Splenic flexure polypectomy was tubular adenoma. Sigmoid colon biopsy was benign. -CT CAP with contrast on August 05, 2020 shows no evidence of neoplastic disease within the chest, abdomen or pelvis. Complicated left midpole renal cyst versus complex cystic mass. No lung masses. -Pelvis MRI on 08/27/2020 showed T3BN0 tumor. Distance of tumor/node from mesorectal fascia was 1 cm. Distance from tumor to the internal anal sphincter was 4.2 cm. Tumor size is 2.2 x 2.2 cm. -Total neoadjuvant therapy with 4 months of FOLFOX followed by long course chemo RT with Xeloda followed by restaging pelvis MRI 6 to 8 weeks after completion of XRT followed by resection recommended. -Cycle 1 of FOLFOX on 09/16/2020  2. Social/family history: -She lives at home with her husband. She worked as a Counselling psychologist for 47 years. No history of smoking. -Maternal aunt had breast cancer.   PLAN:  1.Stage II (T3N0) rectal adenocarcinoma: -She tolerated first cycle very well.  Did not experience any major toxicities. -Oxaliplatin was given at 68 mg per metered squared. -CBC today showed white count 1.9 with ANC of 400.  Platelet count was normal. -AST and ALT were slightly elevated at 47 and 67 respectively. -We will hold her treatment today.  We will reevaluate her next week.  She will receive G-CSF today. -We will plan to add long-acting G-CSF on day 3 of her chemo. -We will consider further dose reduction if her liver enzymes continue to be elevated.  2. Left kidney lesion: -MRI of the abdomen showed a 3.5 x 3.9 x 2.9 cm Bosniak class III cyst.  Follow-up with urology.  3.  Hypokalemia:  -Potassium today is 3.2.  We will give her 40 mEq of potassium.   Orders placed this encounter:  No orders of the defined types were placed  in this encounter.    [name], MD [place] [phone]   I, Milinda Antis, am acting as a scribe for Dr. Sanda Linger.  I, [name] MD, have reviewed the above documentation for accuracy and completeness, and I agree with the above.

## 2020-09-30 NOTE — Patient Instructions (Signed)
[facility] at [facility] Discharge Instructions  Labs drawn from portacath today   Thank you for choosing [facility] at [facility] to provide your oncology and hematology care.  To afford each patient quality time with our provider, please arrive at least 15 minutes before your scheduled appointment time.   If you have a lab appointment with the Cancer Center please come in thru the Main Entrance and check in at the main information desk.  You need to re-schedule your appointment should you arrive 10 or more minutes late.  We strive to give you quality time with our providers, and arriving late affects you and other patients whose appointments are after yours.  Also, if you no show three or more times for appointments you may be dismissed from the clinic at the providers discretion.     Again, thank you for choosing [facility].  Our hope is that these requests will decrease the amount of time that you wait before being seen by our physicians.       _____________________________________________________________  Should you have questions after your visit to [facility], please contact our office at [phone] and follow the prompts.  Our office hours are 8:00 a.m. and 4:30 p.m. Monday - Friday.  Please note that voicemails left after 4:00 p.m. may not be returned until the following business day.  We are closed weekends and major holidays.  You do have access to a nurse 24-7, just call the main number to the clinic [phone] and do not press any options, hold on the line and a nurse will answer the phone.    For prescription refill requests, have your pharmacy contact our office and allow 72 hours.    Due to Covid, you will need to wear a mask upon entering the hospital. If you do not have a mask, a mask will be given to you at the Main Entrance upon arrival. For doctor visits, patients may have 1 support person age 18  or older with them. For treatment visits, patients can not have anyone with them due to social distancing guidelines and our immunocompromised population.     

## 2020-09-30 NOTE — Progress Notes (Signed)
Orders received to give Zarxio 480 mcg x 1 due to neutropenia ANC 0.4  Chemo delayed 1 week and adding pegfilgrastim to pump DC day.   T.O. Dr Rhys Martini, PharmD

## 2020-09-30 NOTE — Patient Instructions (Signed)
[place] at [facility] Discharge Instructions  You were seen today by Dr. Delton Coombes. He went over your recent results. You did not receive your treatment today since your white blood cell count is too low; you received an injection to boost your white blood cell count. Dr. Delton Coombes will see you back in 1 week for labs and follow up.   Thank you for choosing [place] at [facility] to provide your oncology and hematology care.  To afford each patient quality time with our provider, please arrive at least 15 minutes before your scheduled appointment time.   If you have a lab appointment with the [facility] please come in thru the Main Entrance and check in at the main information desk  You need to re-schedule your appointment should you arrive 10 or more minutes late.  We strive to give you quality time with our providers, and arriving late affects you and other patients whose appointments are after yours.  Also, if you no show three or more times for appointments you may be dismissed from the clinic at the providers discretion.     Again, thank you for choosing [facility].  Our hope is that these requests will decrease the amount of time that you wait before being seen by our physicians.       _____________________________________________________________  Should you have questions after your visit to [facility], please contact our office at [phone] between the hours of 8:00 a.m. and 4:30 p.m.  Voicemails left after 4:00 p.m. will not be returned until the following business day.  For prescription refill requests, have your pharmacy contact our office and allow 72 hours.    Cancer Center Support Programs:   > Cancer Support Group  2nd Tuesday of the month 1pm-2pm, Journey Room

## 2020-09-30 NOTE — Patient Instructions (Signed)
[facility] at [facility]  Discharge Instructions:   _______________________________________________________________  Thank you for choosing [facility] at [facility] to provide your oncology and hematology care.  To afford each patient quality time with our providers, please arrive at least 15 minutes before your scheduled appointment.  You need to re-schedule your appointment if you arrive 10 or more minutes late.  We strive to give you quality time with our providers, and arriving late affects you and other patients whose appointments are after yours.  Also, if you no show three or more times for appointments you may be dismissed from the clinic.  Again, thank you for choosing [facility]. Our hope is that these requests will allow you access to exceptional care and in a timely manner. _______________________________________________________________  If you have questions after your visit, please contact our office at [phone] between the hours of 8:30 a.m. and 5:00 p.m. Voicemails left after 4:30 p.m. will not be returned until the following business day. _______________________________________________________________  For prescription refill requests, have your pharmacy contact our office. _______________________________________________________________  Recommendations made by the consultant and any test results will be sent to your referring physician. _______________________________________________________________ 

## 2020-09-30 NOTE — Progress Notes (Signed)
Labs reviewed with MD today. No treatment today per MD. Will give Zarxio per order, potassium per MD and reschedule patient for next week per MD.   Injection given per orders. Patient tolerated it well without problems. Vitals stable and discharged home from clinic ambulatory in stable condition. Follow up as scheduled.

## 2020-10-02 ENCOUNTER — Encounter (HOSPITAL_COMMUNITY): Payer: Medicare Other

## 2020-10-07 ENCOUNTER — Encounter (HOSPITAL_COMMUNITY): Payer: Self-pay | Admitting: Hematology

## 2020-10-07 ENCOUNTER — Inpatient Hospital Stay (HOSPITAL_COMMUNITY): Payer: Medicare Other

## 2020-10-07 ENCOUNTER — Inpatient Hospital Stay (HOSPITAL_BASED_OUTPATIENT_CLINIC_OR_DEPARTMENT_OTHER): Payer: Medicare Other | Admitting: Hematology

## 2020-10-07 ENCOUNTER — Other Ambulatory Visit: Payer: Self-pay

## 2020-10-07 VITALS — BP 141/71 | HR 81 | Temp 97.0°F | Resp 18 | Wt 176.0 lb

## 2020-10-07 VITALS — BP 150/79 | HR 70 | Temp 97.3°F | Resp 18

## 2020-10-07 DIAGNOSIS — C20 Malignant neoplasm of rectum: Secondary | ICD-10-CM | POA: Diagnosis not present

## 2020-10-07 DIAGNOSIS — Z95828 Presence of other vascular implants and grafts: Secondary | ICD-10-CM

## 2020-10-07 DIAGNOSIS — Z5111 Encounter for antineoplastic chemotherapy: Secondary | ICD-10-CM | POA: Diagnosis not present

## 2020-10-07 LAB — CBC WITH DIFFERENTIAL/PLATELET
Band Neutrophils: 1 %
Basophils Absolute: 0 10*3/uL (ref 0.0–0.1)
Basophils Relative: 0 %
Blasts: 2 %
Eosinophils Absolute: 0.1 10*3/uL (ref 0.0–0.5)
Eosinophils Relative: 1 %
HCT: 38.4 % (ref 36.0–46.0)
Hemoglobin: 12.4 g/dL (ref 12.0–15.0)
Lymphocytes Relative: 21 %
Lymphs Abs: 2.3 10*3/uL (ref 0.7–4.0)
MCH: 30.7 pg (ref 26.0–34.0)
MCHC: 32.3 g/dL (ref 30.0–36.0)
MCV: 95 fL (ref 80.0–100.0)
Metamyelocytes Relative: 5 %
Monocytes Absolute: 1.7 10*3/uL — ABNORMAL HIGH (ref 0.1–1.0)
Monocytes Relative: 16 %
Myelocytes: 6 %
Neutro Abs: 4.7 10*3/uL (ref 1.7–7.7)
Neutrophils Relative %: 42 %
Platelets: 360 10*3/uL (ref 150–400)
Promyelocytes Relative: 6 %
RBC: 4.04 MIL/uL (ref 3.87–5.11)
RDW: 16 % — ABNORMAL HIGH (ref 11.5–15.5)
WBC: 10.9 10*3/uL — ABNORMAL HIGH (ref 4.0–10.5)
nRBC: 0.2 % (ref 0.0–0.2)

## 2020-10-07 LAB — COMPREHENSIVE METABOLIC PANEL
ALT: 40 U/L (ref 0–44)
AST: 36 U/L (ref 15–41)
Albumin: 3.7 g/dL (ref 3.5–5.0)
Alkaline Phosphatase: 96 U/L (ref 38–126)
Anion gap: 10 (ref 5–15)
BUN: 17 mg/dL (ref 8–23)
CO2: 27 mmol/L (ref 22–32)
Calcium: 9.2 mg/dL (ref 8.9–10.3)
Chloride: 104 mmol/L (ref 98–111)
Creatinine, Ser: 0.91 mg/dL (ref 0.44–1.00)
GFR, Estimated: >60 mL/min (ref >60)
Glucose, Bld: 95 mg/dL (ref 70–99)
Potassium: 3.5 mmol/L (ref 3.5–5.1)
Sodium: 141 mmol/L (ref 135–145)
Total Bilirubin: 0.5 mg/dL (ref 0.3–1.2)
Total Protein: 6.8 g/dL (ref 6.5–8.1)

## 2020-10-07 MED ORDER — DEXTROSE 5 % IV SOLN: Freq: Once | INTRAVENOUS | Status: AC

## 2020-10-07 MED ORDER — FLUOROURACIL CHEMO INJECTION 2.5 GM/50ML
400.0000 mg/m2 | Freq: Once | INTRAVENOUS | Status: AC
  Administered 2020-10-07: 750 mg via INTRAVENOUS
  Filled 2020-10-07: qty 15

## 2020-10-07 MED ORDER — SODIUM CHLORIDE 0.9 % IV SOLN
10.0000 mg | Freq: Once | INTRAVENOUS | Status: AC
  Administered 2020-10-07: 10 mg via INTRAVENOUS
  Filled 2020-10-07: qty 10

## 2020-10-07 MED ORDER — OXALIPLATIN CHEMO INJECTION 100 MG/20ML
68.0000 mg/m2 | Freq: Once | INTRAVENOUS | Status: AC
  Administered 2020-10-07: 125 mg via INTRAVENOUS
  Filled 2020-10-07: qty 20

## 2020-10-07 MED ORDER — PALONOSETRON HCL INJECTION 0.25 MG/5ML
INTRAVENOUS | Status: AC
  Filled 2020-10-07: qty 5

## 2020-10-07 MED ORDER — LEUCOVORIN CALCIUM INJECTION 350 MG
380.0000 mg/m2 | Freq: Once | INTRAVENOUS | Status: AC
  Administered 2020-10-07: 700 mg via INTRAVENOUS
  Filled 2020-10-07: qty 35

## 2020-10-07 MED ORDER — SODIUM CHLORIDE 0.9 % IV SOLN
2400.0000 mg/m2 | INTRAVENOUS | Status: DC
  Administered 2020-10-07: 4400 mg via INTRAVENOUS
  Filled 2020-10-07: qty 88

## 2020-10-07 MED ORDER — PALONOSETRON HCL INJECTION 0.25 MG/5ML
0.2500 mg | Freq: Once | INTRAVENOUS | Status: AC
  Administered 2020-10-07: 0.25 mg via INTRAVENOUS

## 2020-10-07 NOTE — Progress Notes (Signed)
Patient was assessed by Dr. Katragadda and labs have been reviewed.  Patient is okay to proceed with treatment today. Primary RN and pharmacy aware.   

## 2020-10-07 NOTE — Patient Instructions (Signed)
[facility] Discharge Instructions for Patients Receiving Chemotherapy  Today you received the following chemotherapy agents   To help prevent nausea and vomiting after your treatment, we encourage you to take your nausea medication   If you develop nausea and vomiting that is not controlled by your nausea medication, call the clinic.   BELOW ARE SYMPTOMS THAT SHOULD BE REPORTED IMMEDIATELY:  *FEVER GREATER THAN 100.5 F  *CHILLS WITH OR WITHOUT FEVER  NAUSEA AND VOMITING THAT IS NOT CONTROLLED WITH YOUR NAUSEA MEDICATION  *UNUSUAL SHORTNESS OF BREATH  *UNUSUAL BRUISING OR BLEEDING  TENDERNESS IN MOUTH AND THROAT WITH OR WITHOUT PRESENCE OF ULCERS  *URINARY PROBLEMS  *BOWEL PROBLEMS  UNUSUAL RASH Items with * indicate a potential emergency and should be followed up as soon as possible.  Feel free to call the clinic should you have any questions or concerns. The clinic phone number is [phone].  Please show the CHEMO ALERT CARD at check-in to the Emergency Department and triage nurse.   

## 2020-10-07 NOTE — Patient Instructions (Signed)
[facility] at [facility] Discharge Instructions  Labs drawn from portacath today   Thank you for choosing [facility] at [facility] to provide your oncology and hematology care.  To afford each patient quality time with our provider, please arrive at least 15 minutes before your scheduled appointment time.   If you have a lab appointment with the Cancer Center please come in thru the Main Entrance and check in at the main information desk.  You need to re-schedule your appointment should you arrive 10 or more minutes late.  We strive to give you quality time with our providers, and arriving late affects you and other patients whose appointments are after yours.  Also, if you no show three or more times for appointments you may be dismissed from the clinic at the providers discretion.     Again, thank you for choosing [facility].  Our hope is that these requests will decrease the amount of time that you wait before being seen by our physicians.       _____________________________________________________________  Should you have questions after your visit to [facility], please contact our office at [phone] and follow the prompts.  Our office hours are 8:00 a.m. and 4:30 p.m. Monday - Friday.  Please note that voicemails left after 4:00 p.m. may not be returned until the following business day.  We are closed weekends and major holidays.  You do have access to a nurse 24-7, just call the main number to the clinic [phone] and do not press any options, hold on the line and a nurse will answer the phone.    For prescription refill requests, have your pharmacy contact our office and allow 72 hours.    Due to Covid, you will need to wear a mask upon entering the hospital. If you do not have a mask, a mask will be given to you at the Main Entrance upon arrival. For doctor visits, patients may have 1 support person age 18  or older with them. For treatment visits, patients can not have anyone with them due to social distancing guidelines and our immunocompromised population.     

## 2020-10-07 NOTE — Patient Instructions (Signed)
[facility] at [facility] °Discharge Instructions ° °You were seen today by Dr. Katragadda. He went over your recent results. You received your treatment today. Dr. Katragadda will see you back in 2 weeks for labs and follow up. ° ° °Thank you for choosing [facility] at [facility] to provide your oncology and hematology care.  To afford each patient quality time with our provider, please arrive at least 15 minutes before your scheduled appointment time.  ° °If you have a lab appointment with the Cancer Center please come in thru the Main Entrance and check in at the main information desk ° °You need to re-schedule your appointment should you arrive 10 or more minutes late.  We strive to give you quality time with our providers, and arriving late affects you and other patients whose appointments are after yours.  Also, if you no show three or more times for appointments you may be dismissed from the clinic at the providers discretion.     °Again, thank you for choosing [facility].  Our hope is that these requests will decrease the amount of time that you wait before being seen by our physicians.       °_____________________________________________________________ ° °Should you have questions after your visit to [facility], please contact our office at [phone] between the hours of 8:00 a.m. and 4:30 p.m.  Voicemails left after 4:00 p.m. will not be returned until the following business day.  For prescription refill requests, have your pharmacy contact our office and allow 72 hours.   ° °Cancer Center Support Programs:  ° °> Cancer Support Group  °2nd Tuesday of the month 1pm-2pm, Journey Room  ° ° °

## 2020-10-07 NOTE — Progress Notes (Signed)
Patient presents today for treatment and follow up visit with Dr. Delton Coombes. Labs pending. Blood pressure on arrival 161/82. Labs within parameters for treatment. Patient denies pain today. Patient has complaints of tingling in her fingertips bilateral which is a new symptom since her last visit. Patient states her hair started to fall out in clumps and she was told it would not fall out per patient's words.   Message received to proceed with treatment from [place] RN/ Dr. Delton Coombes.   Treatment given today per MD orders. Tolerated infusion without adverse affects. Vital signs stable. No complaints at this time.RUN noted on 5FU pump and verified with patient. Discharged from clinic ambulatory in stable condition. Alert and oriented x 3. F/U with [facility] as scheduled.

## 2020-10-07 NOTE — Progress Notes (Signed)
Brenda Mclean, Brenda Mclean   CLINIC:  Medical Oncology/Hematology  PCP:  Roderic Scarce, MD [address] [name] / [place] [address] [phone]   REASON FOR VISIT:  Follow-up for rectal cancer  PRIOR THERAPY: Transanal resection of polyp in 10/2012  NGS Results: Not done  CURRENT THERAPY: FOLFOX and Aloxi  BRIEF ONCOLOGIC HISTORY:  Oncology History  Rectal cancer ([place])  08/14/2020 Initial Diagnosis   Rectal cancer ([place])   09/02/2020 Cancer Staging   Staging form: Colon and Rectum, AJCC 8th Edition - Clinical stage from 09/02/2020: Stage IIA (cT3, cN0, cM0) - Signed by [name], MD on 09/02/2020   09/16/2020 -  Chemotherapy   The patient had palonosetron (ALOXI) injection 0.25 mg, 0.25 mg, Intravenous,  Once, 1 of 8 cycles Administration: 0.25 mg (09/16/2020) pegfilgrastim-cbqv (UDENYCA) injection 6 mg, 6 mg, Subcutaneous, Once, 0 of 7 cycles leucovorin 700 mg in dextrose 5 % 250 mL infusion, 380 mg/m2 = 736 mg, Intravenous,  Once, 1 of 8 cycles Administration: 700 mg (09/16/2020) oxaliplatin (ELOXATIN) 125 mg in dextrose 5 % 500 mL chemo infusion, 68 mg/m2 = 125 mg (80 % of original dose 85 mg/m2), Intravenous,  Once, 1 of 8 cycles Dose modification: 68 mg/m2 (80 % of original dose 85 mg/m2, Cycle 1, Reason: Provider Judgment) Administration: 125 mg (09/16/2020) fluorouracil (ADRUCIL) chemo injection 750 mg, 400 mg/m2 = 750 mg, Intravenous,  Once, 1 of 8 cycles Administration: 750 mg (09/16/2020) fluorouracil (ADRUCIL) 4,400 mg in sodium chloride 0.9 % 62 mL chemo infusion, 2,400 mg/m2 = 4,400 mg, Intravenous, 1 Day/Dose, 1 of 8 cycles Administration: 4,400 mg (09/16/2020)  for chemotherapy treatment.      CANCER STAGING: Cancer Staging Rectal cancer [facility]) Staging form: Colon and Rectum, AJCC 8th Edition - Clinical stage from 09/02/2020: Stage IIA (cT3, cN0, cM0) - Signed by [name], MD on  09/02/2020   INTERVAL HISTORY:  Brenda Mclean, a 74 y.o. female, returns for routine follow-up and consideration for next cycle of chemotherapy. Brenda Mclean was last seen on 09/30/2020.  Due for cycle #2 of FOLFOX today.   Overall, she tells me she has been feeling pretty well. Her hair is starting to thin out, though denies it coming out by the handful. She denies having numbness or tingling. She tolerated Zarxio well. Her appetite is great. She denies abdominal pain, diarrhea, or constipation.  She wants to change her radiation from [place] to [place].  Overall, she feels ready for next cycle of chemo today.    REVIEW OF SYSTEMS:  Review of Systems  Constitutional: Positive for fatigue (75%). Negative for appetite change.  Respiratory: Positive for cough (yellow sputum).   Gastrointestinal: Negative for abdominal pain, constipation and diarrhea.  Neurological: Negative for numbness.  All other systems reviewed and are negative.   PAST MEDICAL/SURGICAL HISTORY:  Past Medical History:  Diagnosis Date  . Arthritis   . COVID-19 03/2019  . GERD (gastroesophageal reflux disease)   . HLD (hyperlipidemia)   . HTN (hypertension)   . Port-A-Cath in place 09/09/2020  . 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 Sigmoid diverticulitis 9163   Uncomplicated   Past Surgical History:  Procedure Laterality Date  . BIOPSY  07/10/2020   Procedure: BIOPSY;  Surgeon: [name], MD;  Location: [facility];  Service: Endoscopy;;  . CHOLECYSTECTOMY  1998  . COLON SURGERY  10/30/2012   Dr. Audrie Lia; transanal resection of 2 cm rectal polyp;  pathology revealed superficially invasive moderately differentiated adenocarcinoma arising in adenomatous polyp of distal rectum.  . COLONOSCOPY  10/24/2013   Dr. [place] Carbo; with propofol; small granuloma and sutures distal rectum, small polyp mid rectum removed, sigmoid diverticulosis, otherwise normal exam.  No pathology received.  Recommended  colonoscopy in 5 years.  . COLONOSCOPY  09/08/2012   Dr. [place] Carbo; with propofol; multi lobulated friable 3 cm mass distal rectum s/p multiple biopsies, scattered sigmoid diverticulosis, otherwise normal exam.  Pathology with adenomatous polyp.  . COLONOSCOPY WITH PROPOFOL N/A 07/10/2020   Procedure: COLONOSCOPY WITH PROPOFOL;  Surgeon: [name], MD;  Location: [facility];  Service: Endoscopy;  Laterality: N/A;  10:00am  . POLYPECTOMY  07/10/2020   Procedure: POLYPECTOMY;  Surgeon: [name], MD;  Location: [facility];  Service: Endoscopy;;  . PORTACATH PLACEMENT Left 09/09/2020   Procedure: INSERTION PORT-A-CATH;  Surgeon: Aviva Signs, MD;  Location: [facility] ORS;  Service: General;  Laterality: Left;  . REPLACEMENT TOTAL HIP W/  RESURFACING IMPLANTS Bilateral   . 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 REPLACEMENT TOTAL KNEE Right 12/03/2014  . TOTAL SHOULDER ARTHROPLASTY  04/26/2018    SOCIAL HISTORY:  Social History   Socioeconomic History  . Marital status: Married    Spouse name: Not on file  . Number of children: 2  . Years of education: Not on file  . Highest education level: Not on file  Occupational History    Employer: retired  Tobacco Use  . Smoking status: Never Smoker  . Smokeless tobacco: Never Used  Vaping Use  . Vaping Use: Never used  Substance and Sexual Activity  . Alcohol use: Never  . Drug use: Never  . Sexual activity: Not Currently  Other Topics Concern  . Not on file  Social History Narrative  . Not on file   Social Determinants of Health   Financial Resource Strain: Low Risk   . Difficulty of Paying Living Expenses: Not hard at all  Food Insecurity: No Food Insecurity  . Worried About Charity fundraiser in the Last Year: Never true  . Ran Out of Food in the Last Year: Never true  Transportation Needs: No Transportation Needs  . Lack of Transportation (Medical): No  . Lack of Transportation (Non-Medical): No  Physical  Activity: Inactive  . Days of Exercise per Week: 0 days  . Minutes of Exercise per Session: 0 min  Stress: No Stress Concern Present  . Feeling of Stress : Only a little  Social Connections: Moderately Integrated  . Frequency of Communication with Friends and Family: More than three times a week  . Frequency of Social Gatherings with Friends and Family: More than three times a week  . Attends Religious Services: More than 4 times per year  . Active Member of Clubs or Organizations: No  . Attends Archivist Meetings: Never  . Marital Status: Married  Human resources officer Violence: Not At Risk  . Fear of Current or Ex-Partner: No  . Emotionally Abused: No  . Physically Abused: No  . Sexually Abused: No    FAMILY HISTORY:  Family History  Problem Relation Age of Onset  . Arthritis Mother   . High blood pressure Mother   . Heart disease Father   . Breast cancer Maternal Aunt   . Stroke Maternal Grandmother   . Cancer Paternal Grandmother   . Diabetes Brother   . Colon cancer Neg Hx     CURRENT MEDICATIONS:  Current  Outpatient Medications  Medication Sig Dispense Refill  . celecoxib (CELEBREX) 200 MG capsule Take 200 mg by mouth daily.    Marland Kitchen dexamethasone 10 mg in sodium chloride 0.9 % 50 mL Inject 10 mg into the vein every 14 (fourteen) days. Prior to chemotherapy administration    . fluorouracil CALGB 33295 in sodium chloride 0.9 % 150 mL Inject 2,400 mg/m2 into the vein over 48 hr.    . FLUOROURACIL IV Inject 400 mg/m2 into the vein every 14 (fourteen) days.    Marland Kitchen LEUCOVORIN CALCIUM IV Inject 400 mg/m2 into the vein every 14 (fourteen) days.    Marland Kitchen lidocaine-prilocaine (EMLA) cream Apply a small amount to port a cath site and cover with plastic wrap 1 hour prior to chemotherapy appointments (Patient not taking: Reported on 09/16/2020) 30 g 3  . Multiple Vitamin (MULTIVITAMIN WITH MINERALS) TABS tablet Take 1 tablet by mouth daily.    Marland Kitchen omega-3 acid ethyl esters (LOVAZA) 1 g  capsule Take 2 g by mouth daily.    Marland Kitchen omeprazole (PRILOSEC) 20 MG capsule Take 20 mg by mouth daily.    . OXALIPLATIN IV Inject 85 mg/m2 into the vein every 14 (fourteen) days.    . palonosetron (ALOXI) 0.25 MG/5ML injection Inject 0.25 mg into the vein every 14 (fourteen) days. Prior to chemotherapy administration    . prochlorperazine (COMPAZINE) 10 MG tablet Take 1 tablet (10 mg total) by mouth every 6 (six) hours as needed (Nausea or vomiting). (Patient not taking: Reported on 09/16/2020) 30 tablet 1  . simvastatin (ZOCOR) 40 MG tablet Take 40 mg by mouth at bedtime.     . traMADol (ULTRAM) 50 MG tablet Take 1 tablet (50 mg total) by mouth every 6 (six) hours as needed. 20 tablet 0  . Turmeric 500 MG TABS Take 500 mg by mouth daily.    . valsartan (DIOVAN) 320 MG tablet Take 320 mg by mouth daily.     No current facility-administered medications for this visit.    ALLERGIES:  No Known Allergies  PHYSICAL EXAM:  Performance status (ECOG): 0 - Asymptomatic  Vitals:   10/07/20 0809 10/07/20 0817  BP: (!) 161/82 (!) 141/71  Pulse: 81   Resp: 18   Temp: (!) 97 F (36.1 C)   SpO2: 100%    Wt Readings from Last 3 Encounters:  10/07/20 176 lb (79.8 kg)  09/30/20 176 lb 2.4 oz (79.9 kg)  09/16/20 175 lb 11.2 oz (79.7 kg)   Physical Exam Vitals reviewed.  Constitutional:      Appearance: Normal appearance. She is obese.  Cardiovascular:     Rate and Rhythm: Normal rate and regular rhythm.     Pulses: Normal pulses.     Heart sounds: Normal heart sounds.  Pulmonary:     Effort: Pulmonary effort is normal.     Breath sounds: Normal breath sounds.  Chest:     Comments: Port-a-Cath in L chest Musculoskeletal:     Right lower leg: No edema.     Left lower leg: No edema.  Neurological:     General: No focal deficit present.     Mental Status: She is alert and oriented to person, place, and time.  Psychiatric:        Mood and Affect: Mood normal.        Behavior: Behavior  normal.     LABORATORY DATA:  I have reviewed the labs as listed.  CBC Latest Ref Rng & Units 10/07/2020 09/30/2020 09/16/2020  WBC 4.0 - 10.5 K/uL 10.9(H) 1.9(L) 7.1  Hemoglobin 12.0 - 15.0 g/dL 12.4 11.6(L) 13.7  Hematocrit 36 - 46 % 38.4 35.3(L) 42.0  Platelets 150 - 400 K/uL 360 216 218   CMP Latest Ref Rng & Units 10/07/2020 09/30/2020 09/16/2020  Glucose 70 - 99 mg/dL 95 92 81  BUN 8 - 23 mg/dL 17 12 21   Creatinine 0.44 - 1.00 mg/dL 0.91 0.85 0.94  Sodium 135 - 145 mmol/L 141 143 141  Potassium 3.5 - 5.1 mmol/L 3.5 3.2(L) 4.0  Chloride 98 - 111 mmol/L 104 109 107  CO2 22 - 32 mmol/L 27 26 24   Calcium 8.9 - 10.3 mg/dL 9.2 8.9 9.2  Total Protein 6.5 - 8.1 g/dL 6.8 5.8(L) 7.0  Total Bilirubin 0.3 - 1.2 mg/dL 0.5 0.6 0.9  Alkaline Phos 38 - 126 U/L 96 129(H) 65  AST 15 - 41 U/L 36 47(H) 26  ALT 0 - 44 U/L 40 67(H) 19    DIAGNOSTIC IMAGING:  I have independently reviewed the scans and discussed with the patient. MR Abdomen W Wo Contrast  Result Date: 09/12/2020 CLINICAL DATA:  74 year old female with history of cystic mass of the left kidney. Follow-up study. EXAM: MRI ABDOMEN WITHOUT AND WITH CONTRAST TECHNIQUE: Multiplanar multisequence MR imaging of the abdomen was performed both before and after the administration of intravenous contrast. CONTRAST:  90mL GADAVIST GADOBUTROL 1 MMOL/ML IV SOLN COMPARISON:  No prior abdominal MRI. CT the abdomen and pelvis 08/05/2020. FINDINGS: Lower chest: Unremarkable. Hepatobiliary: No cystic or solid hepatic lesions. No intra or extrahepatic biliary ductal dilatation. Status post cholecystectomy. Pancreas: No pancreatic mass. No pancreatic ductal dilatation. No pancreatic or peripancreatic fluid collections or inflammatory changes. Spleen:  Unremarkable. Adrenals/Urinary Tract: There are multiple renal lesions bilaterally, the majority of which are T1 hypointense, T2 hyperintense, and do not enhance, compatible with simple cysts. In addition, in the  lateral aspect of interpolar region of the left kidney there is a complex lesion measuring 2.9 x 3.5 x 3.9 cm (axial image 7 of series 5 and coronal image 11 of series 4), which is T1 hypointense with the exception of some T1 hyperintensity along the inferior and medial margin, T2 hyperintense, has multiple internal septations which are somewhat irregular, and diffuse septal enhancement on post gadolinium imaging. No hydroureteronephrosis. Right adrenal gland is normal in appearance. Signal void in the upper left retroperitoneum corresponding to densely calcified left adrenal gland. Stomach/Bowel: Visualized portions are unremarkable. Vascular/Lymphatic: Aortic atherosclerosis without evidence of aneurysm identified in the visualized abdominal vasculature. No lymphadenopathy noted in the abdomen. Other: No significant volume of ascites noted in the visualized portions of the peritoneal cavity. Musculoskeletal: No aggressive appearing osseous lesions are noted in the visualized portions of the skeleton. IMPRESSION: 1. 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 Urologic consultation is recommended. 2. Multiple other simple (Bosniak class I cysts) in the kidneys bilaterally. 3. Aortic atherosclerosis. Electronically Signed   By: Vinnie Langton M.D.   On: 09/12/2020 08:35   DG Chest Port 1 View  Result Date: 09/09/2020 CLINICAL DATA:  Left Port-A-Cath insertion EXAM: PORTABLE CHEST 1 VIEW COMPARISON:  None. FINDINGS: Left Port-A-Cath in place with the tip in the SVC. No pneumothorax. Heart and mediastinal contours are within normal limits. No focal opacities or effusions. No acute bony abnormality. IMPRESSION: Left Port-A-Cath in place without pneumothorax. No active disease. Electronically Signed  By: Rolm Baptise M.D.   On: 09/09/2020 10:54   DG C-Arm 1-60 Min-No Report  Result Date: 09/09/2020 Fluoroscopy was utilized by  the requesting physician.  No radiographic interpretation.     ASSESSMENT:  1. Rectal adenocarcinoma: -History of rectal polyp with adenocarcinoma, status post transanal resection of the polyp in November 2013. -Colonoscopy on July 10, 2020 shows 2.5 cm centrally depressed ulcerated lesion with heaped up circular margin, 8 cm from anal verge. Distally, just above the anal verge, suture line consistent with history of prior transanal polypectomy. -Pathology of the rectal mass consistent with adenocarcinoma. Splenic flexure polypectomy was tubular adenoma. Sigmoid colon biopsy was benign. -CT CAP with contrast on August 05, 2020 shows no evidence of neoplastic disease within the chest, abdomen or pelvis. Complicated left midpole renal cyst versus complex cystic mass. No lung masses. -Pelvis MRI on 08/27/2020 showed T3BN0 tumor. Distance of tumor/node from mesorectal fascia was 1 cm. Distance from tumor to the internal anal sphincter was 4.2 cm. Tumor size is 2.2 x 2.2 cm. -Total neoadjuvant therapy with 4 months of FOLFOX followed by long course chemo RT with Xeloda followed by restaging pelvis MRI 6 to 8 weeks after completion of XRT followed by resection recommended. -Cycle 1 of FOLFOX on 09/16/2020  2. Social/family history: -She lives at home with her husband. She worked as a Counselling psychologist for 47 years. No history of smoking. -Maternal aunt had breast cancer.   PLAN:  1.Stage II (T3N0) rectal adenocarcinoma: -She tolerated cycle 1 very well. -We reviewed labs from today which showed white count 10.9.  Platelet count is normal.  LFTs are normal. -She will proceed with cycle 2 today.  Oxaliplatin dose reduced by 20%. -She has mild on and off numbness in the fingertips which we will keep a close eye on. -We will add a long-acting G-CSF on day 3 when she comes back for pump discontinuation. -She would like to switch her radiation therapy to [place] because of  proximity. -Reevaluate in 2 weeks.  2. Left kidney lesion: -MRI abdomen showed Bosniak class III cyst.  Follow-up with urology recommended.  3.  Hypokalemia:  -Potassium today is 3.5.  No supplements necessary.   Orders placed this encounter:  No orders of the defined types were placed in this encounter.    [name], MD [place] [phone]   I, Milinda Antis, am acting as a scribe for Dr. Sanda Linger.  I, [name] MD, have reviewed the above documentation for accuracy and completeness, and I agree with the above.

## 2020-10-09 ENCOUNTER — Encounter (HOSPITAL_COMMUNITY): Payer: Self-pay

## 2020-10-09 ENCOUNTER — Other Ambulatory Visit: Payer: Self-pay

## 2020-10-09 ENCOUNTER — Inpatient Hospital Stay (HOSPITAL_COMMUNITY): Payer: Medicare Other

## 2020-10-09 VITALS — BP 136/62 | HR 68 | Temp 96.9°F | Resp 18

## 2020-10-09 DIAGNOSIS — C20 Malignant neoplasm of rectum: Secondary | ICD-10-CM

## 2020-10-09 DIAGNOSIS — Z95828 Presence of other vascular implants and grafts: Secondary | ICD-10-CM

## 2020-10-09 DIAGNOSIS — Z5111 Encounter for antineoplastic chemotherapy: Secondary | ICD-10-CM | POA: Diagnosis not present

## 2020-10-09 MED ORDER — PEGFILGRASTIM-CBQV 6 MG/0.6ML ~~LOC~~ SOSY
6.0000 mg | PREFILLED_SYRINGE | Freq: Once | SUBCUTANEOUS | Status: AC
  Administered 2020-10-09: 6 mg via SUBCUTANEOUS

## 2020-10-09 MED ORDER — PEGFILGRASTIM-CBQV 6 MG/0.6ML ~~LOC~~ SOSY
PREFILLED_SYRINGE | SUBCUTANEOUS | Status: AC
  Filled 2020-10-09: qty 0.6

## 2020-10-09 MED ORDER — HEPARIN SOD (PORK) LOCK FLUSH 100 UNIT/ML IV SOLN
500.0000 [IU] | Freq: Once | INTRAVENOUS | Status: AC | PRN
  Administered 2020-10-09: 500 [IU]

## 2020-10-09 MED ORDER — SODIUM CHLORIDE 0.9% FLUSH
10.0000 mL | INTRAVENOUS | Status: DC | PRN
  Administered 2020-10-09: 10 mL

## 2020-10-09 NOTE — Progress Notes (Signed)
Aaniyah Strohm Guynes presents to have home infusion pump d/c'd and for port-a-cath deaccess with flush.  Portacath located left chest wall accessed with  H 20 needle.  Good blood return present. Portacath flushed with NS and 500U/23ml Heparin, and needle removed intact.  Procedure tolerated well and without incident.  Michol Emory Mounger presents today for injection per the provider's orders.  Udenyca administration without incident; injection site WNL; see MAR for injection details.  Patient tolerated procedure well and without incident.  No questions or complaints noted at this time. Discharged ambulatory in stable condition.

## 2020-10-21 ENCOUNTER — Inpatient Hospital Stay (HOSPITAL_COMMUNITY): Payer: Medicare Other

## 2020-10-21 ENCOUNTER — Other Ambulatory Visit: Payer: Self-pay

## 2020-10-21 ENCOUNTER — Inpatient Hospital Stay (HOSPITAL_BASED_OUTPATIENT_CLINIC_OR_DEPARTMENT_OTHER): Payer: Medicare Other | Admitting: Hematology

## 2020-10-21 VITALS — BP 158/78 | HR 79 | Temp 97.3°F | Resp 18 | Wt 176.4 lb

## 2020-10-21 VITALS — BP 144/82 | HR 71 | Temp 97.2°F | Resp 18

## 2020-10-21 DIAGNOSIS — Z95828 Presence of other vascular implants and grafts: Secondary | ICD-10-CM

## 2020-10-21 DIAGNOSIS — Z5111 Encounter for antineoplastic chemotherapy: Secondary | ICD-10-CM | POA: Diagnosis not present

## 2020-10-21 DIAGNOSIS — C20 Malignant neoplasm of rectum: Secondary | ICD-10-CM | POA: Diagnosis not present

## 2020-10-21 LAB — CBC WITH DIFFERENTIAL/PLATELET
Band Neutrophils: 6 %
Basophils Absolute: 0 10*3/uL (ref 0.0–0.1)
Basophils Relative: 0 %
Blasts: 2 %
Eosinophils Absolute: 0.3 10*3/uL (ref 0.0–0.5)
Eosinophils Relative: 1 %
HCT: 34.9 % — ABNORMAL LOW (ref 36.0–46.0)
Hemoglobin: 11.6 g/dL — ABNORMAL LOW (ref 12.0–15.0)
Lymphocytes Relative: 15 %
Lymphs Abs: 4.5 10*3/uL — ABNORMAL HIGH (ref 0.7–4.0)
MCH: 31.2 pg (ref 26.0–34.0)
MCHC: 33.2 g/dL (ref 30.0–36.0)
MCV: 93.8 fL (ref 80.0–100.0)
Metamyelocytes Relative: 8 %
Monocytes Absolute: 0.9 10*3/uL (ref 0.1–1.0)
Monocytes Relative: 3 %
Myelocytes: 6 %
Neutro Abs: 18.1 10*3/uL — ABNORMAL HIGH (ref 1.7–7.7)
Neutrophils Relative %: 54 %
Platelets: 162 10*3/uL (ref 150–400)
Promyelocytes Relative: 5 %
RBC: 3.72 MIL/uL — ABNORMAL LOW (ref 3.87–5.11)
RDW: 16.4 % — ABNORMAL HIGH (ref 11.5–15.5)
WBC: 30.1 10*3/uL — ABNORMAL HIGH (ref 4.0–10.5)
nRBC: 0.3 % — ABNORMAL HIGH (ref 0.0–0.2)

## 2020-10-21 LAB — COMPREHENSIVE METABOLIC PANEL
ALT: 27 U/L (ref 0–44)
AST: 38 U/L (ref 15–41)
Albumin: 3.8 g/dL (ref 3.5–5.0)
Alkaline Phosphatase: 152 U/L — ABNORMAL HIGH (ref 38–126)
Anion gap: 9 (ref 5–15)
BUN: 18 mg/dL (ref 8–23)
CO2: 27 mmol/L (ref 22–32)
Calcium: 9.7 mg/dL (ref 8.9–10.3)
Chloride: 107 mmol/L (ref 98–111)
Creatinine, Ser: 0.98 mg/dL (ref 0.44–1.00)
GFR, Estimated: 60 mL/min — ABNORMAL LOW (ref >60)
Glucose, Bld: 92 mg/dL (ref 70–99)
Potassium: 3.4 mmol/L — ABNORMAL LOW (ref 3.5–5.1)
Sodium: 143 mmol/L (ref 135–145)
Total Bilirubin: 0.6 mg/dL (ref 0.3–1.2)
Total Protein: 6.8 g/dL (ref 6.5–8.1)

## 2020-10-21 MED ORDER — DEXTROSE 5 % IV SOLN: Freq: Once | INTRAVENOUS | Status: AC

## 2020-10-21 MED ORDER — OXALIPLATIN CHEMO INJECTION 100 MG/20ML
68.0000 mg/m2 | Freq: Once | INTRAVENOUS | Status: AC
  Administered 2020-10-21: 125 mg via INTRAVENOUS
  Filled 2020-10-21: qty 20

## 2020-10-21 MED ORDER — LEUCOVORIN CALCIUM INJECTION 350 MG
380.0000 mg/m2 | Freq: Once | INTRAVENOUS | Status: AC
  Administered 2020-10-21: 700 mg via INTRAVENOUS
  Filled 2020-10-21: qty 35

## 2020-10-21 MED ORDER — SODIUM CHLORIDE 0.9% FLUSH
10.0000 mL | INTRAVENOUS | Status: DC | PRN
  Administered 2020-10-21: 10 mL

## 2020-10-21 MED ORDER — SODIUM CHLORIDE 0.9 % IV SOLN
10.0000 mg | Freq: Once | INTRAVENOUS | Status: AC
  Administered 2020-10-21: 10 mg via INTRAVENOUS
  Filled 2020-10-21: qty 10

## 2020-10-21 MED ORDER — FLUOROURACIL CHEMO INJECTION 2.5 GM/50ML
400.0000 mg/m2 | Freq: Once | INTRAVENOUS | Status: AC
  Administered 2020-10-21: 750 mg via INTRAVENOUS
  Filled 2020-10-21: qty 15

## 2020-10-21 MED ORDER — PALONOSETRON HCL INJECTION 0.25 MG/5ML
0.2500 mg | Freq: Once | INTRAVENOUS | Status: AC
  Administered 2020-10-21: 0.25 mg via INTRAVENOUS
  Filled 2020-10-21: qty 5

## 2020-10-21 MED ORDER — SODIUM CHLORIDE 0.9 % IV SOLN
2400.0000 mg/m2 | INTRAVENOUS | Status: DC
  Administered 2020-10-21: 4400 mg via INTRAVENOUS
  Filled 2020-10-21: qty 88

## 2020-10-21 NOTE — Progress Notes (Signed)
Patient was assessed by Dr. Delton Coombes and labs have been reviewed.  Alkaline Phosphatase 152, potassium 3.4, patient is okay to proceed with treatment today. Primary RN and pharmacy aware.

## 2020-10-21 NOTE — Progress Notes (Signed)
Brenda Mclean presents today for D1C3 FOLFOX. Pt denies any new changes or symptoms since last treatment. Lab results, including WBC 30.1 and Alk Phos 152, and vitals have been reviewed and are stable and within parameters for treatment. Patient has been assessed by Dr. Delton Coombes who has approved proceeding with treatment today as planned.  Infusions tolerated without incident or complaint. VSS upon completion of treatment. Port flushed and 5FU infusing via home infusion pump with RUN visible on screen, see MAR and IV flowsheet for details. Discharged in satisfactory condition with follow up instructions.

## 2020-10-21 NOTE — Patient Instructions (Signed)
[facility] °Discharge Instructions ° °You were seen today by Dr. Katragadda. He went over your recent results. You received your treatment today. Dr. Katragadda will see you back in 2 weeks for labs and follow up. ° ° °Thank you for choosing [facility] at [facility] to provide your oncology and hematology care.  To afford each patient quality time with our provider, please arrive at least 15 minutes before your scheduled appointment time.  ° °If you have a lab appointment with the Cancer Center please come in thru the Main Entrance and check in at the main information desk ° °You need to re-schedule your appointment should you arrive 10 or more minutes late.  We strive to give you quality time with our providers, and arriving late affects you and other patients whose appointments are after yours.  Also, if you no show three or more times for appointments you may be dismissed from the clinic at the providers discretion.     °Again, thank you for choosing [facility].  Our hope is that these requests will decrease the amount of time that you wait before being seen by our physicians.       °_____________________________________________________________ ° °Should you have questions after your visit to  Cancer Center, please contact our office at [phone] between the hours of 8:00 a.m. and 4:30 p.m.  Voicemails left after 4:00 p.m. will not be returned until the following business day.  For prescription refill requests, have your pharmacy contact our office and allow 72 hours.   ° °Cancer Center Support Programs:  ° °> Cancer Support Group  °2nd Tuesday of the month 1pm-2pm, Journey Room  ° ° °

## 2020-10-21 NOTE — Progress Notes (Signed)
Brenda Mclean, [place] [address]   CLINIC:  Medical Oncology/Hematology  PCP:  Roderic Scarce, MD [address] [name] / [place] [address] [phone]   REASON FOR VISIT:  Follow-up for rectal cancer  PRIOR THERAPY: Transanal resection of polyp in 10/2012  NGS Results: Not done  CURRENT THERAPY: FOLFOX and Aloxi  BRIEF ONCOLOGIC HISTORY:  Oncology History  Rectal cancer ([place])  08/14/2020 Initial Diagnosis   Rectal cancer ([place])   09/02/2020 Cancer Staging   Staging form: Colon and Rectum, AJCC 8th Edition - Clinical stage from 09/02/2020: Stage IIA (cT3, cN0, cM0) - Signed by [name], MD on 09/02/2020   09/16/2020 -  Chemotherapy   The patient had palonosetron (ALOXI) injection 0.25 mg, 0.25 mg, Intravenous,  Once, 2 of 8 cycles Administration: 0.25 mg (09/16/2020), 0.25 mg (10/07/2020) pegfilgrastim-cbqv (UDENYCA) injection 6 mg, 6 mg, Subcutaneous, Once, 1 of 7 cycles Administration: 6 mg (10/09/2020) leucovorin 700 mg in dextrose 5 % 250 mL infusion, 380 mg/m2 = 736 mg, Intravenous,  Once, 2 of 8 cycles Administration: 700 mg (09/16/2020), 700 mg (10/07/2020) oxaliplatin (ELOXATIN) 125 mg in dextrose 5 % 500 mL chemo infusion, 68 mg/m2 = 125 mg (80 % of original dose 85 mg/m2), Intravenous,  Once, 2 of 8 cycles Dose modification: 68 mg/m2 (80 % of original dose 85 mg/m2, Cycle 1, Reason: Provider Judgment), 68 mg/m2 (80 % of original dose 85 mg/m2, Cycle 2, Reason: Provider Judgment) Administration: 125 mg (09/16/2020), 125 mg (10/07/2020) fluorouracil (ADRUCIL) chemo injection 750 mg, 400 mg/m2 = 750 mg, Intravenous,  Once, 2 of 8 cycles Administration: 750 mg (09/16/2020), 750 mg (10/07/2020) fluorouracil (ADRUCIL) 4,400 mg in sodium chloride 0.9 % 62 mL chemo infusion, 2,400 mg/m2 = 4,400 mg, Intravenous, 1 Day/Dose, 2 of 8 cycles Administration: 4,400 mg (09/16/2020), 4,400 mg (10/07/2020)  for chemotherapy treatment.       CANCER STAGING: Cancer Staging Rectal cancer [facility]) Staging form: Colon and Rectum, AJCC 8th Edition - Clinical stage from 09/02/2020: Stage IIA (cT3, cN0, cM0) - Signed by [name], MD on 09/02/2020   INTERVAL HISTORY:  Brenda Mclean, a 74 y.o. female, returns for routine follow-up and consideration for next cycle of chemotherapy. Joelys was last seen on 10/07/2020.  Due for cycle #3 of FOLFOX and Aloxi today.   Overall, she tells me she has been feeling pretty well. She tolerated the previous treatment well and denies N/V/D, numbness or tingling, cold sensitivity or any new pains. She denies her hair thinning out. Her appetite is good and she is eating.  Overall, she feels ready for next cycle of chemo today.    REVIEW OF SYSTEMS:  Review of Systems  Constitutional: Positive for appetite change (75%) and fatigue (75%).  HENT:   Positive for sore throat (occasional).   Gastrointestinal: Negative for diarrhea, nausea and vomiting.  Neurological: Negative for numbness.  All other systems reviewed and are negative.   PAST MEDICAL/SURGICAL HISTORY:  Past Medical History:  Diagnosis Date  . Arthritis   . COVID-19 03/2019  . GERD (gastroesophageal reflux disease)   . HLD (hyperlipidemia)   . HTN (hypertension)   . Port-A-Cath in place 09/09/2020  . 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 Sigmoid diverticulitis 9379   Uncomplicated   Past Surgical History:  Procedure Laterality Date  . BIOPSY  07/10/2020   Procedure: BIOPSY;  Surgeon: [name], MD;  [facility]: [facility];  Service: Endoscopy;;  .  CHOLECYSTECTOMY  1998  . COLON SURGERY  10/30/2012   Dr. Audrie Lia; transanal resection of 2 cm rectal polyp; pathology revealed superficially invasive moderately differentiated adenocarcinoma arising in adenomatous polyp of distal rectum.  . COLONOSCOPY  10/24/2013   Dr. West Carbo; with propofol; small granuloma and sutures distal rectum, small polyp mid rectum  removed, sigmoid diverticulosis, otherwise normal exam.  No pathology received.  Recommended colonoscopy in 5 years.  . COLONOSCOPY  09/08/2012   Dr. West Carbo; with propofol; multi lobulated friable 3 cm mass distal rectum s/p multiple biopsies, scattered sigmoid diverticulosis, otherwise normal exam.  Pathology with adenomatous polyp.  . COLONOSCOPY WITH PROPOFOL N/A 07/10/2020   Procedure: COLONOSCOPY WITH PROPOFOL;  Surgeon: [name], MD;  Location: [facility];  Service: Endoscopy;  Laterality: N/A;  10:00am  . POLYPECTOMY  07/10/2020   Procedure: POLYPECTOMY;  Surgeon: [name], MD;  Location: [facility];  Service: Endoscopy;;  . PORTACATH PLACEMENT Left 09/09/2020   Procedure: INSERTION PORT-A-CATH;  Surgeon: Aviva Signs, MD;  Location: [facility] ORS;  Service: General;  Laterality: Left;  . REPLACEMENT TOTAL HIP W/  RESURFACING IMPLANTS Bilateral   . 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 REPLACEMENT TOTAL KNEE Right 12/03/2014  . TOTAL SHOULDER ARTHROPLASTY  04/26/2018    SOCIAL HISTORY:  Social History   Socioeconomic History  . Marital status: Married    Spouse name: Not on file  . Number of children: 2  . Years of education: Not on file  . Highest education level: Not on file  Occupational History    Employer: retired  Tobacco Use  . Smoking status: Never Smoker  . Smokeless tobacco: Never Used  Vaping Use  . Vaping Use: Never used  Substance and Sexual Activity  . Alcohol use: Never  . Drug use: Never  . Sexual activity: Not Currently  Other Topics Concern  . Not on file  Social History Narrative  . Not on file   Social Determinants of Health   Financial Resource Strain: Low Risk   . Difficulty of Paying Living Expenses: Not hard at all  Food Insecurity: No Food Insecurity  . Worried About Charity fundraiser in the Last Year: Never true  . Ran Out of Food in the Last Year: Never true  Transportation Needs: No Transportation Needs  . Lack of  Transportation (Medical): No  . Lack of Transportation (Non-Medical): No  Physical Activity: Inactive  . Days of Exercise per Week: 0 days  . Minutes of Exercise per Session: 0 min  Stress: No Stress Concern Present  . Feeling of Stress : Only a little  Social Connections: Moderately Integrated  . Frequency of Communication with Friends and Family: More than three times a week  . Frequency of Social Gatherings with Friends and Family: More than three times a week  . Attends Religious Services: More than 4 times per year  . Active Member of Clubs or Organizations: No  . Attends Archivist Meetings: Never  . Marital Status: Married  Human resources officer Violence: Not At Risk  . Fear of Current or Ex-Partner: No  . Emotionally Abused: No  . Physically Abused: No  . Sexually Abused: No    FAMILY HISTORY:  Family History  Problem Relation Age of Onset  . Arthritis Mother   . High blood pressure Mother   . Heart disease Father   . Breast cancer Maternal Aunt   . Stroke Maternal Grandmother   . Cancer Paternal Grandmother   .  Diabetes Brother   . Colon cancer Neg Hx     CURRENT MEDICATIONS:  Current Outpatient Medications  Medication Sig Dispense Refill  . celecoxib (CELEBREX) 200 MG capsule Take 200 mg by mouth daily.    Marland Kitchen dexamethasone 10 mg in sodium chloride 0.9 % 50 mL Inject 10 mg into the vein every 14 (fourteen) days. Prior to chemotherapy administration    . fluorouracil CALGB 38101 in sodium chloride 0.9 % 150 mL Inject 2,400 mg/m2 into the vein over 48 hr.    . FLUOROURACIL IV Inject 400 mg/m2 into the vein every 14 (fourteen) days.    Marland Kitchen LEUCOVORIN CALCIUM IV Inject 400 mg/m2 into the vein every 14 (fourteen) days.    . Multiple Vitamin (MULTIVITAMIN WITH MINERALS) TABS tablet Take 1 tablet by mouth daily.    Marland Kitchen omega-3 acid ethyl esters (LOVAZA) 1 g capsule Take 2 g by mouth daily.    Marland Kitchen omeprazole (PRILOSEC) 20 MG capsule Take 20 mg by mouth daily.    .  OXALIPLATIN IV Inject 85 mg/m2 into the vein every 14 (fourteen) days.    . palonosetron (ALOXI) 0.25 MG/5ML injection Inject 0.25 mg into the vein every 14 (fourteen) days. Prior to chemotherapy administration    . simvastatin (ZOCOR) 40 MG tablet Take 40 mg by mouth at bedtime.     . traMADol (ULTRAM) 50 MG tablet Take 1 tablet (50 mg total) by mouth every 6 (six) hours as needed. 20 tablet 0  . Turmeric 500 MG TABS Take 500 mg by mouth daily.    . valsartan (DIOVAN) 320 MG tablet Take 320 mg by mouth daily.    Marland Kitchen lidocaine-prilocaine (EMLA) cream Apply a small amount to port a cath site and cover with plastic wrap 1 hour prior to chemotherapy appointments (Patient not taking: Reported on 10/21/2020) 30 g 3  . prochlorperazine (COMPAZINE) 10 MG tablet Take 1 tablet (10 mg total) by mouth every 6 (six) hours as needed (Nausea or vomiting). (Patient not taking: Reported on 10/21/2020) 30 tablet 1   No current facility-administered medications for this visit.    ALLERGIES:  No Known Allergies  PHYSICAL EXAM:  Performance status (ECOG): 0 - Asymptomatic  Vitals:   10/21/20 0817  BP: (!) 158/78  Pulse: 79  Resp: 18  Temp: (!) 97.3 F (36.3 C)  SpO2: 97%   Wt Readings from Last 3 Encounters:  10/21/20 176 lb 6.4 oz (80 kg)  10/07/20 176 lb (79.8 kg)  09/30/20 176 lb 2.4 oz (79.9 kg)   Physical Exam Vitals reviewed.  Constitutional:      Appearance: Normal appearance. She is obese.  Cardiovascular:     Rate and Rhythm: Normal rate and regular rhythm.     Pulses: Normal pulses.     Heart sounds: Normal heart sounds.  Pulmonary:     Effort: Pulmonary effort is normal.     Breath sounds: Normal breath sounds.  Chest:     Comments: Port-a-Cath in L chest Neurological:     General: No focal deficit present.     Mental Status: She is alert and oriented to person, place, and time.  Psychiatric:        Mood and Affect: Mood normal.        Behavior: Behavior normal.      LABORATORY DATA:  I have reviewed the labs as listed.  CBC Latest Ref Rng & Units 10/21/2020 10/07/2020 09/30/2020  WBC 4.0 - 10.5 K/uL 30.1(H) 10.9(H) 1.9(L)  Hemoglobin 12.0 -  15.0 g/dL 11.6(L) 12.4 11.6(L)  Hematocrit 36 - 46 % 34.9(L) 38.4 35.3(L)  Platelets 150 - 400 K/uL 162 360 216   CMP Latest Ref Rng & Units 10/21/2020 10/07/2020 09/30/2020  Glucose 70 - 99 mg/dL 92 95 92  BUN 8 - 23 mg/dL 18 17 12   Creatinine 0.44 - 1.00 mg/dL 0.98 0.91 0.85  Sodium 135 - 145 mmol/L 143 141 143  Potassium 3.5 - 5.1 mmol/L 3.4(L) 3.5 3.2(L)  Chloride 98 - 111 mmol/L 107 104 109  CO2 22 - 32 mmol/L 27 27 26   Calcium 8.9 - 10.3 mg/dL 9.7 9.2 8.9  Total Protein 6.5 - 8.1 g/dL 6.8 6.8 5.8(L)  Total Bilirubin 0.3 - 1.2 mg/dL 0.6 0.5 0.6  Alkaline Phos 38 - 126 U/L 152(H) 96 129(H)  AST 15 - 41 U/L 38 36 47(H)  ALT 0 - 44 U/L 27 40 67(H)    DIAGNOSTIC IMAGING:  I have independently reviewed the scans and discussed with the patient. No results found.   ASSESSMENT:  1. Rectal adenocarcinoma: -History of rectal polyp with adenocarcinoma, status post transanal resection of the polyp in November 2013. -Colonoscopy on July 10, 2020 shows 2.5 cm centrally depressed ulcerated lesion with heaped up circular margin, 8 cm from anal verge. Distally, just above the anal verge, suture line consistent with history of prior transanal polypectomy. -Pathology of the rectal mass consistent with adenocarcinoma. Splenic flexure polypectomy was tubular adenoma. Sigmoid colon biopsy was benign. -CT CAP with contrast on August 05, 2020 shows no evidence of neoplastic disease within the chest, abdomen or pelvis. Complicated left midpole renal cyst versus complex cystic mass. No lung masses. -Pelvis MRI on 08/27/2020 showed T3BN0 tumor. Distance of tumor/node from mesorectal fascia was 1 cm. Distance from tumor to the internal anal sphincter was 4.2 cm. Tumor size is 2.2 x 2.2 cm. -Total neoadjuvant therapy  with 4 months of FOLFOX followed by long course chemo RT with Xeloda followed by restaging pelvis MRI 6 to 8 weeks after completion of XRT followed by resection recommended. -Cycle 1 of FOLFOX on 09/16/2020  2. Social/family history: -She lives at home with her husband. She worked as a Counselling psychologist for 47 years. No history of smoking. -Maternal aunt had breast cancer.   PLAN:  1.Stage II (T3N0) rectal adenocarcinoma: -She has tolerated cycle two without any major side effects. -She experienced some tiredness after G-CSF injection. -Reviewed her LFTs which showed elevated alkaline phosphatase of 152 from ninety-six. -CBC showed elevated white count of thirty secondary to G-CSF.  Platelet count was normal. -Proceed with cycle three today.  Oxaliplatin dose reduction by 30%. -RTC 2 weeks for follow-up with labs and treatment.  2. 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  Follow-up with urology.  3. Hypokalemia:  -Potassium today is 3.4.  If it continues to get worse, will consider oral supplementation.   Orders placed this encounter:  No orders of the defined types were placed in this encounter.    [name], MD [place] [phone]   I, Milinda Antis, am acting as a scribe for Dr. Sanda Linger.  I, [name] MD, have reviewed the above documentation for accuracy and completeness, and I agree with the above.

## 2020-10-21 NOTE — Patient Instructions (Signed)
[facility] Discharge Instructions for Patients Receiving Chemotherapy   Beginning January 23rd 2017 lab work for the [facility] will be done in the  Main lab at [facility] on 1st floor. If you have a lab appointment with the [facility] please come in thru the  Main Entrance and check in at the main information desk   Today you received the following chemotherapy agents FOLFOX  To help prevent nausea and vomiting after your treatment, we encourage you to take your nausea medication If you develop nausea and vomiting, or diarrhea that is not controlled by your medication, call the clinic.  The clinic phone number is [phone]. Office hours are Monday-Friday 8:30am-5:00pm.  BELOW ARE SYMPTOMS THAT SHOULD BE REPORTED IMMEDIATELY:  *FEVER GREATER THAN 101.0 F  *CHILLS WITH OR WITHOUT FEVER  NAUSEA AND VOMITING THAT IS NOT CONTROLLED WITH YOUR NAUSEA MEDICATION  *UNUSUAL SHORTNESS OF BREATH  *UNUSUAL BRUISING OR BLEEDING  TENDERNESS IN MOUTH AND THROAT WITH OR WITHOUT PRESENCE OF ULCERS  *URINARY PROBLEMS  *BOWEL PROBLEMS  UNUSUAL RASH Items with * indicate a potential emergency and should be followed up as soon as possible. If you have an emergency after office hours please contact your primary care physician or go to the nearest emergency department.  Please call the clinic during office hours if you have any questions or concerns.   You may also contact the Patient Navigator at [phone] should you have any questions or need assistance in obtaining follow up care.      Resources For Cancer Patients and their Caregivers ? American Cancer Society: Can assist with transportation, wigs, general needs, runs Look Good Feel Better.        8152117463 ? Cancer Care: Provides financial assistance, online support groups, medication/co-pay assistance.  1-800-813-HOPE 479-516-7104) ? [place] Assists [place] cancer  patients and their families through emotional , educational and financial support.  425-239-1612 ? [place] DSS Where to apply for food stamps, Medicaid and utility assistance. (640)622-9913 ? [facility]: Transportation to medical appointments. (567) 169-4251 ? Social Security Administration: May apply for disability if have a Stage IV cancer. (717)540-5823 (934)832-8275 ? [employer], Disability and Transit Services: Assists with nutrition, care and transit needs. (484)180-9733

## 2020-10-23 ENCOUNTER — Other Ambulatory Visit: Payer: Self-pay

## 2020-10-23 ENCOUNTER — Inpatient Hospital Stay (HOSPITAL_COMMUNITY): Payer: Medicare Other

## 2020-10-23 ENCOUNTER — Encounter (HOSPITAL_COMMUNITY): Payer: Self-pay

## 2020-10-23 VITALS — BP 121/55 | HR 77 | Temp 97.3°F | Resp 18

## 2020-10-23 DIAGNOSIS — Z95828 Presence of other vascular implants and grafts: Secondary | ICD-10-CM

## 2020-10-23 DIAGNOSIS — Z5111 Encounter for antineoplastic chemotherapy: Secondary | ICD-10-CM | POA: Diagnosis not present

## 2020-10-23 DIAGNOSIS — C20 Malignant neoplasm of rectum: Secondary | ICD-10-CM

## 2020-10-23 MED ORDER — HEPARIN SOD (PORK) LOCK FLUSH 100 UNIT/ML IV SOLN
500.0000 [IU] | Freq: Once | INTRAVENOUS | Status: AC | PRN
  Administered 2020-10-23: 500 [IU]

## 2020-10-23 MED ORDER — PEGFILGRASTIM-CBQV 6 MG/0.6ML ~~LOC~~ SOSY
6.0000 mg | PREFILLED_SYRINGE | Freq: Once | SUBCUTANEOUS | Status: AC
  Administered 2020-10-23: 6 mg via SUBCUTANEOUS
  Filled 2020-10-23: qty 0.6

## 2020-10-23 MED ORDER — SODIUM CHLORIDE 0.9% FLUSH
10.0000 mL | INTRAVENOUS | Status: DC | PRN
  Administered 2020-10-23: 10 mL

## 2020-10-23 NOTE — Progress Notes (Signed)
Brenda Mclean tolerated 5FU pump well without complaints or incident. 5FU pump discontinued with portacath flushed easily per protocol then de-accessed. VSS Pt discharged self ambulatory in satisfactory condition

## 2020-10-23 NOTE — Patient Instructions (Addendum)
[place] at [facility] Discharge Instructions  5FU pump discontinued with portacath flushed per protocol today with Udenyca injection given as well. Follow-up as scheduled   Thank you for choosing [place] at [facility] to provide your oncology and hematology care.  To afford each patient quality time with our provider, please arrive at least 15 minutes before your scheduled appointment time.   If you have a lab appointment with the [facility] please come in thru the Main Entrance and check in at the main information desk.  You need to re-schedule your appointment should you arrive 10 or more minutes late.  We strive to give you quality time with our providers, and arriving late affects you and other patients whose appointments are after yours.  Also, if you no show three or more times for appointments you may be dismissed from the clinic at the providers discretion.     Again, thank you for choosing [facility].  Our hope is that these requests will decrease the amount of time that you wait before being seen by our physicians.       _____________________________________________________________  Should you have questions after your visit to [facility], please contact our office at [phone] and follow the prompts.  Our office hours are 8:00 a.m. and 4:30 p.m. Monday - Friday.  Please note that voicemails left after 4:00 p.m. may not be returned until the following business day.  We are closed weekends and major holidays.  You do have access to a nurse 24-7, just call the main number to the clinic [phone] and do not press any options, hold on the line and a nurse will answer the phone.    For prescription refill requests, have your pharmacy contact our office and allow 72 hours.    Due to Covid, you will need to wear a mask upon entering the hospital. If you do not have a mask, a mask will be given to you at  the Main Entrance upon arrival. For doctor visits, patients may have 1 support person age 51 or older with them. For treatment visits, patients can not have anyone with them due to social distancing guidelines and our immunocompromised population.

## 2020-10-24 ENCOUNTER — Ambulatory Visit (INDEPENDENT_AMBULATORY_CARE_PROVIDER_SITE_OTHER): Payer: Medicare Other | Admitting: Urology

## 2020-10-24 ENCOUNTER — Encounter: Payer: Self-pay | Admitting: Urology

## 2020-10-24 VITALS — BP 133/78 | HR 80 | Temp 98.6°F | Ht 60.0 in | Wt 176.4 lb

## 2020-10-24 DIAGNOSIS — N289 Disorder of kidney and ureter, unspecified: Secondary | ICD-10-CM

## 2020-10-24 DIAGNOSIS — N2889 Other specified disorders of kidney and ureter: Secondary | ICD-10-CM | POA: Diagnosis not present

## 2020-10-24 LAB — MICROSCOPIC EXAMINATION: RBC, Urine: NONE SEEN /hpf (ref 0–2)

## 2020-10-24 LAB — URINALYSIS, ROUTINE W REFLEX MICROSCOPIC
Bilirubin, UA: NEGATIVE
Glucose, UA: NEGATIVE
Ketones, UA: NEGATIVE
Leukocytes,UA: NEGATIVE
Nitrite, UA: NEGATIVE
RBC, UA: NEGATIVE
Specific Gravity, UA: 1.02 (ref 1.005–1.030)
Urobilinogen, Ur: 0.2 mg/dL (ref 0.2–1.0)
pH, UA: 6 (ref 5.0–7.5)

## 2020-10-24 NOTE — Progress Notes (Signed)

## 2020-10-24 NOTE — Patient Instructions (Signed)
Renal Mass  A renal mass is a growth in the kidney. A renal mass may be found while performing an MRI, CT scan, or ultrasound for other problems of the abdomen. Certain types of cancers, infections, or injuries can cause a renal mass. A renal mass that is cancerous (malignant) may grow or spread quickly. Others are harmless (benign). What are common types of renal masses? Renal masses include:  Tumors. These may be cancerous (malignant) or noncancerous (benign). ? The most common type of kidney cancer is renal cell carcinoma. ? The most common benign tumors of the kidney include renal adenomas, oncocytomas, and angiomyolipoma (AML).  Cysts. These are fluid-filled sacs that form on or in the kidney. ? It is not always known what causes a cyst to develop in or on the kidney. ? Most kidney cysts do not cause symptoms and do not need to be treated. What type of testing might I need? Your health care provider may recommend that you have tests to diagnose the cause of your renal mass. The following tests may be done if a renal mass is found:  Physical exam.  Blood tests.  Urine tests.  Imaging tests, such as ultrasound, CT scan, or MRI.  Biopsy. This is a small sample that is removed from the renal mass and tested in a lab. The exact tests and how often they are done will depend on:  The size and appearance of the renal mass.  Risk factors or medical conditions that increase your risk for problems.  Any symptoms associated with the renal mass, or concerns that you have about it. Tests and physical exams may be done once, or they may be done regularly for a period of time. Tests and exams that are done regularly will help monitor whether the mass is growing and beginning to cause problems. What are common treatments for renal masses? Treatment is not always needed for this condition. Your health care provider may recommend careful monitoring (watchful waiting) and regular tests and exams.  Treatment will depend on the cause of the mass. Follow these instructions at home: What you need to do at home will depend on the cause of the mass. Follow the instructions that your health care provider gives to you. In general:  Take over-the-counter and prescription medicines only as told by your health care provider.  If you are prescribed an antibiotic medicine, take it as told by your health care provider. Do not stop taking the antibiotic even if you start to feel better.  Follow any restrictions that are given to you by your health care provider.  Keep all follow-up visits as told by your health care provider. This is important. ? You may need to see your health care provider once or twice a year to have CT scans and ultrasounds done. These tests will show if your renal mass has changed or grown bigger. Contact a health care provider if you:  Have pain in the side or back (flank pain).  Have a fever.  Feel full soon after eating.  Have pain or swelling in the abdomen.  Lose weight. Get help right away if:  Your pain gets worse.  There is blood in your urine.  You cannot urinate.  You have chest pain.  You have trouble breathing. Summary  A renal mass is a growth in the kidney. It may be cancerous (malignant) and grow or spread quickly, or it may be harmless (benign).  Renal masses may be found while performing   an MRI, CT scan, or ultrasound for other problems of the abdomen.  Your health care provider may recommend that you have tests to diagnose the cause of your renal mass. This may include a physical exam, blood tests, urine tests, imaging, or a biopsy.  Treatment is not always needed for this condition. Careful monitoring (watchful waiting) may be recommended. This information is not intended to replace advice given to you by your health care provider. Make sure you discuss any questions you have with your health care provider. Document Revised: 01/19/2018  Document Reviewed: 01/19/2018 Elsevier Patient Education  2020 [employer]vier Inc[employer]  

## 2020-10-24 NOTE — Progress Notes (Signed)
10/24/2020 9:02 AM   Brenda Mclean [birth year] [id number]  Referring provider: Roderic Scarce, MD [address] [place] [address]  Left renal mass  HPI: Brenda Mclean is a 74yo here for evaluation of a left renal mass. 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 NO flank pain. No LUTS. No hematuria. She is on chemotherapy for colon cancer.    PMH: Past Medical History:  Diagnosis Date  . Arthritis   . COVID-19 03/2019  . GERD (gastroesophageal reflux disease)   . HLD (hyperlipidemia)   . HTN (hypertension)   . Port-A-Cath in place 09/09/2020  . 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 Sigmoid diverticulitis 5537   Uncomplicated    Surgical History: Past Surgical History:  Procedure Laterality Date  . BIOPSY  07/10/2020   Procedure: BIOPSY;  Surgeon: [name], MD;  Location: [facility];  Service: Endoscopy;;  . CHOLECYSTECTOMY  1998  . COLON SURGERY  10/30/2012   Dr. Audrie Lia; transanal resection of 2 cm rectal polyp; pathology revealed superficially invasive moderately differentiated adenocarcinoma arising in adenomatous polyp of distal rectum.  . COLONOSCOPY  10/24/2013   Dr. West Carbo; with propofol; small granuloma and sutures distal rectum, small polyp mid rectum removed, sigmoid diverticulosis, otherwise normal exam.  No pathology received.  Recommended colonoscopy in 5 years.  . COLONOSCOPY  09/08/2012   Dr. West Carbo; with propofol; multi lobulated friable 3 cm mass distal rectum s/p multiple biopsies, scattered sigmoid diverticulosis, otherwise normal exam.  Pathology with adenomatous polyp.  . COLONOSCOPY WITH PROPOFOL N/A 07/10/2020   Procedure: COLONOSCOPY WITH PROPOFOL;  Surgeon: [name], MD;  Location: [facility];  Service: Endoscopy;  Laterality: N/A;  10:00am  . POLYPECTOMY  07/10/2020   Procedure: POLYPECTOMY;  Surgeon: [name], MD;  Location: [facility];  Service: Endoscopy;;    . PORTACATH PLACEMENT Left 09/09/2020   Procedure: INSERTION PORT-A-CATH;  Surgeon: Aviva Signs, MD;  Location: [facility];  Service: General;  Laterality: Left;  . REPLACEMENT TOTAL HIP W/  RESURFACING IMPLANTS Bilateral   . 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 REPLACEMENT TOTAL KNEE Right 12/03/2014  . TOTAL SHOULDER ARTHROPLASTY  04/26/2018    Home Medications:  Allergies as of 10/24/2020   No Known Allergies     Medication List       Accurate as of October 24, 2020  9:02 AM. If you have any questions, ask your nurse or doctor.        Aloxi 0.25 MG/5ML injection Generic drug: palonosetron Inject 0.25 mg into the vein every 14 (fourteen) days. Prior to chemotherapy administration   celecoxib 200 MG capsule Commonly known as: CELEBREX Take 200 mg by mouth daily.   dexamethasone 10 mg in sodium chloride 0.9 % 50 mL Inject 10 mg into the vein every 14 (fourteen) days. Prior to chemotherapy administration   fluorouracil CALGB 48270 in sodium chloride 0.9 % 150 mL Inject 2,400 mg/m2 into the vein over 48 hr.   FLUOROURACIL IV Inject 400 mg/m2 into the vein every 14 (fourteen) days.   LEUCOVORIN CALCIUM IV Inject 400 mg/m2 into the vein every 14 (fourteen) days.   lidocaine-prilocaine cream Commonly known as: EMLA Apply a small amount to port a cath site and cover with plastic wrap 1 hour prior to chemotherapy appointments   multivitamin with minerals Tabs tablet Take 1 tablet by mouth daily.   omega-3 acid ethyl esters 1  g capsule Commonly known as: LOVAZA Take 2 g by mouth daily.   omeprazole 20 MG capsule Commonly known as: PRILOSEC Take 20 mg by mouth daily.   OXALIPLATIN IV Inject 85 mg/m2 into the vein every 14 (fourteen) days.   prochlorperazine 10 MG tablet Commonly known as: COMPAZINE Take 1 tablet (10 mg total) by mouth every 6 (six) hours as needed (Nausea or vomiting).   simvastatin 40 MG tablet Commonly known as: ZOCOR Take 40 mg by mouth  at bedtime.   traMADol 50 MG tablet Commonly known as: Ultram Take 1 tablet (50 mg total) by mouth every 6 (six) hours as needed.   Turmeric 500 MG Tabs Take 500 mg by mouth daily.   valsartan 320 MG tablet Commonly known as: DIOVAN Take 320 mg by mouth daily.       Allergies: No Known Allergies  Family History: Family History  Problem Relation Age of Onset  . Arthritis Mother   . High blood pressure Mother   . Heart disease Father   . Breast cancer Maternal Aunt   . Stroke Maternal Grandmother   . Cancer Paternal Grandmother   . Diabetes Brother   . Colon cancer Neg Hx     Social History:  reports that she has never smoked. She has never used smokeless tobacco. She reports that she does not drink alcohol and does not use drugs.  ROS: All other review of systems were reviewed and are negative except what is noted above in HPI  Physical Exam: BP 133/78   Pulse 80   Temp 98.6 F (37 C)   Ht 5' (1.524 m)   Wt 176 lb 6.4 oz (80 kg)   BMI 34.45 kg/m   Constitutional:  Alert and oriented, No acute distress. HEENT: [place] AT, moist mucus membranes.  Trachea midline, no masses. Cardiovascular: No clubbing, cyanosis, or edema. Respiratory: Normal respiratory effort, no increased work of breathing. GI: Abdomen is soft, nontender, nondistended, no abdominal masses GU: No CVA tenderness.  Lymph: No cervical or inguinal lymphadenopathy. Skin: No rashes, bruises or suspicious lesions. Neurologic: Grossly intact, no focal deficits, moving all 4 extremities. Psychiatric: Normal mood and affect.  Laboratory Data: Lab Results  Component Value Date   WBC 30.1 (H) 10/21/2020   HGB 11.6 (L) 10/21/2020   HCT 34.9 (L) 10/21/2020   MCV 93.8 10/21/2020   PLT 162 10/21/2020    Lab Results  Component Value Date   CREATININE 0.98 10/21/2020    No results found for: PSA  No results found for: TESTOSTERONE  No results found for: HGBA1C  Urinalysis No results found for:  COLORURINE, APPEARANCEUR, LABSPEC, PHURINE, GLUCOSEU, HGBUR, BILIRUBINUR, KETONESUR, PROTEINUR, UROBILINOGEN, NITRITE, LEUKOCYTESUR  No results found for: LABMICR, [place], RBCUA, LABEPIT, MUCUS, BACTERIA  Pertinent Imaging: MRi 09/12/2020: Images reviewed and discussed with the patient No results found for this or any previous visit.  No results found for this or any previous visit.  No results found for this or any previous visit.  No results found for this or any previous visit.  No results found for this or any previous visit.  No results found for this or any previous visit.  No results found for this or any previous visit.  No results found for this or any previous visit.   Assessment & Plan:    1. Kidney lesion We discussed the natural hx of renal masses and the 20% malignant likelihood. We disucssed the treatment options including active surveillance. Renal ablation, partial  and radical nephrectomy. After discussing the options the patient elects for actve surviellance. RTC 6 months with MRI abd   - Urinalysis, Routine w reflex microscopic   No follow-ups on file.  Nicolette Bang, MD  [facility] [place]

## 2020-11-03 ENCOUNTER — Other Ambulatory Visit (HOSPITAL_COMMUNITY): Payer: Self-pay

## 2020-11-03 MED ORDER — ENSURE ENLIVE PO LIQD: 237.0000 mL | Freq: Two times a day (BID) | ORAL | 12 refills | Status: DC

## 2020-11-03 NOTE — Telephone Encounter (Signed)
Order placed for Ensure BID between meals and faxed to [facility].

## 2020-11-04 ENCOUNTER — Inpatient Hospital Stay (HOSPITAL_BASED_OUTPATIENT_CLINIC_OR_DEPARTMENT_OTHER): Payer: Medicare Other | Admitting: Hematology

## 2020-11-04 ENCOUNTER — Inpatient Hospital Stay (HOSPITAL_COMMUNITY): Payer: Medicare Other | Attending: Hematology

## 2020-11-04 ENCOUNTER — Other Ambulatory Visit: Payer: Self-pay

## 2020-11-04 ENCOUNTER — Other Ambulatory Visit (HOSPITAL_COMMUNITY): Payer: Self-pay | Admitting: *Deleted

## 2020-11-04 ENCOUNTER — Inpatient Hospital Stay (HOSPITAL_COMMUNITY): Payer: Medicare Other

## 2020-11-04 VITALS — BP 147/66 | HR 80 | Temp 97.4°F | Resp 18 | Wt 172.8 lb

## 2020-11-04 VITALS — BP 127/71 | HR 75 | Temp 97.3°F | Resp 18

## 2020-11-04 DIAGNOSIS — Z5189 Encounter for other specified aftercare: Secondary | ICD-10-CM | POA: Diagnosis not present

## 2020-11-04 DIAGNOSIS — Z452 Encounter for adjustment and management of vascular access device: Secondary | ICD-10-CM | POA: Insufficient documentation

## 2020-11-04 DIAGNOSIS — Z95828 Presence of other vascular implants and grafts: Secondary | ICD-10-CM

## 2020-11-04 DIAGNOSIS — C20 Malignant neoplasm of rectum: Secondary | ICD-10-CM | POA: Insufficient documentation

## 2020-11-04 DIAGNOSIS — Z5111 Encounter for antineoplastic chemotherapy: Secondary | ICD-10-CM | POA: Diagnosis present

## 2020-11-04 DIAGNOSIS — E876 Hypokalemia: Secondary | ICD-10-CM | POA: Insufficient documentation

## 2020-11-04 DIAGNOSIS — R197 Diarrhea, unspecified: Secondary | ICD-10-CM | POA: Diagnosis not present

## 2020-11-04 LAB — CBC WITH DIFFERENTIAL/PLATELET
Band Neutrophils: 6 %
Basophils Absolute: 0 10*3/uL (ref 0.0–0.1)
Basophils Relative: 0 %
Blasts: 3 %
Eosinophils Absolute: 0.5 10*3/uL (ref 0.0–0.5)
Eosinophils Relative: 2 %
HCT: 29.4 % — ABNORMAL LOW (ref 36.0–46.0)
Hemoglobin: 9.5 g/dL — ABNORMAL LOW (ref 12.0–15.0)
Lymphocytes Relative: 13 %
Lymphs Abs: 3.3 10*3/uL (ref 0.7–4.0)
MCH: 30.6 pg (ref 26.0–34.0)
MCHC: 32.3 g/dL (ref 30.0–36.0)
MCV: 94.8 fL (ref 80.0–100.0)
Metamyelocytes Relative: 8 %
Monocytes Absolute: 1.3 10*3/uL — ABNORMAL HIGH (ref 0.1–1.0)
Monocytes Relative: 5 %
Myelocytes: 5 %
Neutro Abs: 15.9 10*3/uL — ABNORMAL HIGH (ref 1.7–7.7)
Neutrophils Relative %: 57 %
Platelets: 163 10*3/uL (ref 150–400)
Promyelocytes Relative: 1 %
RBC: 3.1 MIL/uL — ABNORMAL LOW (ref 3.87–5.11)
RDW: 17.2 % — ABNORMAL HIGH (ref 11.5–15.5)
WBC: 25.3 10*3/uL — ABNORMAL HIGH (ref 4.0–10.5)
nRBC: 0.8 % — ABNORMAL HIGH (ref 0.0–0.2)

## 2020-11-04 LAB — COMPREHENSIVE METABOLIC PANEL
ALT: 30 U/L (ref 0–44)
AST: 37 U/L (ref 15–41)
Albumin: 3.9 g/dL (ref 3.5–5.0)
Alkaline Phosphatase: 146 U/L — ABNORMAL HIGH (ref 38–126)
Anion gap: 10 (ref 5–15)
BUN: 26 mg/dL — ABNORMAL HIGH (ref 8–23)
CO2: 24 mmol/L (ref 22–32)
Calcium: 9.3 mg/dL (ref 8.9–10.3)
Chloride: 107 mmol/L (ref 98–111)
Creatinine, Ser: 0.99 mg/dL (ref 0.44–1.00)
GFR, Estimated: 59 mL/min — ABNORMAL LOW (ref >60)
Glucose, Bld: 101 mg/dL — ABNORMAL HIGH (ref 70–99)
Potassium: 3.1 mmol/L — ABNORMAL LOW (ref 3.5–5.1)
Sodium: 141 mmol/L (ref 135–145)
Total Bilirubin: 0.4 mg/dL (ref 0.3–1.2)
Total Protein: 6.7 g/dL (ref 6.5–8.1)

## 2020-11-04 MED ORDER — SODIUM CHLORIDE 0.9% FLUSH: 10.0000 mL | INTRAVENOUS | Status: DC | PRN

## 2020-11-04 MED ORDER — DEXTROSE 5 % IV SOLN: Freq: Once | INTRAVENOUS | Status: AC

## 2020-11-04 MED ORDER — FLUOROURACIL CHEMO INJECTION 2.5 GM/50ML
400.0000 mg/m2 | Freq: Once | INTRAVENOUS | Status: AC
  Administered 2020-11-04: 750 mg via INTRAVENOUS
  Filled 2020-11-04: qty 15

## 2020-11-04 MED ORDER — HEPARIN SOD (PORK) LOCK FLUSH 100 UNIT/ML IV SOLN: 500.0000 [IU] | Freq: Once | INTRAVENOUS | Status: DC | PRN

## 2020-11-04 MED ORDER — SODIUM CHLORIDE 0.9 % IV SOLN
2400.0000 mg/m2 | INTRAVENOUS | Status: DC
  Administered 2020-11-04: 4400 mg via INTRAVENOUS
  Filled 2020-11-04: qty 88

## 2020-11-04 MED ORDER — PROCHLORPERAZINE MALEATE 10 MG PO TABS: 10.0000 mg | ORAL_TABLET | Freq: Four times a day (QID) | ORAL | 1 refills | Status: DC | PRN

## 2020-11-04 MED ORDER — POTASSIUM CHLORIDE CRYS ER 20 MEQ PO TBCR
40.0000 meq | EXTENDED_RELEASE_TABLET | Freq: Once | ORAL | Status: AC
  Administered 2020-11-04: 40 meq via ORAL
  Filled 2020-11-04: qty 2

## 2020-11-04 MED ORDER — LEUCOVORIN CALCIUM INJECTION 350 MG
380.0000 mg/m2 | Freq: Once | INTRAVENOUS | Status: AC
  Administered 2020-11-04: 700 mg via INTRAVENOUS
  Filled 2020-11-04: qty 35

## 2020-11-04 MED ORDER — POTASSIUM CHLORIDE CRYS ER 20 MEQ PO TBCR: 20.0000 meq | EXTENDED_RELEASE_TABLET | Freq: Every day | ORAL | 2 refills | Status: DC

## 2020-11-04 MED ORDER — PALONOSETRON HCL INJECTION 0.25 MG/5ML
INTRAVENOUS | Status: AC
  Filled 2020-11-04: qty 5

## 2020-11-04 MED ORDER — OXALIPLATIN CHEMO INJECTION 100 MG/20ML
55.0000 mg/m2 | Freq: Once | INTRAVENOUS | Status: AC
  Administered 2020-11-04: 100 mg via INTRAVENOUS
  Filled 2020-11-04: qty 20

## 2020-11-04 MED ORDER — SODIUM CHLORIDE 0.9 % IV SOLN
10.0000 mg | Freq: Once | INTRAVENOUS | Status: AC
  Administered 2020-11-04: 10 mg via INTRAVENOUS
  Filled 2020-11-04: qty 10

## 2020-11-04 MED ORDER — PALONOSETRON HCL INJECTION 0.25 MG/5ML
0.2500 mg | Freq: Once | INTRAVENOUS | Status: AC
  Administered 2020-11-04: 0.25 mg via INTRAVENOUS

## 2020-11-04 NOTE — Patient Instructions (Signed)
[facility] at [facility] °Discharge Instructions ° °You were seen today by Dr. Katragadda. He went over your recent results. You received your treatment today. Dr. Katragadda will see you back in 2 weeks for labs and follow up. ° ° °Thank you for choosing [facility] at [facility] to provide your oncology and hematology care.  To afford each patient quality time with our provider, please arrive at least 15 minutes before your scheduled appointment time.  ° °If you have a lab appointment with the Cancer Center please come in thru the Main Entrance and check in at the main information desk ° °You need to re-schedule your appointment should you arrive 10 or more minutes late.  We strive to give you quality time with our providers, and arriving late affects you and other patients whose appointments are after yours.  Also, if you no show three or more times for appointments you may be dismissed from the clinic at the providers discretion.     °Again, thank you for choosing [facility].  Our hope is that these requests will decrease the amount of time that you wait before being seen by our physicians.       °_____________________________________________________________ ° °Should you have questions after your visit to [facility], please contact our office at [phone] between the hours of 8:00 a.m. and 4:30 p.m.  Voicemails left after 4:00 p.m. will not be returned until the following business day.  For prescription refill requests, have your pharmacy contact our office and allow 72 hours.   ° °Cancer Center Support Programs:  ° °> Cancer Support Group  °2nd Tuesday of the month 1pm-2pm, Journey Room  ° ° °

## 2020-11-04 NOTE — Progress Notes (Signed)
[place], [place] [address]   CLINIC:  Medical Oncology/Hematology  PCP:  Roderic Scarce, MD [address] [name] / [place] [address] [phone]   REASON FOR VISIT:  Follow-up for rectal cancer  PRIOR THERAPY: Transanal resection of polyp in 10/2012  NGS Results: Not done  CURRENT THERAPY: FOLFOX every 2 weeks  BRIEF ONCOLOGIC HISTORY:  Oncology History  Rectal cancer ([place])  08/14/2020 Initial Diagnosis   Rectal cancer ([place])   09/02/2020 Cancer Staging   Staging form: Colon and Rectum, AJCC 8th Edition - Clinical stage from 09/02/2020: Stage IIA (cT3, cN0, cM0) - Signed by [name], MD on 09/02/2020   09/16/2020 -  Chemotherapy   The patient had palonosetron (ALOXI) injection 0.25 mg, 0.25 mg, Intravenous,  Once, 3 of 8 cycles Administration: 0.25 mg (09/16/2020), 0.25 mg (10/07/2020), 0.25 mg (10/21/2020) pegfilgrastim-cbqv (UDENYCA) injection 6 mg, 6 mg, Subcutaneous, Once, 2 of 7 cycles Administration: 6 mg (10/09/2020), 6 mg (10/23/2020) leucovorin 700 mg in dextrose 5 % 250 mL infusion, 380 mg/m2 = 736 mg, Intravenous,  Once, 3 of 8 cycles Administration: 700 mg (09/16/2020), 700 mg (10/07/2020), 700 mg (10/21/2020) oxaliplatin (ELOXATIN) 125 mg in dextrose 5 % 500 mL chemo infusion, 68 mg/m2 = 125 mg (80 % of original dose 85 mg/m2), Intravenous,  Once, 3 of 8 cycles Dose modification: 68 mg/m2 (80 % of original dose 85 mg/m2, Cycle 1, Reason: Provider Judgment), 68 mg/m2 (80 % of original dose 85 mg/m2, Cycle 2, Reason: Provider Judgment), 55 mg/m2 (original dose 85 mg/m2, Cycle 4, Reason: Other (see comments), Comment: neuropathy) Administration: 125 mg (09/16/2020), 125 mg (10/07/2020), 125 mg (10/21/2020) fluorouracil (ADRUCIL) chemo injection 750 mg, 400 mg/m2 = 750 mg, Intravenous,  Once, 3 of 8 cycles Administration: 750 mg (09/16/2020), 750 mg (10/07/2020), 750 mg (10/21/2020) fluorouracil (ADRUCIL) 4,400 mg in  sodium chloride 0.9 % 62 mL chemo infusion, 2,400 mg/m2 = 4,400 mg, Intravenous, 1 Day/Dose, 3 of 8 cycles Administration: 4,400 mg (09/16/2020), 4,400 mg (10/07/2020), 4,400 mg (10/21/2020)  for chemotherapy treatment.      CANCER STAGING: Cancer Staging Rectal cancer [facility]) Staging form: Colon and Rectum, AJCC 8th Edition - Clinical stage from 09/02/2020: Stage IIA (cT3, cN0, cM0) - Signed by [name], MD on 09/02/2020   INTERVAL HISTORY:  Brenda Mclean, a 74 y.o. female, returns for routine follow-up and consideration for next cycle of chemotherapy. Brenda Mclean was last seen on 10/21/2020.  Due for cycle #4 of FOLFOX today.   Overall, she tells me she has been feeling good. She reports having soreness in her nostrils and burning and tingling on the lateral edge of her right foot which started 2 days ago, but no numbness or tingling in toes or hands. She developed a nosebleed this morning after blowing her nose. She also reports having intermittent diarrhea after the previous treatment and some fatigue, but no extreme fatigue. She denies changes in her taste and her appetite is excellent.  She is requesting a prescription for Ensure to receive it from the pharmacy.  Overall, she feels ready for next cycle of chemo today.    REVIEW OF SYSTEMS:  Review of Systems  Constitutional: Positive for fatigue (80%). Negative for appetite change.  HENT:   Positive for nosebleeds (sore nostrils) and sore throat.   Gastrointestinal: Positive for abdominal pain and diarrhea (intermittent).  Neurological: Positive for numbness (4/10 pain w/ burning & tingling in lateral edge of R  foot).  All other systems reviewed and are negative.   PAST MEDICAL/SURGICAL HISTORY:  Past Medical History:  Diagnosis Date  . Arthritis   . COVID-19 03/2019  . GERD (gastroesophageal reflux disease)   . HLD (hyperlipidemia)   . HTN (hypertension)   . Port-A-Cath in place 09/09/2020  . 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 Sigmoid diverticulitis 8341   Uncomplicated   Past Surgical History:  Procedure Laterality Date  . BIOPSY  07/10/2020   Procedure: BIOPSY;  Surgeon: [name], MD;  [facility]: [facility];  Service: Endoscopy;;  . CHOLECYSTECTOMY  1998  . COLON SURGERY  10/30/2012   Dr. Audrie Lia; transanal resection of 2 cm rectal polyp; pathology revealed superficially invasive moderately differentiated adenocarcinoma arising in adenomatous polyp of distal rectum.  . COLONOSCOPY  10/24/2013   Dr. West Carbo; with propofol; small granuloma and sutures distal rectum, small polyp mid rectum removed, sigmoid diverticulosis, otherwise normal exam.  No pathology received.  Recommended colonoscopy in 5 years.  . COLONOSCOPY  09/08/2012   Dr. West Carbo; with propofol; multi lobulated friable 3 cm mass distal rectum s/p multiple biopsies, scattered sigmoid diverticulosis, otherwise normal exam.  Pathology with adenomatous polyp.  . COLONOSCOPY WITH PROPOFOL N/A 07/10/2020   Procedure: COLONOSCOPY WITH PROPOFOL;  Surgeon: [name], MD;  Location: [facility];  Service: Endoscopy;  Laterality: N/A;  10:00am  . POLYPECTOMY  07/10/2020   Procedure: POLYPECTOMY;  Surgeon: [name], MD;  Location: [facility];  Service: Endoscopy;;  . PORTACATH PLACEMENT Left 09/09/2020   Procedure: INSERTION PORT-A-CATH;  Surgeon: Aviva Signs, MD;  Location: [facility] ORS;  Service: General;  Laterality: Left;  . REPLACEMENT TOTAL HIP W/  RESURFACING IMPLANTS Bilateral   . 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 REPLACEMENT TOTAL KNEE Right 12/03/2014  . TOTAL SHOULDER ARTHROPLASTY  04/26/2018    SOCIAL HISTORY:  Social History   Socioeconomic History  . Marital status: Married    Spouse name: Not on file  . Number of children: 2  . Years of education: Not on file  . Highest education level: Not on file  Occupational History    Employer: retired  Tobacco Use  . Smoking status:  Never Smoker  . Smokeless tobacco: Never Used  Vaping Use  . Vaping Use: Never used  Substance and Sexual Activity  . Alcohol use: Never  . Drug use: Never  . Sexual activity: Not Currently  Other Topics Concern  . Not on file  Social History Narrative  . Not on file   Social Determinants of Health   Financial Resource Strain: Low Risk   . Difficulty of Paying Living Expenses: Not hard at all  Food Insecurity: No Food Insecurity  . Worried About Charity fundraiser in the Last Year: Never true  . Ran Out of Food in the Last Year: Never true  Transportation Needs: No Transportation Needs  . Lack of Transportation (Medical): No  . Lack of Transportation (Non-Medical): No  Physical Activity: Inactive  . Days of Exercise per Week: 0 days  . Minutes of Exercise per Session: 0 min  Stress: No Stress Concern Present  . Feeling of Stress : Only a little  Social Connections: Moderately Integrated  . Frequency of Communication with Friends and Family: More than three times a week  . Frequency of Social Gatherings with Friends and Family: More than three times a week  . Attends Religious Services: More than 4 times per year  .  Active Member of Clubs or Organizations: No  . Attends Archivist Meetings: Never  . Marital Status: Married  Human resources officer Violence: Not At Risk  . Fear of Current or Ex-Partner: No  . Emotionally Abused: No  . Physically Abused: No  . Sexually Abused: No    FAMILY HISTORY:  Family History  Problem Relation Age of Onset  . Arthritis Mother   . High blood pressure Mother   . Heart disease Father   . Breast cancer Maternal Aunt   . Stroke Maternal Grandmother   . Cancer Paternal Grandmother   . Diabetes Brother   . Colon cancer Neg Hx     CURRENT MEDICATIONS:  Current Outpatient Medications  Medication Sig Dispense Refill  . celecoxib (CELEBREX) 200 MG capsule Take 200 mg by mouth daily.    Marland Kitchen dexamethasone 10 mg in sodium chloride 0.9  % 50 mL Inject 10 mg into the vein every 14 (fourteen) days. Prior to chemotherapy administration    . feeding supplement (ENSURE ENLIVE / ENSURE PLUS) LIQD Take 237 mLs by mouth 2 (two) times daily between meals. 237 mL 12  . fluorouracil CALGB 01779 in sodium chloride 0.9 % 150 mL Inject 2,400 mg/m2 into the vein over 48 hr.    . FLUOROURACIL IV Inject 400 mg/m2 into the vein every 14 (fourteen) days.    Marland Kitchen LEUCOVORIN CALCIUM IV Inject 400 mg/m2 into the vein every 14 (fourteen) days.    . Multiple Vitamin (MULTIVITAMIN WITH MINERALS) TABS tablet Take 1 tablet by mouth daily.    Marland Kitchen omega-3 acid ethyl esters (LOVAZA) 1 g capsule Take 2 g by mouth daily.    Marland Kitchen omeprazole (PRILOSEC) 20 MG capsule Take 20 mg by mouth daily.    . OXALIPLATIN IV Inject 85 mg/m2 into the vein every 14 (fourteen) days.    . palonosetron (ALOXI) 0.25 MG/5ML injection Inject 0.25 mg into the vein every 14 (fourteen) days. Prior to chemotherapy administration    . simvastatin (ZOCOR) 40 MG tablet Take 40 mg by mouth at bedtime.     . traMADol (ULTRAM) 50 MG tablet Take 1 tablet (50 mg total) by mouth every 6 (six) hours as needed. 20 tablet 0  . Turmeric 500 MG TABS Take 500 mg by mouth daily.    . valsartan (DIOVAN) 320 MG tablet Take 320 mg by mouth daily.    Marland Kitchen lidocaine-prilocaine (EMLA) cream Apply a small amount to port a cath site and cover with plastic wrap 1 hour prior to chemotherapy appointments (Patient not taking: Reported on 11/04/2020) 30 g 3  . potassium chloride SA (KLOR-CON) 20 MEQ tablet Take 1 tablet (20 mEq total) by mouth daily. 30 tablet 2  . prochlorperazine (COMPAZINE) 10 MG tablet Take 1 tablet (10 mg total) by mouth every 6 (six) hours as needed (Nausea or vomiting). (Patient not taking: Reported on 11/04/2020) 30 tablet 1   No current facility-administered medications for this visit.    ALLERGIES:  No Known Allergies  PHYSICAL EXAM:  Performance status (ECOG): 0 - Asymptomatic  Vitals:    11/04/20 0833  BP: (!) 147/66  Pulse: 80  Resp: 18  Temp: (!) 97.4 F (36.3 C)  SpO2: 100%   Wt Readings from Last 3 Encounters:  11/04/20 172 lb 12.8 oz (78.4 kg)  10/24/20 176 lb 6.4 oz (80 kg)  10/21/20 176 lb 6.4 oz (80 kg)   Physical Exam Vitals reviewed.  Constitutional:      Appearance: Normal appearance.  She is obese.  Cardiovascular:     Rate and Rhythm: Normal rate and regular rhythm.     Pulses: Normal pulses.     Heart sounds: Normal heart sounds.  Pulmonary:     Effort: Pulmonary effort is normal.     Breath sounds: Normal breath sounds.  Chest:     Comments: Port-a-Cath in L chest Musculoskeletal:     Right lower leg: No edema.     Left lower leg: No edema.  Neurological:     General: No focal deficit present.     Mental Status: She is alert and oriented to person, place, and time.  Psychiatric:        Mood and Affect: Mood normal.        Behavior: Behavior normal.     LABORATORY DATA:  I have reviewed the labs as listed.  CBC Latest Ref Rng & Units 11/04/2020 10/21/2020 10/07/2020  WBC 4.0 - 10.5 K/uL 25.3(H) 30.1(H) 10.9(H)  Hemoglobin 12.0 - 15.0 g/dL 9.5(L) 11.6(L) 12.4  Hematocrit 36 - 46 % 29.4(L) 34.9(L) 38.4  Platelets 150 - 400 K/uL 163 162 360   CMP Latest Ref Rng & Units 11/04/2020 10/21/2020 10/07/2020  Glucose 70 - 99 mg/dL 101(H) 92 95  BUN 8 - 23 mg/dL 26(H) 18 17  Creatinine 0.44 - 1.00 mg/dL 0.99 0.98 0.91  Sodium 135 - 145 mmol/L 141 143 141  Potassium 3.5 - 5.1 mmol/L 3.1(L) 3.4(L) 3.5  Chloride 98 - 111 mmol/L 107 107 104  CO2 22 - 32 mmol/L _0 Calcium 8.9 - 10.3 mg/dL 9.3 9.7 9.2  Total Protein 6.5 - 8.1 g/dL 6.7 6.8 6.8  Total Bilirubin 0.3 - 1.2 mg/dL 0.4 0.6 0.5  Alkaline Phos 38 - 126 U/L 146(H) 152(H) 96  AST 15 - 41 U/L 37 38 36  ALT 0 - 44 U/L 30 27 40    DIAGNOSTIC IMAGING:  I have independently reviewed the scans and discussed with the patient. No results found.   ASSESSMENT:  1. Rectal  adenocarcinoma: -History of rectal polyp with adenocarcinoma, status post transanal resection of the polyp in November 2013. -Colonoscopy on July 10, 2020 shows 2.5 cm centrally depressed ulcerated lesion with heaped up circular margin, 8 cm from anal verge. Distally, just above the anal verge, suture line consistent with history of prior transanal polypectomy. -Pathology of the rectal mass consistent with adenocarcinoma. Splenic flexure polypectomy was tubular adenoma. Sigmoid colon biopsy was benign. -CT CAP with contrast on August 05, 2020 shows no evidence of neoplastic disease within the chest, abdomen or pelvis. Complicated left midpole renal cyst versus complex cystic mass. No lung masses. -Pelvis MRI on 08/27/2020 showed T3BN0 tumor. Distance of tumor/node from mesorectal fascia was 1 cm. Distance from tumor to the internal anal sphincter was 4.2 cm. Tumor size is 2.2 x 2.2 cm. -Total neoadjuvant therapy with 4 months of FOLFOX followed by long course chemo RT with Xeloda followed by restaging pelvis MRI 6 to 8 weeks after completion of XRT followed by resection recommended. -Cycle 1 of FOLFOX on 09/16/2020  2. Social/family history: -She lives at home with her husband. She worked as a Counselling psychologist for 47 years. No history of smoking. -Maternal aunt had breast cancer.   PLAN:  1.Stage II (T3N0) rectal adenocarcinoma: -She did not have any major GI side effects. -Reviewed labs which showed normal LFTs but elevated alk phos of 146.  Bilirubin normal.  White count is elevated secondary to G-CSF. -  She reported right lateral foot pain. -Will reduce oxaliplatin dose to 55 mg/M square. -RTC 2 weeks.  2. Left kidney lesion: -She had followed up with Dr. Alyson Ingles who has recommended MRI in 6 months.  Patient would want to delay her MRI.  3. Hypokalemia:  -Potassium today is 3.1.  Will replete potassium. -We will start her on potassium 20 mEq daily at home.   Orders placed  this encounter:  No orders of the defined types were placed in this encounter.    [name], MD [place] [phone]   I, Milinda Antis, am acting as a scribe for Dr. Sanda Linger.  I, [name] MD, have reviewed the above documentation for accuracy and completeness, and I agree with the above.

## 2020-11-04 NOTE — Progress Notes (Signed)
Patient was assessed by Dr. Delton Coombes and labs have been reviewed.  Potassium 3.1 today, orders received for potassium 40 mEq once today by mouth.  Prescription sent to her pharmacy for potassium 20 mEq by mouth daily.  Patient is okay to proceed with treatment today. Primary RN and pharmacy aware.

## 2020-11-04 NOTE — Patient Instructions (Signed)
[facility] Discharge Instructions for Patients Receiving Chemotherapy  Today you received the following chemotherapy agents   To help prevent nausea and vomiting after your treatment, we encourage you to take your nausea medication   If you develop nausea and vomiting that is not controlled by your nausea medication, call the clinic.   BELOW ARE SYMPTOMS THAT SHOULD BE REPORTED IMMEDIATELY:  *FEVER GREATER THAN 100.5 F  *CHILLS WITH OR WITHOUT FEVER  NAUSEA AND VOMITING THAT IS NOT CONTROLLED WITH YOUR NAUSEA MEDICATION  *UNUSUAL SHORTNESS OF BREATH  *UNUSUAL BRUISING OR BLEEDING  TENDERNESS IN MOUTH AND THROAT WITH OR WITHOUT PRESENCE OF ULCERS  *URINARY PROBLEMS  *BOWEL PROBLEMS  UNUSUAL RASH Items with * indicate a potential emergency and should be followed up as soon as possible.  Feel free to call the clinic should you have any questions or concerns. The clinic phone number is [phone].  Please show the CHEMO ALERT CARD at check-in to the Emergency Department and triage nurse.   

## 2020-11-04 NOTE — Progress Notes (Signed)
Pt here for D1C4 of FOLFOX. Potassium 3.1 today, orders received for potassium 40 mEq once today by mouth. Prescription sent to her pharmacy for potassium 20 mEq by mouth daily. Patient is okay to proceed with treatment today.  Oxaliplatin dose reduced further due to her neuropathy.  Tolerated treatment well today without incidence.  Discharged ambulatory with ambulatory pump in stable condition.  Vital signs stable prior to discharge.

## 2020-11-04 NOTE — Patient Instructions (Signed)
[facility] at [facility] Discharge Instructions  Labs drawn from portacath today   Thank you for choosing [facility] at [facility] to provide your oncology and hematology care.  To afford each patient quality time with our provider, please arrive at least 15 minutes before your scheduled appointment time.   If you have a lab appointment with the Cancer Center please come in thru the Main Entrance and check in at the main information desk.  You need to re-schedule your appointment should you arrive 10 or more minutes late.  We strive to give you quality time with our providers, and arriving late affects you and other patients whose appointments are after yours.  Also, if you no show three or more times for appointments you may be dismissed from the clinic at the providers discretion.     Again, thank you for choosing [facility].  Our hope is that these requests will decrease the amount of time that you wait before being seen by our physicians.       _____________________________________________________________  Should you have questions after your visit to [facility], please contact our office at [phone] and follow the prompts.  Our office hours are 8:00 a.m. and 4:30 p.m. Monday - Friday.  Please note that voicemails left after 4:00 p.m. may not be returned until the following business day.  We are closed weekends and major holidays.  You do have access to a nurse 24-7, just call the main number to the clinic [phone] and do not press any options, hold on the line and a nurse will answer the phone.    For prescription refill requests, have your pharmacy contact our office and allow 72 hours.    Due to Covid, you will need to wear a mask upon entering the hospital. If you do not have a mask, a mask will be given to you at the Main Entrance upon arrival. For doctor visits, patients may have 1 support person age 18  or older with them. For treatment visits, patients can not have anyone with them due to social distancing guidelines and our immunocompromised population.     

## 2020-11-06 ENCOUNTER — Inpatient Hospital Stay (HOSPITAL_COMMUNITY): Payer: Medicare Other

## 2020-11-06 ENCOUNTER — Other Ambulatory Visit: Payer: Self-pay

## 2020-11-06 ENCOUNTER — Encounter (HOSPITAL_COMMUNITY): Payer: Self-pay

## 2020-11-06 VITALS — BP 117/52 | HR 76 | Temp 97.1°F | Resp 18

## 2020-11-06 DIAGNOSIS — Z5111 Encounter for antineoplastic chemotherapy: Secondary | ICD-10-CM | POA: Diagnosis not present

## 2020-11-06 DIAGNOSIS — Z95828 Presence of other vascular implants and grafts: Secondary | ICD-10-CM

## 2020-11-06 DIAGNOSIS — C20 Malignant neoplasm of rectum: Secondary | ICD-10-CM

## 2020-11-06 MED ORDER — PEGFILGRASTIM-CBQV 6 MG/0.6ML ~~LOC~~ SOSY
PREFILLED_SYRINGE | SUBCUTANEOUS | Status: AC
  Filled 2020-11-06: qty 0.6

## 2020-11-06 MED ORDER — SODIUM CHLORIDE 0.9% FLUSH
10.0000 mL | INTRAVENOUS | Status: DC | PRN
  Administered 2020-11-06: 10 mL

## 2020-11-06 MED ORDER — PEGFILGRASTIM-CBQV 6 MG/0.6ML ~~LOC~~ SOSY
6.0000 mg | PREFILLED_SYRINGE | Freq: Once | SUBCUTANEOUS | Status: AC
  Administered 2020-11-06: 6 mg via SUBCUTANEOUS

## 2020-11-06 MED ORDER — HEPARIN SOD (PORK) LOCK FLUSH 100 UNIT/ML IV SOLN
500.0000 [IU] | Freq: Once | INTRAVENOUS | Status: AC | PRN
  Administered 2020-11-06: 500 [IU]

## 2020-11-06 NOTE — Patient Instructions (Addendum)
[place] at [facility] Discharge Instructions  5FU pump discontinued with portacath flushed per protocol and Udenyca injection given. Follow-up as scheduled   Thank you for choosing [place] at [facility] to provide your oncology and hematology care.  To afford each patient quality time with our provider, please arrive at least 15 minutes before your scheduled appointment time.   If you have a lab appointment with the [facility] please come in thru the Main Entrance and check in at the main information desk.  You need to re-schedule your appointment should you arrive 10 or more minutes late.  We strive to give you quality time with our providers, and arriving late affects you and other patients whose appointments are after yours.  Also, if you no show three or more times for appointments you may be dismissed from the clinic at the providers discretion.     Again, thank you for choosing [facility].  Our hope is that these requests will decrease the amount of time that you wait before being seen by our physicians.       _____________________________________________________________  Should you have questions after your visit to [facility], please contact our office at [phone] and follow the prompts.  Our office hours are 8:00 a.m. and 4:30 p.m. Monday - Friday.  Please note that voicemails left after 4:00 p.m. may not be returned until the following business day.  We are closed weekends and major holidays.  You do have access to a nurse 24-7, just call the main number to the clinic [phone] and do not press any options, hold on the line and a nurse will answer the phone.    For prescription refill requests, have your pharmacy contact our office and allow 72 hours.    Due to Covid, you will need to wear a mask upon entering the hospital. If you do not have a mask, a mask will be given to you at the Main  Entrance upon arrival. For doctor visits, patients may have 1 support person age 21 or older with them. For treatment visits, patients can not have anyone with them due to social distancing guidelines and our immunocompromised population.

## 2020-11-06 NOTE — Progress Notes (Signed)
Brenda Mclean tolerated 5FU pump well without complaints or incident. 5FU pump discontinued with portacath flushed easily per protocol then de-accessed. Udenyca injection given as well and pt tolerated well. VSS Pt discharged self ambulatory in satisfactory condition

## 2020-11-11 ENCOUNTER — Telehealth (HOSPITAL_COMMUNITY): Payer: Self-pay | Admitting: Surgery

## 2020-11-11 ENCOUNTER — Other Ambulatory Visit: Payer: Self-pay

## 2020-11-11 ENCOUNTER — Encounter (HOSPITAL_COMMUNITY): Payer: Self-pay

## 2020-11-11 ENCOUNTER — Inpatient Hospital Stay (HOSPITAL_COMMUNITY): Payer: Medicare Other

## 2020-11-11 VITALS — BP 133/72 | HR 82 | Temp 97.8°F | Resp 20 | Wt 170.0 lb

## 2020-11-11 DIAGNOSIS — Z5111 Encounter for antineoplastic chemotherapy: Secondary | ICD-10-CM | POA: Diagnosis not present

## 2020-11-11 DIAGNOSIS — Z95828 Presence of other vascular implants and grafts: Secondary | ICD-10-CM

## 2020-11-11 DIAGNOSIS — R197 Diarrhea, unspecified: Secondary | ICD-10-CM

## 2020-11-11 DIAGNOSIS — C20 Malignant neoplasm of rectum: Secondary | ICD-10-CM

## 2020-11-11 DIAGNOSIS — D702 Other drug-induced agranulocytosis: Secondary | ICD-10-CM

## 2020-11-11 LAB — CBC WITH DIFFERENTIAL/PLATELET
Abs Immature Granulocytes: 0.11 10*3/uL — ABNORMAL HIGH (ref 0.00–0.07)
Basophils Absolute: 0 10*3/uL (ref 0.0–0.1)
Basophils Relative: 1 %
Eosinophils Absolute: 0 10*3/uL (ref 0.0–0.5)
Eosinophils Relative: 2 %
HCT: 26.7 % — ABNORMAL LOW (ref 36.0–46.0)
Hemoglobin: 8.6 g/dL — ABNORMAL LOW (ref 12.0–15.0)
Immature Granulocytes: 5 %
Lymphocytes Relative: 41 %
Lymphs Abs: 1 10*3/uL (ref 0.7–4.0)
MCH: 30.9 pg (ref 26.0–34.0)
MCHC: 32.2 g/dL (ref 30.0–36.0)
MCV: 96 fL (ref 80.0–100.0)
Monocytes Absolute: 0.1 10*3/uL (ref 0.1–1.0)
Monocytes Relative: 3 %
Neutro Abs: 1.2 10*3/uL — ABNORMAL LOW (ref 1.7–7.7)
Neutrophils Relative %: 48 %
Platelets: 55 10*3/uL — ABNORMAL LOW (ref 150–400)
RBC: 2.78 MIL/uL — ABNORMAL LOW (ref 3.87–5.11)
RDW: 17.7 % — ABNORMAL HIGH (ref 11.5–15.5)
WBC: 2.4 10*3/uL — ABNORMAL LOW (ref 4.0–10.5)
nRBC: 0 % (ref 0.0–0.2)

## 2020-11-11 LAB — COMPREHENSIVE METABOLIC PANEL
ALT: 18 U/L (ref 0–44)
AST: 31 U/L (ref 15–41)
Albumin: 3.9 g/dL (ref 3.5–5.0)
Alkaline Phosphatase: 199 U/L — ABNORMAL HIGH (ref 38–126)
Anion gap: 9 (ref 5–15)
BUN: 31 mg/dL — ABNORMAL HIGH (ref 8–23)
CO2: 23 mmol/L (ref 22–32)
Calcium: 9.3 mg/dL (ref 8.9–10.3)
Chloride: 106 mmol/L (ref 98–111)
Creatinine, Ser: 0.96 mg/dL (ref 0.44–1.00)
GFR, Estimated: >60 mL/min (ref >60)
Glucose, Bld: 111 mg/dL — ABNORMAL HIGH (ref 70–99)
Potassium: 4.6 mmol/L (ref 3.5–5.1)
Sodium: 138 mmol/L (ref 135–145)
Total Bilirubin: 0.9 mg/dL (ref 0.3–1.2)
Total Protein: 6.7 g/dL (ref 6.5–8.1)

## 2020-11-11 LAB — MAGNESIUM: Magnesium: 2.4 mg/dL (ref 1.7–2.4)

## 2020-11-11 MED ORDER — HEPARIN SOD (PORK) LOCK FLUSH 100 UNIT/ML IV SOLN
500.0000 [IU] | Freq: Once | INTRAVENOUS | Status: AC
  Administered 2020-11-11: 500 [IU] via INTRAVENOUS

## 2020-11-11 MED ORDER — SODIUM CHLORIDE 0.9 % IV SOLN
Freq: Once | INTRAVENOUS | Status: AC
  Filled 2020-11-11: qty 10

## 2020-11-11 NOTE — Patient Instructions (Signed)
[facility] at [facility]  Discharge Instructions:   _______________________________________________________________  Thank you for choosing [facility] at [facility] to provide your oncology and hematology care.  To afford each patient quality time with our providers, please arrive at least 15 minutes before your scheduled appointment.  You need to re-schedule your appointment if you arrive 10 or more minutes late.  We strive to give you quality time with our providers, and arriving late affects you and other patients whose appointments are after yours.  Also, if you no show three or more times for appointments you may be dismissed from the clinic.  Again, thank you for choosing [facility] at [facility]. Our hope is that these requests will allow you access to exceptional care and in a timely manner. _______________________________________________________________  If you have questions after your visit, please contact our office at [phone] between the hours of 8:30 a.m. and 5:00 p.m. Voicemails left after 4:30 p.m. will not be returned until the following business day. _______________________________________________________________  For prescription refill requests, have your pharmacy contact our office. _______________________________________________________________  Recommendations made by the consultant and any test results will be sent to your referring physician. _______________________________________________________________ 

## 2020-11-11 NOTE — Progress Notes (Signed)
Pt here for fluids.  Pt has been feeling weak and just not herself. Lost 2 lbs since last treatment.  Labs drawn.    Tolerated fluids well today.  Discharged ambulatory with cane in stable condition.  Pt refused wheelchair.

## 2020-11-11 NOTE — Telephone Encounter (Signed)
The pt had left a message yesterday stating that she was very weak and had no appetite.  I called the pt and she stated that she has lost 5 lbs since Friday and has been having diarrhea.  She has only been taking a half tablet of Immodium because she was afraid of getting constipated.  I notified Dr. Delton Coombes of the pt's symptoms, and he wants the pt to come in today for IV fluids and to take 1 whole Immodium tablet after each watery BM.  I called the pt and she verbalized understanding that she is to come as soon as possible to receive her fluids today.

## 2020-11-17 ENCOUNTER — Inpatient Hospital Stay (HOSPITAL_COMMUNITY): Payer: Medicare Other

## 2020-11-17 ENCOUNTER — Other Ambulatory Visit (HOSPITAL_COMMUNITY): Payer: Self-pay | Admitting: *Deleted

## 2020-11-17 ENCOUNTER — Other Ambulatory Visit: Payer: Self-pay

## 2020-11-17 ENCOUNTER — Inpatient Hospital Stay (HOSPITAL_BASED_OUTPATIENT_CLINIC_OR_DEPARTMENT_OTHER): Payer: Medicare Other | Admitting: Hematology

## 2020-11-17 VITALS — BP 143/62 | HR 93 | Temp 96.9°F | Resp 17 | Wt 168.9 lb

## 2020-11-17 DIAGNOSIS — Z95828 Presence of other vascular implants and grafts: Secondary | ICD-10-CM

## 2020-11-17 DIAGNOSIS — Z5111 Encounter for antineoplastic chemotherapy: Secondary | ICD-10-CM | POA: Diagnosis not present

## 2020-11-17 DIAGNOSIS — C20 Malignant neoplasm of rectum: Secondary | ICD-10-CM

## 2020-11-17 LAB — COMPREHENSIVE METABOLIC PANEL
ALT: 21 U/L (ref 0–44)
AST: 26 U/L (ref 15–41)
Albumin: 3.9 g/dL (ref 3.5–5.0)
Alkaline Phosphatase: 154 U/L — ABNORMAL HIGH (ref 38–126)
Anion gap: 7 (ref 5–15)
BUN: 27 mg/dL — ABNORMAL HIGH (ref 8–23)
CO2: 19 mmol/L — ABNORMAL LOW (ref 22–32)
Calcium: 9.1 mg/dL (ref 8.9–10.3)
Chloride: 111 mmol/L (ref 98–111)
Creatinine, Ser: 1.05 mg/dL — ABNORMAL HIGH (ref 0.44–1.00)
GFR, Estimated: 55 mL/min — ABNORMAL LOW (ref >60)
Glucose, Bld: 119 mg/dL — ABNORMAL HIGH (ref 70–99)
Potassium: 3.5 mmol/L (ref 3.5–5.1)
Sodium: 137 mmol/L (ref 135–145)
Total Bilirubin: 0.1 mg/dL — ABNORMAL LOW (ref 0.3–1.2)
Total Protein: 6.9 g/dL (ref 6.5–8.1)

## 2020-11-17 LAB — CBC WITH DIFFERENTIAL/PLATELET
Band Neutrophils: 4 %
Basophils Absolute: 0 10*3/uL (ref 0.0–0.1)
Basophils Relative: 0 %
Blasts: 3 %
Eosinophils Absolute: 0.2 10*3/uL (ref 0.0–0.5)
Eosinophils Relative: 3 %
HCT: 28.3 % — ABNORMAL LOW (ref 36.0–46.0)
Hemoglobin: 9.1 g/dL — ABNORMAL LOW (ref 12.0–15.0)
Lymphocytes Relative: 30 %
Lymphs Abs: 2.3 10*3/uL (ref 0.7–4.0)
MCH: 31.2 pg (ref 26.0–34.0)
MCHC: 32.2 g/dL (ref 30.0–36.0)
MCV: 96.9 fL (ref 80.0–100.0)
Metamyelocytes Relative: 11 %
Monocytes Absolute: 0.9 10*3/uL (ref 0.1–1.0)
Monocytes Relative: 12 %
Myelocytes: 8 %
Neutro Abs: 2.3 10*3/uL (ref 1.7–7.7)
Neutrophils Relative %: 27 %
Platelets: 160 10*3/uL (ref 150–400)
Promyelocytes Relative: 2 %
RBC: 2.92 MIL/uL — ABNORMAL LOW (ref 3.87–5.11)
RDW: 18.2 % — ABNORMAL HIGH (ref 11.5–15.5)
WBC: 7.5 10*3/uL (ref 4.0–10.5)
nRBC: 2 % — ABNORMAL HIGH (ref 0.0–0.2)
nRBC: 2 /100 WBC — ABNORMAL HIGH

## 2020-11-17 MED ORDER — HEPARIN SOD (PORK) LOCK FLUSH 100 UNIT/ML IV SOLN
500.0000 [IU] | Freq: Once | INTRAVENOUS | Status: AC
  Administered 2020-11-17: 500 [IU] via INTRAVENOUS

## 2020-11-17 MED ORDER — SODIUM CHLORIDE 0.9 % IV SOLN: INTRAVENOUS | Status: AC

## 2020-11-17 MED ORDER — SODIUM CHLORIDE 0.9% FLUSH
10.0000 mL | Freq: Once | INTRAVENOUS | Status: AC
  Administered 2020-11-17: 10 mL via INTRAVENOUS

## 2020-11-17 NOTE — Patient Instructions (Signed)
[facility] Discharge Instructions for Patients Receiving Chemotherapy  Today you received the following chemotherapy agents   To help prevent nausea and vomiting after your treatment, we encourage you to take your nausea medication   If you develop nausea and vomiting that is not controlled by your nausea medication, call the clinic.   BELOW ARE SYMPTOMS THAT SHOULD BE REPORTED IMMEDIATELY:  *FEVER GREATER THAN 100.5 F  *CHILLS WITH OR WITHOUT FEVER  NAUSEA AND VOMITING THAT IS NOT CONTROLLED WITH YOUR NAUSEA MEDICATION  *UNUSUAL SHORTNESS OF BREATH  *UNUSUAL BRUISING OR BLEEDING  TENDERNESS IN MOUTH AND THROAT WITH OR WITHOUT PRESENCE OF ULCERS  *URINARY PROBLEMS  *BOWEL PROBLEMS  UNUSUAL RASH Items with * indicate a potential emergency and should be followed up as soon as possible.  Feel free to call the clinic should you have any questions or concerns. The clinic phone number is [phone].  Please show the CHEMO ALERT CARD at check-in to the Emergency Department and triage nurse.   

## 2020-11-17 NOTE — Patient Instructions (Signed)
[place] at [facility] Discharge Instructions  You were seen today by Dr. Delton Coombes. He went over your recent results. You did not receive your treatment today; you will return in 1 week for labs and follow up for treatment. Drink at least 60 to 70 ounces of water daily to replace the fluid lost to diarrhea. Dr. Delton Coombes will see you back in 3 weeks for labs and follow up.   Thank you for choosing [place] at [facility] to provide your oncology and hematology care.  To afford each patient quality time with our provider, please arrive at least 15 minutes before your scheduled appointment time.   If you have a lab appointment with the [place] please come in thru the Main Entrance and check in at the main information desk  You need to re-schedule your appointment should you arrive 10 or more minutes late.  We strive to give you quality time with our providers, and arriving late affects you and other patients whose appointments are after yours.  Also, if you no show three or more times for appointments you may be dismissed from the clinic at the providers discretion.     Again, thank you for choosing [facility].  Our hope is that these requests will decrease the amount of time that you wait before being seen by our physicians.       _____________________________________________________________  Should you have questions after your visit to [facility], please contact our office at [phone] between the hours of 8:00 a.m. and 4:30 p.m.  Voicemails left after 4:00 p.m. will not be returned until the following business day.  For prescription refill requests, have your pharmacy contact our office and allow 72 hours.    Cancer Center Support Programs:   > Cancer Support Group  2nd Tuesday of the month 1pm-2pm, Journey Room

## 2020-11-17 NOTE — Progress Notes (Signed)
Patient was assessed by Dr. Delton Coombes and labs have been reviewed.  We will hold treatment today due to weakness. Orders received for normal saline 500 ml over 1 hour.  Primary RN and pharmacy aware.

## 2020-11-17 NOTE — Progress Notes (Signed)
Pt here for D1C5 of FOLFOX.  Pt has still been feeling weak since last treatment.  Pt states that she is still eating and drinking. Labs reviewed with Dr Raliegh Ip,  ALK PHOS 154.  Due to pts weakness, holding treatment for 1 week.  540m NS given over 1 hr.  Diarrhea sheet given and explained.   Tolerated fluids well today.  Discharged in stable condition ambulatory with cane.  Pt refused wheelchair.

## 2020-11-17 NOTE — Progress Notes (Signed)
[place], [place] [address]   CLINIC:  Medical Oncology/Hematology  PCP:  Roderic Scarce, MD [address] [place] / [place] [address] [phone]   REASON FOR VISIT:  Follow-up for rectal cancer  PRIOR THERAPY: Transanal resection of polyp in 10/2012  NGS Results: Not done  CURRENT THERAPY: FOLFOX & Aloxi every 2 weeks  BRIEF ONCOLOGIC HISTORY:  Oncology History  Rectal cancer ([place])  08/14/2020 Initial Diagnosis   Rectal cancer ([place]ren[place] Mclean)   09/02/2020 Cancer Staging   Staging form: Colon and Rectum, AJCC 8th Edition - Clinical stage from 09/02/2020: Stage IIA (cT3, cN0, cM0) - Signed by [name], MD on 09/02/2020   09/16/2020 -  Chemotherapy   The patient had palonosetron (ALOXI) injection 0.25 mg, 0.25 mg, Intravenous,  Once, 4 of 8 cycles Administration: 0.25 mg (09/16/2020), 0.25 mg (10/07/2020), 0.25 mg (10/21/2020), 0.25 mg (11/04/2020) pegfilgrastim-cbqv (UDENYCA) injection 6 mg, 6 mg, Subcutaneous, Once, 3 of 7 cycles Administration: 6 mg (10/09/2020), 6 mg (10/23/2020), 6 mg (11/06/2020) leucovorin 700 mg in dextrose 5 % 250 mL infusion, 380 mg/m2 = 736 mg, Intravenous,  Once, 4 of 8 cycles Administration: 700 mg (09/16/2020), 700 mg (10/07/2020), 700 mg (10/21/2020), 700 mg (11/04/2020) oxaliplatin (ELOXATIN) 125 mg in dextrose 5 % 500 mL chemo infusion, 68 mg/m2 = 125 mg (80 % of original dose 85 mg/m2), Intravenous,  Once, 4 of 8 cycles Dose modification: 68 mg/m2 (80 % of original dose 85 mg/m2, Cycle 1, Reason: Provider Judgment), 68 mg/m2 (80 % of original dose 85 mg/m2, Cycle 2, Reason: Provider Judgment), 55 mg/m2 (original dose 85 mg/m2, Cycle 4, Reason: Other (see comments), Comment: neuropathy) Administration: 125 mg (09/16/2020), 125 mg (10/07/2020), 125 mg (10/21/2020), 100 mg (11/04/2020) fluorouracil (ADRUCIL) chemo injection 750 mg, 400 mg/m2 = 750 mg, Intravenous,  Once, 4 of 8 cycles Administration: 750 mg  (09/16/2020), 750 mg (10/07/2020), 750 mg (10/21/2020), 750 mg (11/04/2020) fluorouracil (ADRUCIL) 4,400 mg in sodium chloride 0.9 % 62 mL chemo infusion, 2,400 mg/m2 = 4,400 mg, Intravenous, 1 Day/Dose, 4 of 8 cycles Administration: 4,400 mg (09/16/2020), 4,400 mg (10/07/2020), 4,400 mg (10/21/2020), 4,400 mg (11/04/2020)  for chemotherapy treatment.      CANCER STAGING: Cancer Staging Rectal cancer [facility]) Staging form: Colon and Rectum, AJCC 8th Edition - Clinical stage from 09/02/2020: Stage IIA (cT3, cN0, cM0) - Signed by [name], MD on 09/02/2020   INTERVAL HISTORY:  [name], a 74 y.o. female, returns for routine follow-up and consideration for next cycle of chemotherapy. [name] was last seen on 11/04/2020.  Due for cycle #5 of FOLFOX and Aloxi today.   Overall, she tells me she has been feeling fair. She reports feeling weak since getting her last treatment and has not driven for the past 3 weeks and reports having nausea and alternating watery diarrhea and loose stool 5-6 per day since starting chemo and took a whole tablet Imodium over the weekend which helped slow down the diarrhea, but denies vomiting, numbness or tingling. She has been taking 1/2 tablet Imodium since she is afraid of getting constipated but it did not help slow down the diarrhea. She reports feeling lightheaded when she was coming up to hospital. Her appetite is good. She takes potassium daily. She drinks about 3 cups of water daily.  She feels too weak today and will hold off treatment until next week.   REVIEW OF SYSTEMS:  Review of Systems  Constitutional: Positive  for appetite change (75%) and fatigue (75%).  Gastrointestinal: Positive for diarrhea and nausea. Negative for vomiting.  Neurological: Negative for numbness.  All other systems reviewed and are negative.   PAST MEDICAL/SURGICAL HISTORY:  Past Medical History:  Diagnosis Date  . Arthritis   . COVID-19 03/2019  . GERD  (gastroesophageal reflux disease)   . HLD (hyperlipidemia)   . HTN (hypertension)   . Port-A-Cath in place 09/09/2020  . 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 Sigmoid diverticulitis 3244   Uncomplicated   Past Surgical History:  Procedure Laterality Date  . BIOPSY  07/10/2020   Procedure: BIOPSY;  Surgeon: [name], MD;  Location: [facility] ENDO [facility];  Service: Endoscopy;;  . CHOLECYSTECTOMY  1998  . COLON SURGERY  10/30/2012   Dr. Audrie Lia; transanal resection of 2 cm rectal polyp; pathology revealed superficially invasive moderately differentiated adenocarcinoma arising in adenomatous polyp of distal rectum.  . COLONOSCOPY  10/24/2013   Dr. West Carbo; with propofol; small granuloma and sutures distal rectum, small polyp mid rectum removed, sigmoid diverticulosis, otherwise normal exam.  No pathology received.  Recommended colonoscopy in 5 years.  . COLONOSCOPY  09/08/2012   Dr. West Carbo; with propofol; multi lobulated friable 3 cm mass distal rectum s/p multiple biopsies, scattered sigmoid diverticulosis, otherwise normal exam.  Pathology with adenomatous polyp.  . COLONOSCOPY WITH PROPOFOL N/A 07/10/2020   Procedure: COLONOSCOPY WITH PROPOFOL;  Surgeon: [name], MD;  Location: [facility] ENDO SU[facility];  Service: Endoscopy;  Laterality: N/A;  10:00am  . POLYPECTOMY  07/10/2020   Procedure: POLYPECTOMY;  Surgeon: [name], MD;  Location: [facility] END[facility];  Service: Endoscopy;;  . PORTACATH PLACEMENT Left 09/09/2020   Procedure: INSERTION PORT-A-CATH;  Surgeon: Aviva Signs, MD;  Location: [facility] ORS;  Service: General;  Laterality: Left;  . REPLACEMENT TOTAL HIP W/  RESURFACING IMPLANTS Bilateral   . 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 REPLACEMENT TOTAL KNEE Right 12/03/2014  . TOTAL SHOULDER ARTHROPLASTY  04/26/2018    SOCIAL HISTORY:  Social History   Socioeconomic History  . Marital status: Married    Spouse name: Not on file  . Number of children: 2  . Years of education:  Not on file  . Highest education level: Not on file  Occupational History    Employer: retired  Tobacco Use  . Smoking status: Never Smoker  . Smokeless tobacco: Never Used  Vaping Use  . Vaping Use: Never used  Substance and Sexual Activity  . Alcohol use: Never  . Drug use: Never  . Sexual activity: Not Currently  Other Topics Concern  . Not on file  Social History Narrative  . Not on file   Social Determinants of Health   Financial Resource Strain: Low Risk   . Difficulty of Paying Living Expenses: Not hard at all  Food Insecurity: No Food Insecurity  . Worried About Charity fundraiser in the Last Year: Never true  . Ran Out of Food in the Last Year: Never true  Transportation Needs: No Transportation Needs  . Lack of Transportation (Medical): No  . Lack of Transportation (Non-Medical): No  Physical Activity: Inactive  . Days of Exercise per Week: 0 days  . Minutes of Exercise per Session: 0 min  Stress: No Stress Concern Present  . Feeling of Stress : Only a little  Social Connections: Moderately Integrated  . Frequency of Communication with Friends and Family: More than three times a week  . Frequency of Social Gatherings with Friends  and Family: More than three times a week  . Attends Religious Services: More than 4 times per year  . Active Member of Clubs or Organizations: No  . Attends Archivist Meetings: Never  . Marital Status: Married  Human resources officer Violence: Not At Risk  . Fear of Current or Ex-Partner: No  . Emotionally Abused: No  . Physically Abused: No  . Sexually Abused: No    FAMILY HISTORY:  Family History  Problem Relation Age of Onset  . Arthritis Mother   . High blood pressure Mother   . Heart disease Father   . Breast cancer Maternal Aunt   . Stroke Maternal Grandmother   . Cancer Paternal Grandmother   . Diabetes Brother   . Colon cancer Neg Hx     CURRENT MEDICATIONS:  Current Outpatient Medications  Medication Sig  Dispense Refill  . celecoxib (CELEBREX) 200 MG capsule Take 200 mg by mouth daily.    Marland Kitchen dexamethasone 10 mg in sodium chloride 0.9 % 50 mL Inject 10 mg into the vein every 14 (fourteen) days. Prior to chemotherapy administration    . feeding supplement (ENSURE ENLIVE / ENSURE PLUS) LIQD Take 237 mLs by mouth 2 (two) times daily between meals. 237 mL 12  . fluorouracil CALGB 03500 in sodium chloride 0.9 % 150 mL Inject 2,400 mg/m2 into the vein over 48 hr.    . FLUOROURACIL IV Inject 400 mg/m2 into the vein every 14 (fourteen) days.    Marland Kitchen LEUCOVORIN CALCIUM IV Inject 400 mg/m2 into the vein every 14 (fourteen) days.    . Multiple Vitamin (MULTIVITAMIN WITH MINERALS) TABS tablet Take 1 tablet by mouth daily.    Marland Kitchen omega-3 acid ethyl esters (LOVAZA) 1 g capsule Take 2 g by mouth daily.    Marland Kitchen omeprazole (PRILOSEC) 20 MG capsule Take 20 mg by mouth daily.    . OXALIPLATIN IV Inject 85 mg/m2 into the vein every 14 (fourteen) days.    . palonosetron (ALOXI) 0.25 MG/5ML injection Inject 0.25 mg into the vein every 14 (fourteen) days. Prior to chemotherapy administration    . potassium chloride SA (KLOR-CON) 20 MEQ tablet Take 1 tablet (20 mEq total) by mouth daily. 30 tablet 2  . simvastatin (ZOCOR) 40 MG tablet Take 40 mg by mouth at bedtime.     . traMADol (ULTRAM) 50 MG tablet Take 1 tablet (50 mg total) by mouth every 6 (six) hours as needed. 20 tablet 0  . Turmeric 500 MG TABS Take 500 mg by mouth daily.    . valsartan (DIOVAN) 320 MG tablet Take 320 mg by mouth daily.    Marland Kitchen lidocaine-prilocaine (EMLA) cream Apply a small amount to port a cath site and cover with plastic wrap 1 hour prior to chemotherapy appointments (Patient not taking: Reported on 11/17/2020) 30 g 3  . prochlorperazine (COMPAZINE) 10 MG tablet Take 1 tablet (10 mg total) by mouth every 6 (six) hours as needed (Nausea or vomiting). (Patient not taking: Reported on 11/17/2020) 30 tablet 1   No current facility-administered medications  for this visit.   Facility-Administered Medications Ordered in Other Visits  Medication Dose Route Frequency Provider Last Rate Last Admin  . 0.9 %  sodium chloride infusion   Intravenous Continuous [name], MD      . heparin lock flush 100 unit/mL  500 Units Intravenous Once [name], MD      . sodium chloride flush (NS) 0.9 % injection 10 mL  10 mL Intravenous  Once [name], MD        ALLERGIES:  No Known Allergies  PHYSICAL EXAM:  Performance status (ECOG): 0 - Asymptomatic  Vitals:   11/17/20 0850  BP: (!) 143/62  Pulse: 93  Resp: 17  Temp: (!) 96.9 F (36.1 C)  SpO2: 100%   Wt Readings from Last 3 Encounters:  11/17/20 168 lb 14.4 oz (76.6 kg)  11/11/20 170 lb (77.1 kg)  11/04/20 172 lb 12.8 oz (78.4 kg)   Physical Exam Vitals reviewed.  Constitutional:      Appearance: Normal appearance. She is obese.  Cardiovascular:     Rate and Rhythm: Normal rate and regular rhythm.     Pulses: Normal pulses.     Heart sounds: Normal heart sounds.  Pulmonary:     Effort: Pulmonary effort is normal.     Breath sounds: Normal breath sounds.  Chest:     Comments: Port-a-Cath in L chest Musculoskeletal:     Right lower leg: No edema.     Left lower leg: No edema.  Neurological:     General: No focal deficit present.     Mental Status: She is alert and oriented to person, place, and time.  Psychiatric:        Mood and Affect: Mood normal.        Behavior: Behavior normal.     LABORATORY DATA:   I have reviewed the labs as listed.  CBC Latest Ref Rng & Units 11/17/2020 11/11/2020 11/04/2020  WBC 4.0 - 10.5 K/uL PENDING 2.4(L) 25.3(H)  Hemoglobin 12.0 - 15.0 g/dL 9.1(L) 8.6(L) 9.5(L)  Hematocrit 36 - 46 % 28.3(L) 26.7(L) 29.4(L)  Platelets 150 - 400 K/uL 160 55(L) 163   CMP Latest Ref Rng & Units 11/17/2020 11/11/2020 11/04/2020  Glucose 70 - 99 mg/dL 119(H) 111(H) 101(H)  BUN 8 - 23 mg/dL 27(H) 31(H) 26(H)  Creatinine 0.44 - 1.00  mg/dL 1.05(H) 0.96 0.99  Sodium 135 - 145 mmol/L 137 138 141  Potassium 3.5 - 5.1 mmol/L 3.5 4.6 3.1(L)  Chloride 98 - 111 mmol/L 111 106 107  CO2 22 - 32 mmol/L 19(L) 23 24  Calcium 8.9 - 10.3 mg/dL 9.1 9.3 9.3  Total Protein 6.5 - 8.1 g/dL 6.9 6.7 6.7  Total Bilirubin 0.3 - 1.2 mg/dL 0.1(L) 0.9 0.4  Alkaline Phos 38 - 126 U/L 154(H) 199(H) 146(H)  AST 15 - 41 U/L 26 31 37  ALT 0 - 44 U/L 21 18 30     DIAGNOSTIC IMAGING:  I have independently reviewed the scans and discussed with the patient. No results found.   ASSESSMENT:  1. Rectal adenocarcinoma: -History of rectal polyp with adenocarcinoma, status post transanal resection of the polyp in November 2013. -Colonoscopy on July 10, 2020 shows 2.5 cm centrally depressed ulcerated lesion with heaped up circular margin, 8 cm from anal verge. Distally, just above the anal verge, suture line consistent with history of prior transanal polypectomy. -Pathology of the rectal mass consistent with adenocarcinoma. Splenic flexure polypectomy was tubular adenoma. Sigmoid colon biopsy was benign. -CT CAP with contrast on August 05, 2020 shows no evidence of neoplastic disease within the chest, abdomen or pelvis. Complicated left midpole renal cyst versus complex cystic mass. No lung masses. -Pelvis MRI on 08/27/2020 showed T3BN0 tumor. Distance of tumor/node from mesorectal fascia was 1 cm. Distance from tumor to the internal anal sphincter was 4.2 cm. Tumor size is 2.2 x 2.2 cm. -Total neoadjuvant therapy with 4 months of FOLFOX followed by  long course chemo RT with Xeloda followed by restaging pelvis MRI 6 to 8 weeks after completion of XRT followed by resection recommended. -Cycle 1 of FOLFOX on 09/16/2020  2. Social/family history: -She lives at home with her husband. She worked as a Counselling psychologist for 47 years. No history of smoking. -Maternal aunt had breast cancer.   PLAN:  1.Stage II (T3N0) rectal adenocarcinoma: -She had a lot  of fatigue after last cycle. -She also had quite a bit of diarrhea. -We have reviewed her labs today.  Because of her severe weakness, I will hold her treatment today. -She will receive half liter of normal saline today as her creatinine increased to 1.05. -We will reevaluate her next week.  Will consider decreasing the dose of 5-FU for better tolerability.  2. Left kidney lesion: -Dr. Alyson Ingles recommended MRI in 6 months.  She wants to delay the MRI.  3. Hypokalemia:  -Continue potassium 20 mEq daily.  Potassium today is 3.5.  4.  Diarrhea: -Her baseline bowel movements are 3 times per day.  She had up to 5 loose to watery bowel movements after last cycle. -She was taking Imodium half tablet as needed as she is concerned that she would get constipated. -I have told her to increase it to 1 tablet as needed.   Orders placed this encounter:  No orders of the defined types were placed in this encounter.    [name], MD [place] [phone]   I, Milinda Antis, am acting as a scribe for Dr. Sanda Linger.  I, [name] MD, have reviewed the above documentation for accuracy and completeness, and I agree with the above.

## 2020-11-19 ENCOUNTER — Encounter (HOSPITAL_COMMUNITY): Payer: Medicare Other

## 2020-11-26 ENCOUNTER — Ambulatory Visit (HOSPITAL_COMMUNITY): Payer: Medicare Other | Admitting: Hematology

## 2020-11-26 ENCOUNTER — Other Ambulatory Visit: Payer: Self-pay

## 2020-11-26 ENCOUNTER — Encounter (HOSPITAL_COMMUNITY): Payer: Self-pay

## 2020-11-26 ENCOUNTER — Inpatient Hospital Stay (HOSPITAL_COMMUNITY): Payer: Medicare Other | Attending: Hematology

## 2020-11-26 ENCOUNTER — Inpatient Hospital Stay (HOSPITAL_COMMUNITY): Payer: Medicare Other

## 2020-11-26 VITALS — BP 109/56 | HR 62 | Temp 97.7°F | Resp 18

## 2020-11-26 DIAGNOSIS — Z95828 Presence of other vascular implants and grafts: Secondary | ICD-10-CM

## 2020-11-26 DIAGNOSIS — R197 Diarrhea, unspecified: Secondary | ICD-10-CM | POA: Diagnosis not present

## 2020-11-26 DIAGNOSIS — C20 Malignant neoplasm of rectum: Secondary | ICD-10-CM

## 2020-11-26 DIAGNOSIS — Z452 Encounter for adjustment and management of vascular access device: Secondary | ICD-10-CM | POA: Diagnosis not present

## 2020-11-26 DIAGNOSIS — E876 Hypokalemia: Secondary | ICD-10-CM | POA: Diagnosis not present

## 2020-11-26 DIAGNOSIS — Z5111 Encounter for antineoplastic chemotherapy: Secondary | ICD-10-CM | POA: Insufficient documentation

## 2020-11-26 LAB — COMPREHENSIVE METABOLIC PANEL
ALT: 23 U/L (ref 0–44)
AST: 26 U/L (ref 15–41)
Albumin: 3.8 g/dL (ref 3.5–5.0)
Alkaline Phosphatase: 98 U/L (ref 38–126)
Anion gap: 10 (ref 5–15)
BUN: 19 mg/dL (ref 8–23)
CO2: 24 mmol/L (ref 22–32)
Calcium: 9 mg/dL (ref 8.9–10.3)
Chloride: 105 mmol/L (ref 98–111)
Creatinine, Ser: 1.03 mg/dL — ABNORMAL HIGH (ref 0.44–1.00)
GFR, Estimated: 56 mL/min — ABNORMAL LOW (ref >60)
Glucose, Bld: 105 mg/dL — ABNORMAL HIGH (ref 70–99)
Potassium: 4 mmol/L (ref 3.5–5.1)
Sodium: 139 mmol/L (ref 135–145)
Total Bilirubin: 0.7 mg/dL (ref 0.3–1.2)
Total Protein: 6.5 g/dL (ref 6.5–8.1)

## 2020-11-26 LAB — CBC WITH DIFFERENTIAL/PLATELET
Abs Immature Granulocytes: 0.14 10*3/uL — ABNORMAL HIGH (ref 0.00–0.07)
Basophils Absolute: 0.1 10*3/uL (ref 0.0–0.1)
Basophils Relative: 1 %
Eosinophils Absolute: 0.2 10*3/uL (ref 0.0–0.5)
Eosinophils Relative: 3 %
HCT: 32.2 % — ABNORMAL LOW (ref 36.0–46.0)
Hemoglobin: 10 g/dL — ABNORMAL LOW (ref 12.0–15.0)
Immature Granulocytes: 2 %
Lymphocytes Relative: 17 %
Lymphs Abs: 1.2 10*3/uL (ref 0.7–4.0)
MCH: 32.1 pg (ref 26.0–34.0)
MCHC: 31.1 g/dL (ref 30.0–36.0)
MCV: 103.2 fL — ABNORMAL HIGH (ref 80.0–100.0)
Monocytes Absolute: 0.8 10*3/uL (ref 0.1–1.0)
Monocytes Relative: 12 %
Neutro Abs: 4.7 10*3/uL (ref 1.7–7.7)
Neutrophils Relative %: 65 %
Platelets: 248 10*3/uL (ref 150–400)
RBC: 3.12 MIL/uL — ABNORMAL LOW (ref 3.87–5.11)
RDW: 23.9 % — ABNORMAL HIGH (ref 11.5–15.5)
WBC: 7.1 10*3/uL (ref 4.0–10.5)
nRBC: 0.3 % — ABNORMAL HIGH (ref 0.0–0.2)

## 2020-11-26 MED ORDER — FLUOROURACIL CHEMO INJECTION 2.5 GM/50ML
400.0000 mg/m2 | Freq: Once | INTRAVENOUS | Status: AC
  Administered 2020-11-26: 750 mg via INTRAVENOUS
  Filled 2020-11-26: qty 15

## 2020-11-26 MED ORDER — OXALIPLATIN CHEMO INJECTION 100 MG/20ML
55.0000 mg/m2 | Freq: Once | INTRAVENOUS | Status: AC
  Administered 2020-11-26: 100 mg via INTRAVENOUS
  Filled 2020-11-26: qty 20

## 2020-11-26 MED ORDER — DEXTROSE 5 % IV SOLN: Freq: Once | INTRAVENOUS | Status: AC

## 2020-11-26 MED ORDER — LEUCOVORIN CALCIUM INJECTION 350 MG
380.0000 mg/m2 | Freq: Once | INTRAVENOUS | Status: AC
  Administered 2020-11-26: 700 mg via INTRAVENOUS
  Filled 2020-11-26: qty 35

## 2020-11-26 MED ORDER — PALONOSETRON HCL INJECTION 0.25 MG/5ML
0.2500 mg | Freq: Once | INTRAVENOUS | Status: AC
  Administered 2020-11-26: 0.25 mg via INTRAVENOUS
  Filled 2020-11-26: qty 5

## 2020-11-26 MED ORDER — SODIUM CHLORIDE 0.9 % IV SOLN
1920.0000 mg/m2 | INTRAVENOUS | Status: AC
  Administered 2020-11-26: 3550 mg via INTRAVENOUS
  Filled 2020-11-26: qty 71

## 2020-11-26 MED ORDER — SODIUM CHLORIDE 0.9% FLUSH
10.0000 mL | INTRAVENOUS | Status: DC | PRN
  Administered 2020-11-26: 10 mL

## 2020-11-26 MED ORDER — SODIUM CHLORIDE 0.9 % IV SOLN
10.0000 mg | Freq: Once | INTRAVENOUS | Status: AC
  Administered 2020-11-26: 10 mg via INTRAVENOUS
  Filled 2020-11-26: qty 10

## 2020-11-26 MED ORDER — HEPARIN SOD (PORK) LOCK FLUSH 100 UNIT/ML IV SOLN: 500.0000 [IU] | Freq: Once | INTRAVENOUS | Status: DC | PRN

## 2020-11-26 NOTE — Patient Instructions (Signed)
[facility] Discharge Instructions for Patients Receiving Chemotherapy  Today you received the following chemotherapy agents oxaliplatin, 5FU and leucovorin.   To help prevent nausea and vomiting after your treatment, we encourage you to take your nausea medications as directed.     If you develop nausea and vomiting that is not controlled by your nausea medication, call the clinic.   BELOW ARE SYMPTOMS THAT SHOULD BE REPORTED IMMEDIATELY:  *FEVER GREATER THAN 100.5 F  *CHILLS WITH OR WITHOUT FEVER  NAUSEA AND VOMITING THAT IS NOT CONTROLLED WITH YOUR NAUSEA MEDICATION  *UNUSUAL SHORTNESS OF BREATH  *UNUSUAL BRUISING OR BLEEDING  TENDERNESS IN MOUTH AND THROAT WITH OR WITHOUT PRESENCE OF ULCERS  *URINARY PROBLEMS  *BOWEL PROBLEMS  UNUSUAL RASH Items with * indicate a potential emergency and should be followed up as soon as possible.  Feel free to call the clinic should you have any questions or concerns. The clinic phone number is [phone].  Please show the [place] at check-in to the Emergency Department and triage nurse.

## 2020-11-26 NOTE — Progress Notes (Signed)
Patient tolerated chemotherapy with no complaints voiced.  Side effects with management reviewed with understanding verbalized.  Port site clean and dry with no bruising or swelling noted at site.  Good blood return noted before and after administration of chemotherapy.  Chemotherapy pump connected with no alarms noted.   Patient left in satisfactory condition with VSS and no s/s of distress noted.  

## 2020-11-26 NOTE — Addendum Note (Signed)
Addended by: Charlyne Petrin B on: 11/26/2020 09:39 AM   Modules accepted: Orders

## 2020-11-26 NOTE — Addendum Note (Signed)
Addended by: [name] on: 11/26/2020 09:34 AM   Modules accepted: Orders

## 2020-11-28 ENCOUNTER — Inpatient Hospital Stay (HOSPITAL_COMMUNITY): Payer: Medicare Other

## 2020-11-28 ENCOUNTER — Other Ambulatory Visit: Payer: Self-pay

## 2020-11-28 VITALS — BP 123/75 | HR 76 | Temp 97.3°F | Resp 18

## 2020-11-28 DIAGNOSIS — Z5111 Encounter for antineoplastic chemotherapy: Secondary | ICD-10-CM | POA: Diagnosis not present

## 2020-11-28 DIAGNOSIS — Z95828 Presence of other vascular implants and grafts: Secondary | ICD-10-CM

## 2020-11-28 MED ORDER — HEPARIN SOD (PORK) LOCK FLUSH 100 UNIT/ML IV SOLN
500.0000 [IU] | Freq: Once | INTRAVENOUS | Status: AC
  Administered 2020-11-28: 500 [IU] via INTRAVENOUS

## 2020-11-28 MED ORDER — SODIUM CHLORIDE 0.9% FLUSH
10.0000 mL | INTRAVENOUS | Status: DC | PRN
  Administered 2020-11-28: 10 mL via INTRAVENOUS

## 2020-12-10 ENCOUNTER — Inpatient Hospital Stay (HOSPITAL_COMMUNITY): Payer: Medicare Other

## 2020-12-10 ENCOUNTER — Inpatient Hospital Stay (HOSPITAL_BASED_OUTPATIENT_CLINIC_OR_DEPARTMENT_OTHER): Payer: Medicare Other | Admitting: Oncology

## 2020-12-10 ENCOUNTER — Other Ambulatory Visit: Payer: Self-pay

## 2020-12-10 VITALS — BP 110/60 | HR 65 | Temp 97.6°F | Resp 18

## 2020-12-10 VITALS — BP 152/70 | HR 79 | Temp 97.0°F | Resp 18 | Wt 172.4 lb

## 2020-12-10 DIAGNOSIS — Z95828 Presence of other vascular implants and grafts: Secondary | ICD-10-CM

## 2020-12-10 DIAGNOSIS — Z5111 Encounter for antineoplastic chemotherapy: Secondary | ICD-10-CM | POA: Diagnosis not present

## 2020-12-10 DIAGNOSIS — C20 Malignant neoplasm of rectum: Secondary | ICD-10-CM

## 2020-12-10 LAB — COMPREHENSIVE METABOLIC PANEL
ALT: 19 U/L (ref 0–44)
AST: 23 U/L (ref 15–41)
Albumin: 3.8 g/dL (ref 3.5–5.0)
Alkaline Phosphatase: 79 U/L (ref 38–126)
Anion gap: 10 (ref 5–15)
BUN: 21 mg/dL (ref 8–23)
CO2: 22 mmol/L (ref 22–32)
Calcium: 9.1 mg/dL (ref 8.9–10.3)
Chloride: 107 mmol/L (ref 98–111)
Creatinine, Ser: 0.97 mg/dL (ref 0.44–1.00)
GFR, Estimated: >60 mL/min (ref >60)
Glucose, Bld: 93 mg/dL (ref 70–99)
Potassium: 3.8 mmol/L (ref 3.5–5.1)
Sodium: 139 mmol/L (ref 135–145)
Total Bilirubin: 0.6 mg/dL (ref 0.3–1.2)
Total Protein: 6.6 g/dL (ref 6.5–8.1)

## 2020-12-10 LAB — CBC WITH DIFFERENTIAL/PLATELET
Abs Immature Granulocytes: 0.01 10*3/uL (ref 0.00–0.07)
Basophils Absolute: 0 10*3/uL (ref 0.0–0.1)
Basophils Relative: 1 %
Eosinophils Absolute: 0.1 10*3/uL (ref 0.0–0.5)
Eosinophils Relative: 4 %
HCT: 28.8 % — ABNORMAL LOW (ref 36.0–46.0)
Hemoglobin: 9.5 g/dL — ABNORMAL LOW (ref 12.0–15.0)
Immature Granulocytes: 0 %
Lymphocytes Relative: 28 %
Lymphs Abs: 0.9 10*3/uL (ref 0.7–4.0)
MCH: 34.1 pg — ABNORMAL HIGH (ref 26.0–34.0)
MCHC: 33 g/dL (ref 30.0–36.0)
MCV: 103.2 fL — ABNORMAL HIGH (ref 80.0–100.0)
Monocytes Absolute: 0.3 10*3/uL (ref 0.1–1.0)
Monocytes Relative: 9 %
Neutro Abs: 1.9 10*3/uL (ref 1.7–7.7)
Neutrophils Relative %: 58 %
Platelets: 120 10*3/uL — ABNORMAL LOW (ref 150–400)
RBC: 2.79 MIL/uL — ABNORMAL LOW (ref 3.87–5.11)
RDW: 21.6 % — ABNORMAL HIGH (ref 11.5–15.5)
WBC: 3.4 10*3/uL — ABNORMAL LOW (ref 4.0–10.5)
nRBC: 0 % (ref 0.0–0.2)

## 2020-12-10 MED ORDER — SODIUM CHLORIDE 0.9 % IV SOLN
1800.0000 mg/m2 | INTRAVENOUS | Status: DC
  Administered 2020-12-10: 3300 mg via INTRAVENOUS
  Filled 2020-12-10: qty 66

## 2020-12-10 MED ORDER — DEXTROSE 5 % IV SOLN: Freq: Once | INTRAVENOUS | Status: AC

## 2020-12-10 MED ORDER — LEUCOVORIN CALCIUM INJECTION 350 MG
300.0000 mg/m2 | Freq: Once | INTRAVENOUS | Status: AC
  Administered 2020-12-10: 552 mg via INTRAVENOUS
  Filled 2020-12-10: qty 27.6

## 2020-12-10 MED ORDER — FLUOROURACIL CHEMO INJECTION 2.5 GM/50ML
300.0000 mg/m2 | Freq: Once | INTRAVENOUS | Status: AC
  Administered 2020-12-10: 550 mg via INTRAVENOUS
  Filled 2020-12-10: qty 11

## 2020-12-10 MED ORDER — SODIUM CHLORIDE 0.9% FLUSH: 10.0000 mL | INTRAVENOUS | Status: DC | PRN

## 2020-12-10 MED ORDER — DEXTROSE 5 % IV SOLN
Freq: Once | INTRAVENOUS | Status: AC
Start: 2020-12-10 — End: 2020-12-10

## 2020-12-10 MED ORDER — HEPARIN SOD (PORK) LOCK FLUSH 100 UNIT/ML IV SOLN: 500.0000 [IU] | Freq: Once | INTRAVENOUS | Status: DC | PRN

## 2020-12-10 MED ORDER — PALONOSETRON HCL INJECTION 0.25 MG/5ML
0.2500 mg | Freq: Once | INTRAVENOUS | Status: AC
  Administered 2020-12-10: 0.25 mg via INTRAVENOUS
  Filled 2020-12-10: qty 5

## 2020-12-10 MED ORDER — SODIUM CHLORIDE 0.9 % IV SOLN
10.0000 mg | Freq: Once | INTRAVENOUS | Status: AC
  Administered 2020-12-10: 10 mg via INTRAVENOUS
  Filled 2020-12-10: qty 10

## 2020-12-10 MED ORDER — OXALIPLATIN CHEMO INJECTION 100 MG/20ML
55.0000 mg/m2 | Freq: Once | INTRAVENOUS | Status: AC
  Administered 2020-12-10: 100 mg via INTRAVENOUS
  Filled 2020-12-10: qty 20

## 2020-12-10 NOTE — Progress Notes (Signed)
Patients port flushed without difficulty.  Good blood return noted with no bruising or swelling noted at site.  Transparent dressing applied.  Patient left accessed for chemotherapy treatment. 

## 2020-12-10 NOTE — Progress Notes (Signed)
Pt here for D1C6 of FOLFOX.  Labs and VS are WNL for treatment today.  Okay for treatment, 5FU dose reduced further this cycle.  Toelrated treatment well today without incidence.  Vital signs stable prior to discharge. Discharged in stable condition with ambulatory pump, ambulatory with cane.

## 2020-12-10 NOTE — Patient Instructions (Signed)
[facility] Discharge Instructions for Patients Receiving Chemotherapy  Today you received the following chemotherapy agents   To help prevent nausea and vomiting after your treatment, we encourage you to take your nausea medication   If you develop nausea and vomiting that is not controlled by your nausea medication, call the clinic.   BELOW ARE SYMPTOMS THAT SHOULD BE REPORTED IMMEDIATELY:  *FEVER GREATER THAN 100.5 F  *CHILLS WITH OR WITHOUT FEVER  NAUSEA AND VOMITING THAT IS NOT CONTROLLED WITH YOUR NAUSEA MEDICATION  *UNUSUAL SHORTNESS OF BREATH  *UNUSUAL BRUISING OR BLEEDING  TENDERNESS IN MOUTH AND THROAT WITH OR WITHOUT PRESENCE OF ULCERS  *URINARY PROBLEMS  *BOWEL PROBLEMS  UNUSUAL RASH Items with * indicate a potential emergency and should be followed up as soon as possible.  Feel free to call the clinic should you have any questions or concerns. The clinic phone number is [phone].  Please show the CHEMO ALERT CARD at check-in to the Emergency Department and triage nurse.   

## 2020-12-10 NOTE — Progress Notes (Signed)
Brenda Mclean   Diagnosis: Rectal cancer  INTERVAL HISTORY:   Brenda Mclean returns for a scheduled visit.  She completed cycle 5 of neoadjuvant FOLFOX on 11/26/2020.  She reports mild cold sensitivity following chemotherapy.  No peripheral numbness at present.  She has noted erythema at the fingers and peeling of the skin at the hands and feet.  No diarrhea.  She has intermittent nausea, improved with Compazine.  Objective:  Vital signs in last 24 hours:  Blood pressure (!) 152/70, pulse 79, temperature (!) 97 F (36.1 C), temperature source Temporal, resp. rate 18, weight 172 lb 6.4 oz (78.2 kg), SpO2 99 %.    HEENT: No thrush or ulcers Resp: Lungs clear bilaterally Cardio: Regular rate and rhythm GI: No hepatosplenomegaly, nontender Vascular: No leg edema  Skin: Erythema at the distal fingers with superficial desquamation, skin thickening and superficial desquamation at the soles  Portacath/PICC-without erythema  Lab Results:  Lab Results  Component Value Date   WBC 3.4 (L) 12/10/2020   HGB 9.5 (L) 12/10/2020   HCT 28.8 (L) 12/10/2020   MCV 103.2 (H) 12/10/2020   PLT 120 (L) 12/10/2020   NEUTROABS 1.9 12/10/2020    CMP  Lab Results  Component Value Date   NA 139 12/10/2020   K 3.8 12/10/2020   CL 107 12/10/2020   CO2 22 12/10/2020   GLUCOSE 93 12/10/2020   BUN 21 12/10/2020   CREATININE 0.97 12/10/2020   CALCIUM 9.1 12/10/2020   PROT 6.6 12/10/2020   ALBUMIN 3.8 12/10/2020   AST 23 12/10/2020   ALT 19 12/10/2020   ALKPHOS 79 12/10/2020   BILITOT 0.6 12/10/2020   GFRNONAA >60 12/10/2020   GFRAA >60 09/16/2020    Lab Results  Component Value Date   CEA1 2.7 08/18/2020    Medications: I have reviewed the patient's current medications.   Assessment/Plan: 1. Rectal adenocarcinoma -History of rectal polyp with adenocarcinoma, status post transanal resection of the polyp in November 2013. -Colonoscopy on July 10, 2020 shows 2.5 cm centrally depressed ulcerated lesion with heaped up circular margin, 8 cm from anal verge. Distally, just above the anal verge, suture line consistent with history of prior transanal polypectomy. -Pathology of the rectal mass consistent with adenocarcinoma. Splenic flexure polypectomy was tubular adenoma. Sigmoid colon biopsy was benign. -CT CAP with contrast on August 05, 2020 shows no evidence of neoplastic disease within the chest, abdomen or pelvis. Complicated left midpole renal cyst versus complex cystic mass. No lung masses. -Pelvis MRI on 08/27/2020 showed T3BN0 tumor. Distance of tumor/node from mesorectal fascia was 1 cm. Distance from tumor to the internal anal sphincter was 4.2 cm. Tumor size is 2.2 x 2.2 cm. -Total neoadjuvant therapy with 4 months of FOLFOX followed by long course chemo RT with Xeloda followed by restaging pelvis MRI 6 to 8 weeks after completion of XRT followed by resection recommended. -Cycle 1 of FOLFOX on 09/16/2020  2. Social/family history: -She lives at home with her husband. She worked as a Counselling psychologist for 47 years. No history of smoking. -Maternal aunt had breast cancer.  3.  Left renal lesion-followed by Dr. Alyson Ingles  4.  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  She has completed 5 cycles of FOLFOX.  She has mild hand/foot syndrome.  The plan is to complete cycle 6 FOLFOX today.  5-FU will be further dose reduced with this cycle.  Brenda Mclean will return for an office visit and cycle 7 FOLFOX in 2 weeks.  She will use moisturizers on the hands and feet.  She will discontinue the multivitamin while on 5-fluorouracil.  Betsy Coder, MD  12/10/2020  10:02 AM

## 2020-12-10 NOTE — Addendum Note (Signed)
Addended by: Candis Musa on: 12/10/2020 11:17 AM   Modules accepted: Orders

## 2020-12-12 ENCOUNTER — Other Ambulatory Visit: Payer: Self-pay

## 2020-12-12 ENCOUNTER — Inpatient Hospital Stay (HOSPITAL_COMMUNITY): Payer: Medicare Other

## 2020-12-12 VITALS — BP 141/58 | HR 69 | Temp 97.6°F | Resp 20

## 2020-12-12 DIAGNOSIS — C20 Malignant neoplasm of rectum: Secondary | ICD-10-CM

## 2020-12-12 DIAGNOSIS — Z5111 Encounter for antineoplastic chemotherapy: Secondary | ICD-10-CM | POA: Diagnosis not present

## 2020-12-12 MED ORDER — HEPARIN SOD (PORK) LOCK FLUSH 100 UNIT/ML IV SOLN
500.0000 [IU] | Freq: Once | INTRAVENOUS | Status: AC
  Administered 2020-12-12: 500 [IU] via INTRAVENOUS

## 2020-12-12 MED ORDER — SODIUM CHLORIDE 0.9% FLUSH
10.0000 mL | Freq: Once | INTRAVENOUS | Status: AC
  Administered 2020-12-12: 10 mL via INTRAVENOUS

## 2020-12-12 NOTE — Progress Notes (Signed)
Patients port flushed without difficulty.  Good blood return noted with no bruising or swelling noted at site.  Band aid applied.  VSS with discharge and left in satisfactory condition with no s/s of distress noted.   

## 2020-12-23 ENCOUNTER — Inpatient Hospital Stay (HOSPITAL_BASED_OUTPATIENT_CLINIC_OR_DEPARTMENT_OTHER): Payer: Medicare Other | Admitting: Hematology

## 2020-12-23 ENCOUNTER — Inpatient Hospital Stay (HOSPITAL_COMMUNITY): Payer: Medicare Other

## 2020-12-23 ENCOUNTER — Other Ambulatory Visit: Payer: Self-pay

## 2020-12-23 VITALS — BP 135/79 | HR 77 | Temp 96.8°F | Resp 18 | Wt 167.5 lb

## 2020-12-23 DIAGNOSIS — C20 Malignant neoplasm of rectum: Secondary | ICD-10-CM

## 2020-12-23 DIAGNOSIS — Z5111 Encounter for antineoplastic chemotherapy: Secondary | ICD-10-CM | POA: Diagnosis not present

## 2020-12-23 LAB — COMPREHENSIVE METABOLIC PANEL
ALT: 27 U/L (ref 0–44)
AST: 31 U/L (ref 15–41)
Albumin: 3.9 g/dL (ref 3.5–5.0)
Alkaline Phosphatase: 90 U/L (ref 38–126)
Anion gap: 11 (ref 5–15)
BUN: 13 mg/dL (ref 8–23)
CO2: 23 mmol/L (ref 22–32)
Calcium: 8.9 mg/dL (ref 8.9–10.3)
Chloride: 106 mmol/L (ref 98–111)
Creatinine, Ser: 0.89 mg/dL (ref 0.44–1.00)
GFR, Estimated: >60 mL/min (ref >60)
Glucose, Bld: 105 mg/dL — ABNORMAL HIGH (ref 70–99)
Potassium: 2.7 mmol/L — CL (ref 3.5–5.1)
Sodium: 140 mmol/L (ref 135–145)
Total Bilirubin: 0.8 mg/dL (ref 0.3–1.2)
Total Protein: 6.7 g/dL (ref 6.5–8.1)

## 2020-12-23 LAB — CBC WITH DIFFERENTIAL/PLATELET
Abs Immature Granulocytes: 0 10*3/uL (ref 0.00–0.07)
Basophils Absolute: 0 10*3/uL (ref 0.0–0.1)
Basophils Relative: 2 %
Eosinophils Absolute: 0.1 10*3/uL (ref 0.0–0.5)
Eosinophils Relative: 7 %
HCT: 31.2 % — ABNORMAL LOW (ref 36.0–46.0)
Hemoglobin: 10.3 g/dL — ABNORMAL LOW (ref 12.0–15.0)
Immature Granulocytes: 0 %
Lymphocytes Relative: 39 %
Lymphs Abs: 0.8 10*3/uL (ref 0.7–4.0)
MCH: 34.1 pg — ABNORMAL HIGH (ref 26.0–34.0)
MCHC: 33 g/dL (ref 30.0–36.0)
MCV: 103.3 fL — ABNORMAL HIGH (ref 80.0–100.0)
Monocytes Absolute: 0.2 10*3/uL (ref 0.1–1.0)
Monocytes Relative: 12 %
Neutro Abs: 0.8 10*3/uL — ABNORMAL LOW (ref 1.7–7.7)
Neutrophils Relative %: 40 %
Platelets: 147 10*3/uL — ABNORMAL LOW (ref 150–400)
RBC: 3.02 MIL/uL — ABNORMAL LOW (ref 3.87–5.11)
RDW: 20.5 % — ABNORMAL HIGH (ref 11.5–15.5)
WBC: 1.9 10*3/uL — ABNORMAL LOW (ref 4.0–10.5)
nRBC: 0 % (ref 0.0–0.2)

## 2020-12-23 MED ORDER — SODIUM CHLORIDE 0.9% FLUSH
10.0000 mL | INTRAVENOUS | Status: DC | PRN
  Administered 2020-12-23 (×2): 10 mL via INTRAVENOUS

## 2020-12-23 MED ORDER — HEPARIN SOD (PORK) LOCK FLUSH 100 UNIT/ML IV SOLN
500.0000 [IU] | Freq: Once | INTRAVENOUS | Status: AC
  Administered 2020-12-23: 500 [IU] via INTRAVENOUS

## 2020-12-23 MED ORDER — POTASSIUM CHLORIDE 10 MEQ/100ML IV SOLN
10.0000 meq | INTRAVENOUS | Status: AC
  Administered 2020-12-23 (×2): 10 meq via INTRAVENOUS
  Filled 2020-12-23 (×2): qty 100

## 2020-12-23 MED ORDER — POTASSIUM CHLORIDE CRYS ER 20 MEQ PO TBCR
40.0000 meq | EXTENDED_RELEASE_TABLET | Freq: Once | ORAL | Status: AC
  Administered 2020-12-23: 40 meq via ORAL
  Filled 2020-12-23: qty 2

## 2020-12-23 MED ORDER — SODIUM CHLORIDE 0.9 % IV SOLN: INTRAVENOUS | Status: DC

## 2020-12-23 NOTE — Patient Instructions (Signed)
[facility] at [facility] Discharge Instructions  Labs drawn from portacath today   Thank you for choosing [facility] at [facility] to provide your oncology and hematology care.  To afford each patient quality time with our provider, please arrive at least 15 minutes before your scheduled appointment time.   If you have a lab appointment with the Cancer Center please come in thru the Main Entrance and check in at the main information desk.  You need to re-schedule your appointment should you arrive 10 or more minutes late.  We strive to give you quality time with our providers, and arriving late affects you and other patients whose appointments are after yours.  Also, if you no show three or more times for appointments you may be dismissed from the clinic at the providers discretion.     Again, thank you for choosing [facility].  Our hope is that these requests will decrease the amount of time that you wait before being seen by our physicians.       _____________________________________________________________  Should you have questions after your visit to [facility], please contact our office at [phone] and follow the prompts.  Our office hours are 8:00 a.m. and 4:30 p.m. Monday - Friday.  Please note that voicemails left after 4:00 p.m. may not be returned until the following business day.  We are closed weekends and major holidays.  You do have access to a nurse 24-7, just call the main number to the clinic [phone] and do not press any options, hold on the line and a nurse will answer the phone.    For prescription refill requests, have your pharmacy contact our office and allow 72 hours.    Due to Covid, you will need to wear a mask upon entering the hospital. If you do not have a mask, a mask will be given to you at the Main Entrance upon arrival. For doctor visits, patients may have 1 support person age 18  or older with them. For treatment visits, patients can not have anyone with them due to social distancing guidelines and our immunocompromised population.     

## 2020-12-23 NOTE — Patient Instructions (Signed)
[facility] at [facility] Discharge Instructions  You were seen today by Dr. Ellin Saba. He went over your recent results. You did not receive your treatment today. You received potassium supplementation today and continue taking potassium at home. Dr. Ellin Saba will see you back in 1 week for labs and follow up.   Thank you for choosing [facility] at [facility] to provide your oncology and hematology care.  To afford each patient quality time with our provider, please arrive at least 15 minutes before your scheduled appointment time.   If you have a lab appointment with the Cancer Center please come in thru the Main Entrance and check in at the main information desk  You need to re-schedule your appointment should you arrive 10 or more minutes late.  We strive to give you quality time with our providers, and arriving late affects you and other patients whose appointments are after yours.  Also, if you no show three or more times for appointments you may be dismissed from the clinic at the providers discretion.     Again, thank you for choosing [facility].  Our hope is that these requests will decrease the amount of time that you wait before being seen by our physicians.       _____________________________________________________________  Should you have questions after your visit to [facility], please contact our office at [phone] between the hours of 8:00 a.m. and 4:30 p.m.  Voicemails left after 4:00 p.m. will not be returned until the following business day.  For prescription refill requests, have your pharmacy contact our office and allow 72 hours.    Cancer Center Support Programs:   > Cancer Support Group  2nd Tuesday of the month 1pm-2pm, Journey Room

## 2020-12-23 NOTE — Progress Notes (Signed)
[place], [place] [address]   CLINIC:  Medical Oncology/Hematology  PCP:  Roderic Scarce, MD [id number] [name] / [place] [address] [phone]   REASON FOR VISIT:  Follow-up for rectal cancer  PRIOR THERAPY: Transanal resection of polyp in 10/2012  NGS Results: Not done  CURRENT THERAPY: FOLFOX & Aloxi every 2 weeks  BRIEF ONCOLOGIC HISTORY:  Oncology History  Rectal cancer ([place])  08/14/2020 Initial Diagnosis   Rectal cancer ([place])   09/02/2020 Cancer Staging   Staging form: Colon and Rectum, AJCC 8th Edition - Clinical stage from 09/02/2020: Stage IIA (cT3, cN0, cM0) - Signed by [name], MD on 09/02/2020   09/16/2020 -  Chemotherapy   The patient had palonosetron (ALOXI) injection 0.25 mg, 0.25 mg, Intravenous,  Once, 6 of 8 cycles Administration: 0.25 mg (09/16/2020), 0.25 mg (10/07/2020), 0.25 mg (10/21/2020), 0.25 mg (11/04/2020), 0.25 mg (11/26/2020), 0.25 mg (12/10/2020) pegfilgrastim-cbqv (UDENYCA) injection 6 mg, 6 mg, Subcutaneous, Once, 5 of 7 cycles Administration: 6 mg (10/09/2020), 6 mg (10/23/2020), 6 mg (11/06/2020) leucovorin 700 mg in dextrose 5 % 250 mL infusion, 380 mg/m2 = 736 mg, Intravenous,  Once, 6 of 8 cycles Dose modification: 300 mg/m2 (original dose 400 mg/m2, Cycle 6, Reason: Provider Judgment) Administration: 700 mg (09/16/2020), 700 mg (10/07/2020), 700 mg (10/21/2020), 700 mg (11/04/2020), 700 mg (11/26/2020), 552 mg (12/10/2020) oxaliplatin (ELOXATIN) 125 mg in dextrose 5 % 500 mL chemo infusion, 68 mg/m2 = 125 mg (80 % of original dose 85 mg/m2), Intravenous,  Once, 6 of 8 cycles Dose modification: 68 mg/m2 (80 % of original dose 85 mg/m2, Cycle 1, Reason: Provider Judgment), 68 mg/m2 (80 % of original dose 85 mg/m2, Cycle 2, Reason: Provider Judgment), 55 mg/m2 (original dose 85 mg/m2, Cycle 4, Reason: Other (see comments), Comment: neuropathy) Administration: 125 mg (09/16/2020), 125 mg  (10/07/2020), 125 mg (10/21/2020), 100 mg (11/04/2020), 100 mg (11/26/2020), 100 mg (12/10/2020) fluorouracil (ADRUCIL) chemo injection 750 mg, 400 mg/m2 = 750 mg, Intravenous,  Once, 6 of 8 cycles Dose modification: 300 mg/m2 (original dose 400 mg/m2, Cycle 6, Reason: Provider Judgment) Administration: 750 mg (09/16/2020), 750 mg (10/07/2020), 750 mg (10/21/2020), 750 mg (11/04/2020), 750 mg (11/26/2020) fluorouracil (ADRUCIL) 4,400 mg in sodium chloride 0.9 % 62 mL chemo infusion, 2,400 mg/m2 = 4,400 mg, Intravenous, 1 Day/Dose, 6 of 8 cycles Dose modification: 1,920 mg/m2 (80 % of original dose 2,400 mg/m2, Cycle 5, Reason: Other (see comments), Comment: diarrhea), 1,800 mg/m2 (original dose 2,400 mg/m2, Cycle 6, Reason: Provider Judgment) Administration: 4,400 mg (09/16/2020), 4,400 mg (10/07/2020), 4,400 mg (10/21/2020), 4,400 mg (11/04/2020), 3,550 mg (11/26/2020)  for chemotherapy treatment.      CANCER STAGING: Cancer Staging Rectal cancer [facility]) Staging form: Colon and Rectum, AJCC 8th Edition - Clinical stage from 09/02/2020: Stage IIA (cT3, cN0, cM0) - Signed by [name], MD on 09/02/2020   INTERVAL HISTORY:  Brenda Mclean, a 74 y.o. female, returns for routine follow-up and consideration for next cycle of chemotherapy. Brenda Mclean was last seen by Dr. Betsy Coder on 12/10/2020.  Due for cycle #7 of FOLFOX and Aloxi today.   Overall, Brenda Mclean tells me Brenda Mclean has been feeling okay. Brenda Mclean reports having redness and peeling on Brenda Mclean fingertips for the third time since Brenda Mclean last treatment; Brenda Mclean has been having soreness and erythema for the past 3 weeks and worse in Brenda Mclean hands than Brenda Mclean feet. Brenda Mclean reports having scaly skin on the sides of Brenda Mclean feet.  Brenda Mclean complains of fatigue and had diarrhea last night for which Brenda Mclean took 2 tablets of Imodium; Brenda Mclean had to take Imodium 3 days ago for diarrhea and takes as needed for diarrhea. Brenda Mclean is drinking Ensure every couple days if Brenda Mclean is not eating. Brenda Mclean is taking  potassium 20 mEq daily.  Brenda Mclean will not get treatment due to Brenda Mclean low potassium and ANC.   REVIEW OF SYSTEMS:  Review of Systems  Constitutional: Positive for fatigue (depleted). Negative for appetite change.  Gastrointestinal: Positive for diarrhea.  Skin: Positive for rash (peeling on fingertips).  Neurological: Positive for numbness (fingers).  All other systems reviewed and are negative.   PAST MEDICAL/SURGICAL HISTORY:  Past Medical History:  Diagnosis Date  . Arthritis   . COVID-19 03/2019  . GERD (gastroesophageal reflux disease)   . HLD (hyperlipidemia)   . HTN (hypertension)   . Port-A-Cath in place 09/09/2020  . Rectal adenocarcinoma ([place]) 11/2012  . Sigmoid diverticulitis [id number]   Uncomplicated   Past Surgical History:  Procedure Laterality Date  . BIOPSY  07/10/2020   Procedure: BIOPSY;  Surgeon: [name], MD;  [facility]: [facility];  Service: Endoscopy;;  . CHOLECYSTECTOMY  1998  . COLON SURGERY  10/30/2012   Dr. Audrie Lia; transanal resection of 2 cm rectal polyp; pathology revealed superficially invasive moderately differentiated adenocarcinoma arising in adenomatous polyp of distal rectum.  . COLONOSCOPY  10/24/2013   Dr. West Carbo; with propofol; small granuloma and sutures distal rectum, small polyp mid rectum removed, sigmoid diverticulosis, otherwise normal exam.  No pathology received.  Recommended colonoscopy in 5 years.  . COLONOSCOPY  09/08/2012   Dr. West Carbo; with propofol; multi lobulated friable 3 cm mass distal rectum s/p multiple biopsies, scattered sigmoid diverticulosis, otherwise normal exam.  Pathology with adenomatous polyp.  . COLONOSCOPY WITH PROPOFOL N/A 07/10/2020   Procedure: COLONOSCOPY WITH PROPOFOL;  Surgeon: [name], MD;  Location: [facility];  Service: Endoscopy;  Laterality: N/A;  10:00am  . POLYPECTOMY  07/10/2020   Procedure: POLYPECTOMY;  Surgeon: [name], MD;  Location: [facility];  Service: Endoscopy;;  .  PORTACATH PLACEMENT Left 09/09/2020   Procedure: INSERTION PORT-A-CATH;  Surgeon: Aviva Signs, MD;  Location: [facility];  Service: General;  Laterality: Left;  . REPLACEMENT TOTAL HIP W/  RESURFACING IMPLANTS Bilateral   . 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 REPLACEMENT TOTAL KNEE Right 12/03/2014  . TOTAL SHOULDER ARTHROPLASTY  04/26/2018    SOCIAL HISTORY:  Social History   Socioeconomic History  . Marital status: Married    Spouse name: Not on file  . Number of children: 2  . Years of education: Not on file  . Highest education level: Not on file  Occupational History    Employer: retired  Tobacco Use  . Smoking status: Never Smoker  . Smokeless tobacco: Never Used  Vaping Use  . Vaping Use: Never used  Substance and Sexual Activity  . Alcohol use: Never  . Drug use: Never  . Sexual activity: Not Currently  Other Topics Concern  . Not on file  Social History Narrative  . Not on file   Social Determinants of Health   Financial Resource Strain: Low Risk   . Difficulty of Paying Living Expenses: Not hard at all  Food Insecurity: No Food Insecurity  . Worried About Charity fundraiser in the Last Year: Never true  . Ran Out of Food in the Last Year: Never true  Transportation Needs: No  Transportation Needs  . Lack of Transportation (Medical): No  . Lack of Transportation (Non-Medical): No  Physical Activity: Inactive  . Days of Exercise per Week: 0 days  . Minutes of Exercise per Session: 0 min  Stress: No Stress Concern Present  . Feeling of Stress : Only a little  Social Connections: Moderately Integrated  . Frequency of Communication with Friends and Family: More than three times a week  . Frequency of Social Gatherings with Friends and Family: More than three times a week  . Attends Religious Services: More than 4 times per year  . Active Member of Clubs or Organizations: No  . Attends Banker Meetings: Never  . Marital Status: Married   Catering manager Violence: Not At Risk  . Fear of Current or Ex-Partner: No  . Emotionally Abused: No  . Physically Abused: No  . Sexually Abused: No    FAMILY HISTORY:  Family History  Problem Relation Age of Onset  . Arthritis Mother   . High blood pressure Mother   . Heart disease Father   . Breast cancer Maternal Aunt   . Stroke Maternal Grandmother   . Cancer Paternal Grandmother   . Diabetes Brother   . Colon cancer Neg Hx     CURRENT MEDICATIONS:  Current Outpatient Medications  Medication Sig Dispense Refill  . celecoxib (CELEBREX) 200 MG capsule Take 200 mg by mouth daily.    Marland Kitchen dexamethasone 10 mg in sodium chloride 0.9 % 50 mL Inject 10 mg into the vein every 14 (fourteen) days. Prior to chemotherapy administration    . feeding supplement (ENSURE ENLIVE / ENSURE PLUS) LIQD Take 237 mLs by mouth 2 (two) times daily between meals. 237 mL 12  . fluorouracil CALGB 78675 in sodium chloride 0.9 % 150 mL Inject 2,400 mg/m2 into the vein over 48 hr.    . FLUOROURACIL IV Inject 400 mg/m2 into the vein every 14 (fourteen) days.    Marland Kitchen LEUCOVORIN CALCIUM IV Inject 400 mg/m2 into the vein every 14 (fourteen) days.    Marland Kitchen lidocaine-prilocaine (EMLA) cream Apply a small amount to port a cath site and cover with plastic wrap 1 hour prior to chemotherapy appointments 30 g 3  . omega-3 acid ethyl esters (LOVAZA) 1 g capsule Take 2 g by mouth daily.    Marland Kitchen omeprazole (PRILOSEC) 20 MG capsule Take 20 mg by mouth daily.    . OXALIPLATIN IV Inject 85 mg/m2 into the vein every 14 (fourteen) days.    . palonosetron (ALOXI) 0.25 MG/5ML injection Inject 0.25 mg into the vein every 14 (fourteen) days. Prior to chemotherapy administration    . potassium chloride SA (KLOR-CON) 20 MEQ tablet Take 1 tablet (20 mEq total) by mouth daily. 30 tablet 2  . simvastatin (ZOCOR) 40 MG tablet Take 40 mg by mouth at bedtime.     . Turmeric 500 MG TABS Take 500 mg by mouth daily.    . valsartan (DIOVAN) 320 MG  tablet Take 320 mg by mouth daily.    . prochlorperazine (COMPAZINE) 10 MG tablet Take 1 tablet (10 mg total) by mouth every 6 (six) hours as needed (Nausea or vomiting). (Patient not taking: Reported on 12/23/2020) 30 tablet 1  . traMADol (ULTRAM) 50 MG tablet Take 1 tablet (50 mg total) by mouth every 6 (six) hours as needed. (Patient not taking: Reported on 12/23/2020) 20 tablet 0   No current facility-administered medications for this visit.   Facility-Administered Medications Ordered in Other  Visits  Medication Dose Route Frequency Provider Last Rate Last Admin  . heparin lock flush 100 unit/mL  500 Units Intracatheter Once PRN Doreatha Massed, MD      . sodium chloride flush (NS) 0.9 % injection 10 mL  10 mL Intracatheter PRN Doreatha Massed, MD   10 mL at 11/26/20 0950    ALLERGIES:  No Known Allergies  PHYSICAL EXAM:  Performance status (ECOG): 0 - Asymptomatic  Vitals:   12/23/20 0819  BP: 135/79  Pulse: 77  Resp: 18  Temp: (!) 96.8 F (36 C)  SpO2: 98%   Wt Readings from Last 3 Encounters:  12/23/20 167 lb 8.8 oz (76 kg)  12/10/20 172 lb 6.4 oz (78.2 kg)  11/26/20 170 lb 6.4 oz (77.3 kg)   Physical Exam Vitals reviewed.  Constitutional:      Appearance: Normal appearance. Brenda Mclean is obese.  Cardiovascular:     Rate and Rhythm: Normal rate and regular rhythm.     Pulses: Normal pulses.     Heart sounds: Normal heart sounds.  Pulmonary:     Effort: Pulmonary effort is normal.     Breath sounds: Normal breath sounds.  Chest:     Comments: Port-a-Cath in L chest Skin:    Findings: Erythema (+ peeling on tips of fingers) present.  Neurological:     General: No focal deficit present.     Mental Status: Brenda Mclean is alert and oriented to person, place, and time.  Psychiatric:        Mood and Affect: Mood normal.        Behavior: Behavior normal.     LABORATORY DATA:  I have reviewed the labs as listed.  CBC Latest Ref Rng & Units 12/23/2020 12/10/2020  11/26/2020  WBC 4.0 - 10.5 K/uL 1.9(L) 3.4(L) 7.1  Hemoglobin 12.0 - 15.0 g/dL 10.3(L) 9.5(L) 10.0(L)  Hematocrit 36.0 - 46.0 % 31.2(L) 28.8(L) 32.2(L)  Platelets 150 - 400 K/uL 147(L) 120(L) 248   CMP Latest Ref Rng & Units 12/10/2020 11/26/2020 11/17/2020  Glucose 70 - 99 mg/dL 93 175(Z) 025(E)  BUN 8 - 23 mg/dL 21 19 52(D)  Creatinine 0.44 - 1.00 mg/dL 7.82 4.23(N) 3.61(W)  Sodium 135 - 145 mmol/L 139 139 137  Potassium 3.5 - 5.1 mmol/L 3.8 4.0 3.5  Chloride 98 - 111 mmol/L 107 105 111  CO2 22 - 32 mmol/L 22 24 19(L)  Calcium 8.9 - 10.3 mg/dL 9.1 9.0 9.1  Total Protein 6.5 - 8.1 g/dL 6.6 6.5 6.9  Total Bilirubin 0.3 - 1.2 mg/dL 0.6 0.7 0.1(L)  Alkaline Phos 38 - 126 U/L 79 98 154(H)  AST 15 - 41 U/L 23 26 26   ALT 0 - 44 U/L 19 23 21     DIAGNOSTIC IMAGING:  I have independently reviewed the scans and discussed with the patient. No results found.   ASSESSMENT:  1. Rectal adenocarcinoma: -History of rectal polyp with adenocarcinoma, status post transanal resection of the polyp in November 2013. -Colonoscopy on July 10, 2020 shows 2.5 cm centrally depressed ulcerated lesion with heaped up circular margin, 8 cm from anal verge. Distally, just above the anal verge, suture line consistent with history of prior transanal polypectomy. -Pathology of the rectal mass consistent with adenocarcinoma. Splenic flexure polypectomy was tubular adenoma. Sigmoid colon biopsy was benign. -CT CAP with contrast on August 05, 2020 shows no evidence of neoplastic disease within the chest, abdomen or pelvis. Complicated left midpole renal cyst versus complex cystic mass. No lung masses. -Pelvis  MRI on 08/27/2020 showed T3BN0 tumor. Distance of tumor/node from mesorectal fascia was 1 cm. Distance from tumor to the internal anal sphincter was 4.2 cm. Tumor size is 2.2 x 2.2 cm. -Total neoadjuvant therapy with 4 months of FOLFOX followed by long course chemo RT with Xeloda followed by restaging pelvis  MRI 6 to 8 weeks after completion of XRT followed by resection recommended. -Cycle 1 of FOLFOX on 09/16/2020  2. Social/family history: -Brenda Mclean lives at home with Brenda Mclean husband. 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 No history of smoking. -Maternal aunt had breast cancer.   PLAN:  1.Stage II (T3N0) rectal adenocarcinoma: -Brenda Mclean has some weakness from chemotherapy. -Brenda Mclean also developed hand-foot skin reaction with skin peeling on the tips of the fingers with redness.  Mild sensitivity present.  Brenda Mclean is using cream with menthol. -Reviewed labs today.  White count is 1.9 with neutrophil count is 0.8.  Brenda Mclean did not receive Neulasta after last cycle. -We will hold Brenda Mclean treatment today.  Recheck counts next week. -5-FU was dose reduced by 20% during last cycle for mild HFSR symptoms.  2. Left kidney lesion: -MRI recommended by Dr. Alyson Ingles in 6 months.  3. Hypokalemia:  -Continue potassium 20 mEq tablet daily. -Potassium today is 2.7.  Brenda Mclean will receive IV and p.o. potassium.  4.  Diarrhea: -Brenda Mclean is having intermittent diarrhea for which Brenda Mclean takes half to 1 tablet of Imodium which helps.   Orders placed this encounter:  No orders of the defined types were placed in this encounter.    [name], MD [place] [phone]   I, Milinda Antis, am acting as a scribe for Dr. Sanda Linger.  I, [name] MD, have reviewed the above documentation for accuracy and completeness, and I agree with the above.

## 2020-12-23 NOTE — Progress Notes (Signed)
[id number] Labs reviewed with and pt seen by Dr. Ellin Saba and pt's FOLFOX infusion to be held today due to low WBC and ANC per MD K+ 2.7 today and pt to receive 20 meq Potassium IV and 40 meq Potassium PO prior to and after IV Potassium dose per MD                                                                                    Brenda Mclean tolerated IV Potassium well without complaints or incident. VSS upon discharge. Pt discharged self ambulatory using her cane in satisfactory condition

## 2020-12-23 NOTE — Progress Notes (Signed)
CRITICAL VALUE ALERT  Critical Value:  K+ 2.7  Date & Time Notied:  12/23/2020 at 0906  Provider Notified: Dr. Ellin Saba  Orders Received/Actions taken: give KCl 20 mEq IV and K-dur 40 mEq po x 2 doses, pre and post KCl infusion.

## 2020-12-23 NOTE — Patient Instructions (Signed)
[facility] at [facility] Discharge Instructions  Chemo tx held today with Potassium given PO and IV. Follow-up as scheduled   Thank you for choosing [facility] at [facility] to provide your oncology and hematology care.  To afford each patient quality time with our provider, please arrive at least 15 minutes before your scheduled appointment time.   If you have a lab appointment with the Cancer Center please come in thru the Main Entrance and check in at the main information desk.  You need to re-schedule your appointment should you arrive 10 or more minutes late.  We strive to give you quality time with our providers, and arriving late affects you and other patients whose appointments are after yours.  Also, if you no show three or more times for appointments you may be dismissed from the clinic at the providers discretion.     Again, thank you for choosing [facility].  Our hope is that these requests will decrease the amount of time that you wait before being seen by our physicians.       _____________________________________________________________  Should you have questions after your visit to [facility], please contact our office at [phone] and follow the prompts.  Our office hours are 8:00 a.m. and 4:30 p.m. Monday - Friday.  Please note that voicemails left after 4:00 p.m. may not be returned until the following business day.  We are closed weekends and major holidays.  You do have access to a nurse 24-7, just call the main number to the clinic [phone] and do not press any options, hold on the line and a nurse will answer the phone.    For prescription refill requests, have your pharmacy contact our office and allow 72 hours.    Due to Covid, you will need to wear a mask upon entering the hospital. If you do not have a mask, a mask will be given to you at the Main Entrance upon arrival. For doctor visits,  patients may have 1 support person age 76 or older with them. For treatment visits, patients can not have anyone with them due to social distancing guidelines and our immunocompromised population.

## 2020-12-25 ENCOUNTER — Encounter (HOSPITAL_COMMUNITY): Payer: Medicare Other

## 2020-12-27 ENCOUNTER — Emergency Department (HOSPITAL_COMMUNITY): Payer: Medicare Other

## 2020-12-27 ENCOUNTER — Emergency Department (HOSPITAL_COMMUNITY)
Admission: EM | Admit: 2020-12-27 | Discharge: 2020-12-28 | Disposition: A | Discharge status: Home / Self Care | Payer: Medicare Other | Attending: Emergency Medicine | Admitting: Emergency Medicine

## 2020-12-27 ENCOUNTER — Encounter (HOSPITAL_COMMUNITY): Payer: Self-pay | Admitting: *Deleted

## 2020-12-27 ENCOUNTER — Other Ambulatory Visit: Payer: Self-pay

## 2020-12-27 DIAGNOSIS — R911 Solitary pulmonary nodule: Secondary | ICD-10-CM | POA: Diagnosis not present

## 2020-12-27 DIAGNOSIS — Z20822 Contact with and (suspected) exposure to covid-19: Secondary | ICD-10-CM | POA: Insufficient documentation

## 2020-12-27 DIAGNOSIS — F4489 Other dissociative and conversion disorders: Secondary | ICD-10-CM | POA: Insufficient documentation

## 2020-12-27 DIAGNOSIS — R4182 Altered mental status, unspecified: Secondary | ICD-10-CM | POA: Diagnosis present

## 2020-12-27 DIAGNOSIS — I10 Essential (primary) hypertension: Secondary | ICD-10-CM | POA: Insufficient documentation

## 2020-12-27 DIAGNOSIS — E876 Hypokalemia: Secondary | ICD-10-CM

## 2020-12-27 DIAGNOSIS — Z8616 Personal history of COVID-19: Secondary | ICD-10-CM | POA: Diagnosis not present

## 2020-12-27 LAB — CBC WITH DIFFERENTIAL/PLATELET
Band Neutrophils: 3 %
Basophils Absolute: 0 10*3/uL (ref 0.0–0.1)
Basophils Relative: 0 %
Eosinophils Absolute: 0 10*3/uL (ref 0.0–0.5)
Eosinophils Relative: 0 %
HCT: 32.2 % — ABNORMAL LOW (ref 36.0–46.0)
Hemoglobin: 10.3 g/dL — ABNORMAL LOW (ref 12.0–15.0)
Lymphocytes Relative: 49 %
Lymphs Abs: 1.6 10*3/uL (ref 0.7–4.0)
MCH: 33.7 pg (ref 26.0–34.0)
MCHC: 32 g/dL (ref 30.0–36.0)
MCV: 105.2 fL — ABNORMAL HIGH (ref 80.0–100.0)
Monocytes Absolute: 0.7 10*3/uL (ref 0.1–1.0)
Monocytes Relative: 22 %
Neutro Abs: 0.9 10*3/uL — ABNORMAL LOW (ref 1.7–7.7)
Neutrophils Relative %: 26 %
Platelets: 135 10*3/uL — ABNORMAL LOW (ref 150–400)
RBC: 3.06 MIL/uL — ABNORMAL LOW (ref 3.87–5.11)
RDW: 19.9 % — ABNORMAL HIGH (ref 11.5–15.5)
WBC: 3.2 10*3/uL — ABNORMAL LOW (ref 4.0–10.5)
nRBC: 0 % (ref 0.0–0.2)

## 2020-12-27 LAB — BASIC METABOLIC PANEL
Anion gap: 11 (ref 5–15)
BUN: 13 mg/dL (ref 8–23)
CO2: 24 mmol/L (ref 22–32)
Calcium: 8.9 mg/dL (ref 8.9–10.3)
Chloride: 103 mmol/L (ref 98–111)
Creatinine, Ser: 0.82 mg/dL (ref 0.44–1.00)
GFR, Estimated: >60 mL/min (ref >60)
Glucose, Bld: 101 mg/dL — ABNORMAL HIGH (ref 70–99)
Potassium: 2.7 mmol/L — CL (ref 3.5–5.1)
Sodium: 138 mmol/L (ref 135–145)

## 2020-12-27 IMAGING — CT CT HEAD W/O CM
4 series · 17 of 47 positions shown, 19 images · non-contrast
Comparison: None.

CLINICAL DATA: Delirium

EXAM:
CT HEAD WITHOUT CONTRAST
TECHNIQUE: Contiguous axial images were obtained from the base of the skull
through the vertex without intravenous contrast.

[Series 2: head w o · axial · 0.50mm/px · z∈[+1215,+1325]mm · 7 of 30 slices shown, 9 images]
[im 4/30  brain]
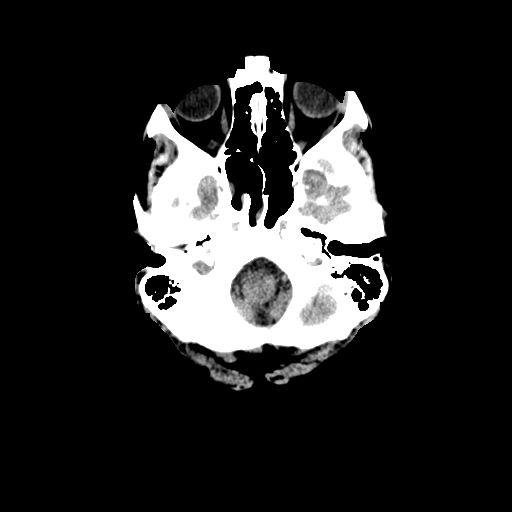
[im 4/30  bone]
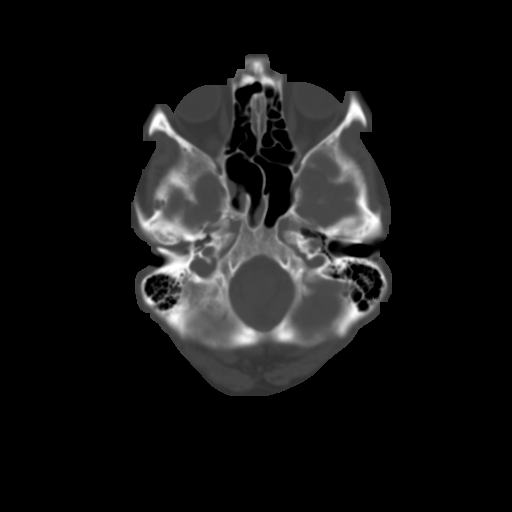
[im 8/30  brain]
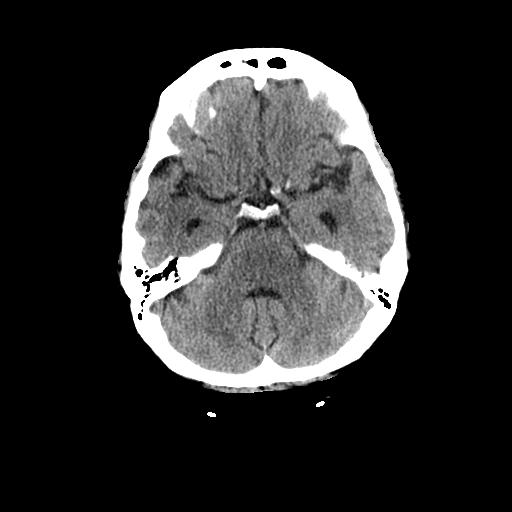
[im 11/30  brain]
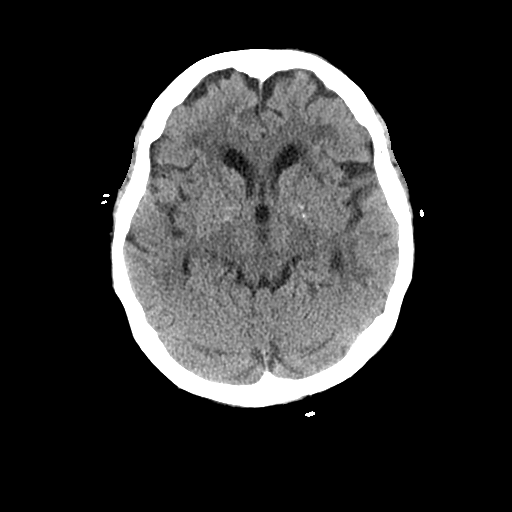
[im 15/30  brain]
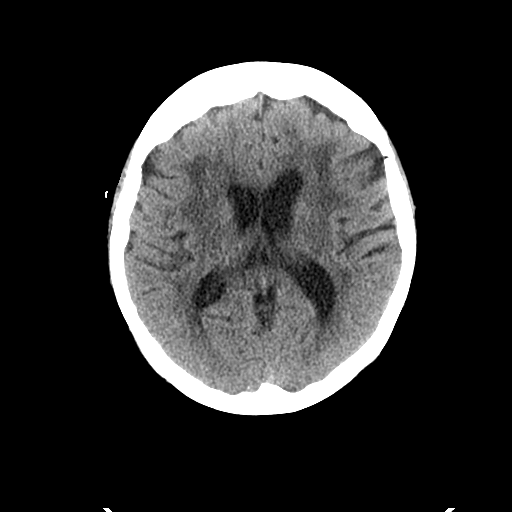
[im 19/30  brain]
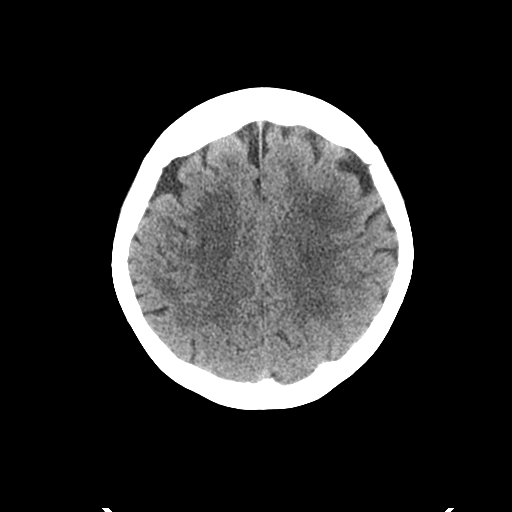
[im 19/30  bone]
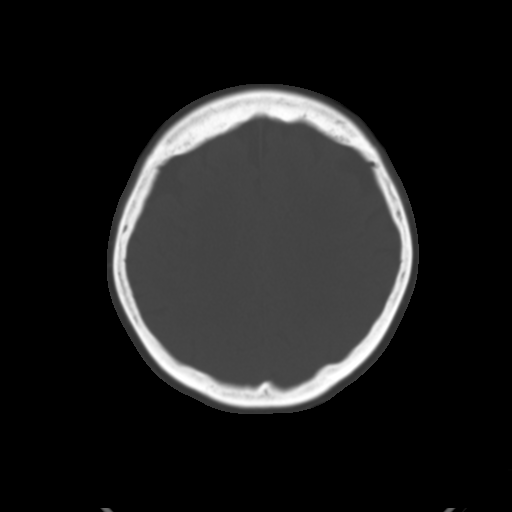
[im 22/30  brain]
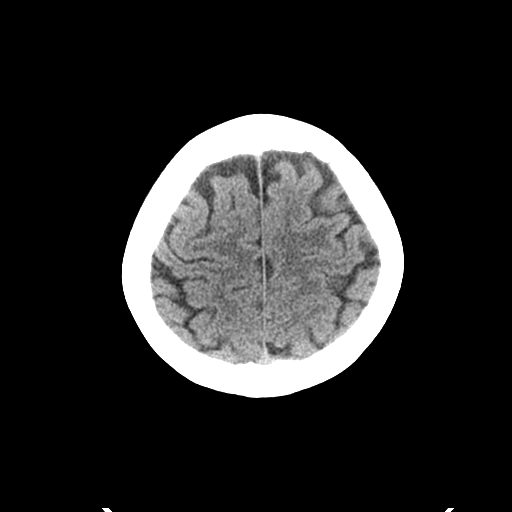
[im 26/30  brain]
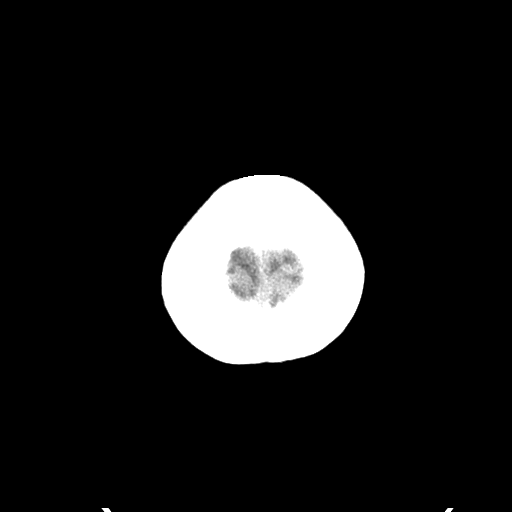

[Series 3: head bone · axial · 0.50mm/px · z∈[+1214,+1266]mm · 4 of 75 slices shown]
[im 8/75  bone]
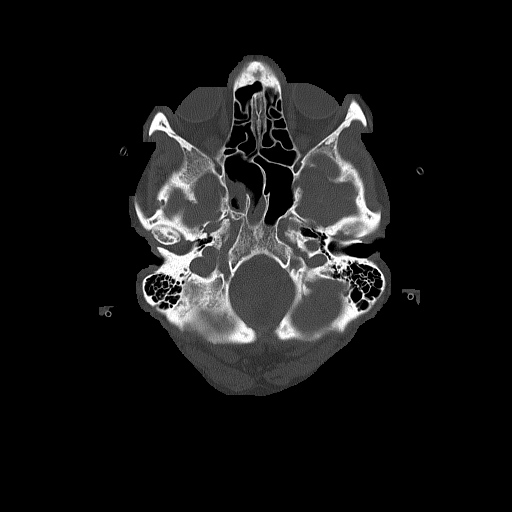
[im 15/75  bone]
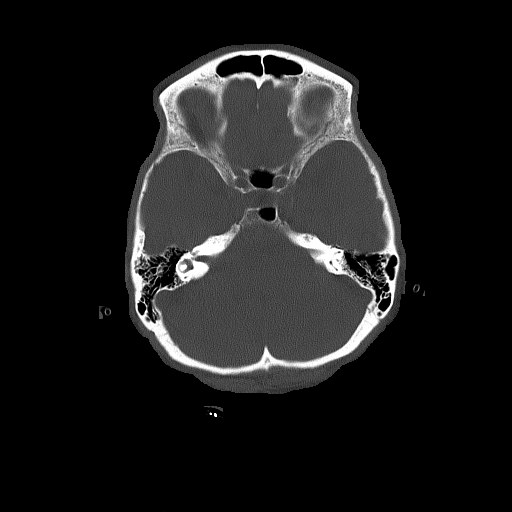
[im 23/75  bone]
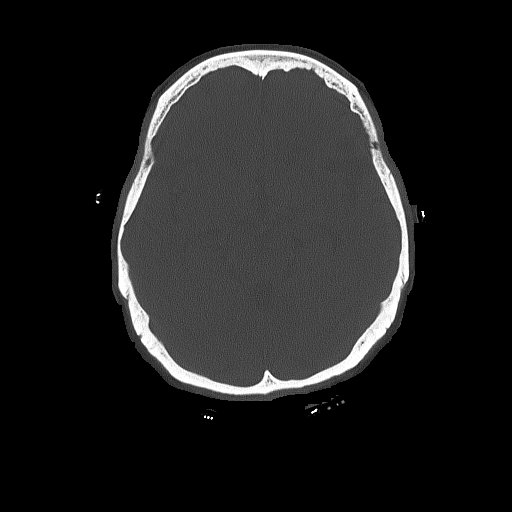
[im 34/75  bone]
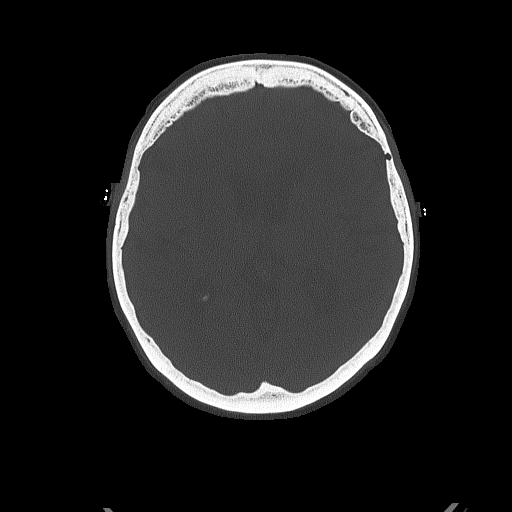

[Series 4: coronal soft · coronal · 0.33mm/px · 3 of 68 slices shown]
[im 23/68  brain]
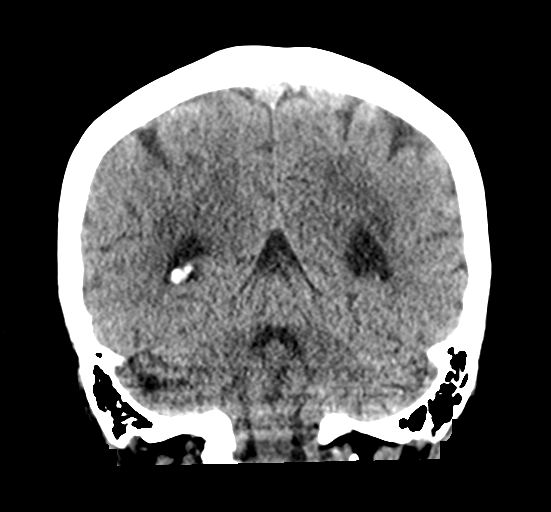
[im 30/68  brain]
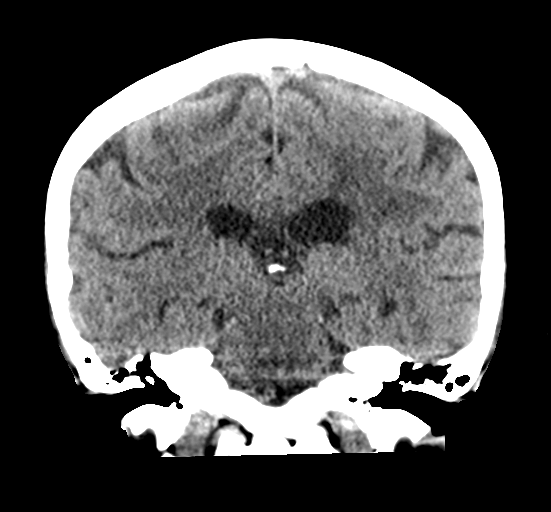
[im 38/68  brain]
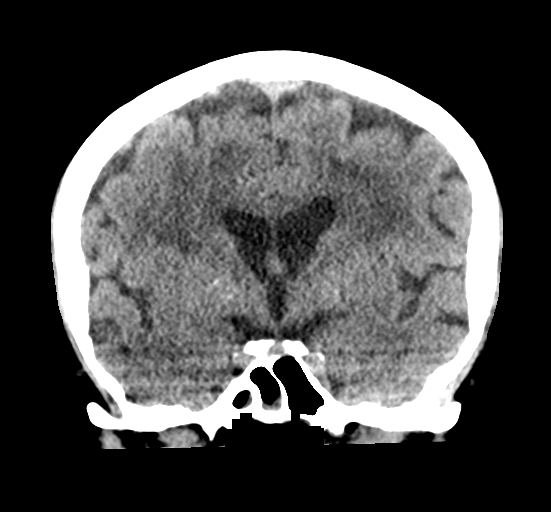

[Series 5: sagittal soft · sagittal · 0.34mm/px · 3 of 59 slices shown]
[im 20/59  brain]
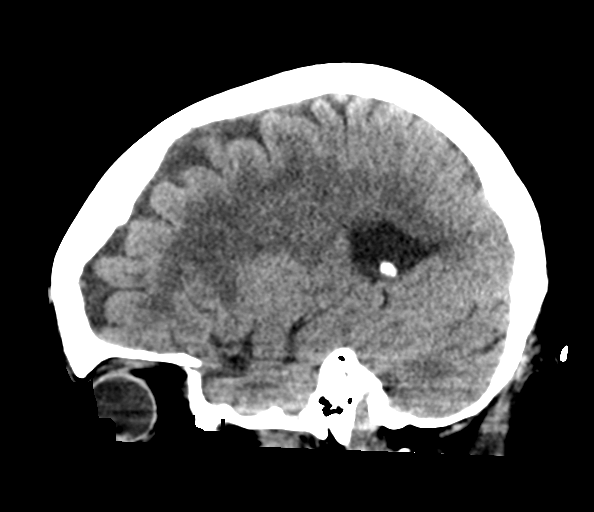
[im 30/59  brain]
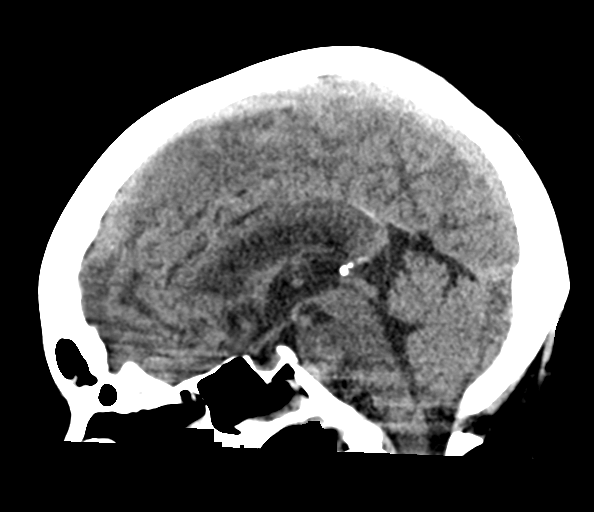
[im 39/59  brain]
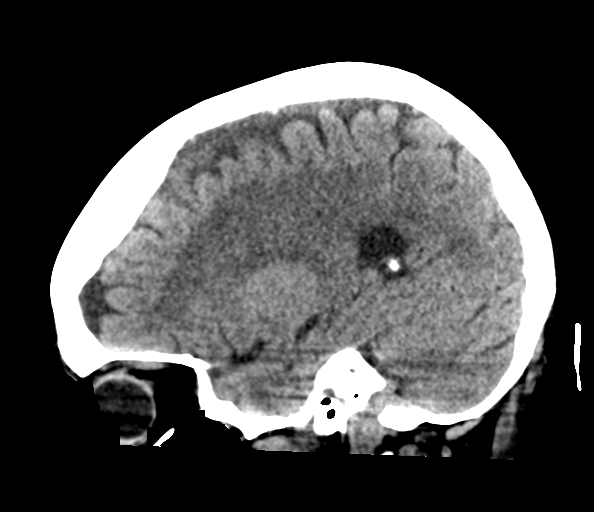

[17 of 47 positions shown; findings below may reference images not displayed]

FINDINGS: Brain: No acute territorial infarction, hemorrhage or intracranial
mass. Mild atrophy. Moderate hypodensity in the white matter
consistent with chronic small vessel ischemic change. The ventricles
are nonenlarged.

Vascular: No hyperdense vessels. Scattered carotid vascular
calcification

Skull: Normal. Negative for fracture or focal lesion.

Sinuses/Orbits: Mucosal thickening in the sphenoid sinuses

Other: None
IMPRESSION: 1. No CT evidence for acute intracranial abnormality.
2. Atrophy and chronic small vessel ischemic changes of the white
matter.

## 2020-12-27 IMAGING — DX DG CHEST 1V PORT
1 series · 1 of 1 positions shown · non-contrast
Comparison: [DATE]

CLINICAL DATA: Confusion for 6 weeks. On chemotherapy for colon
cancer.

EXAM:
PORTABLE CHEST 1 VIEW

[chest ap]
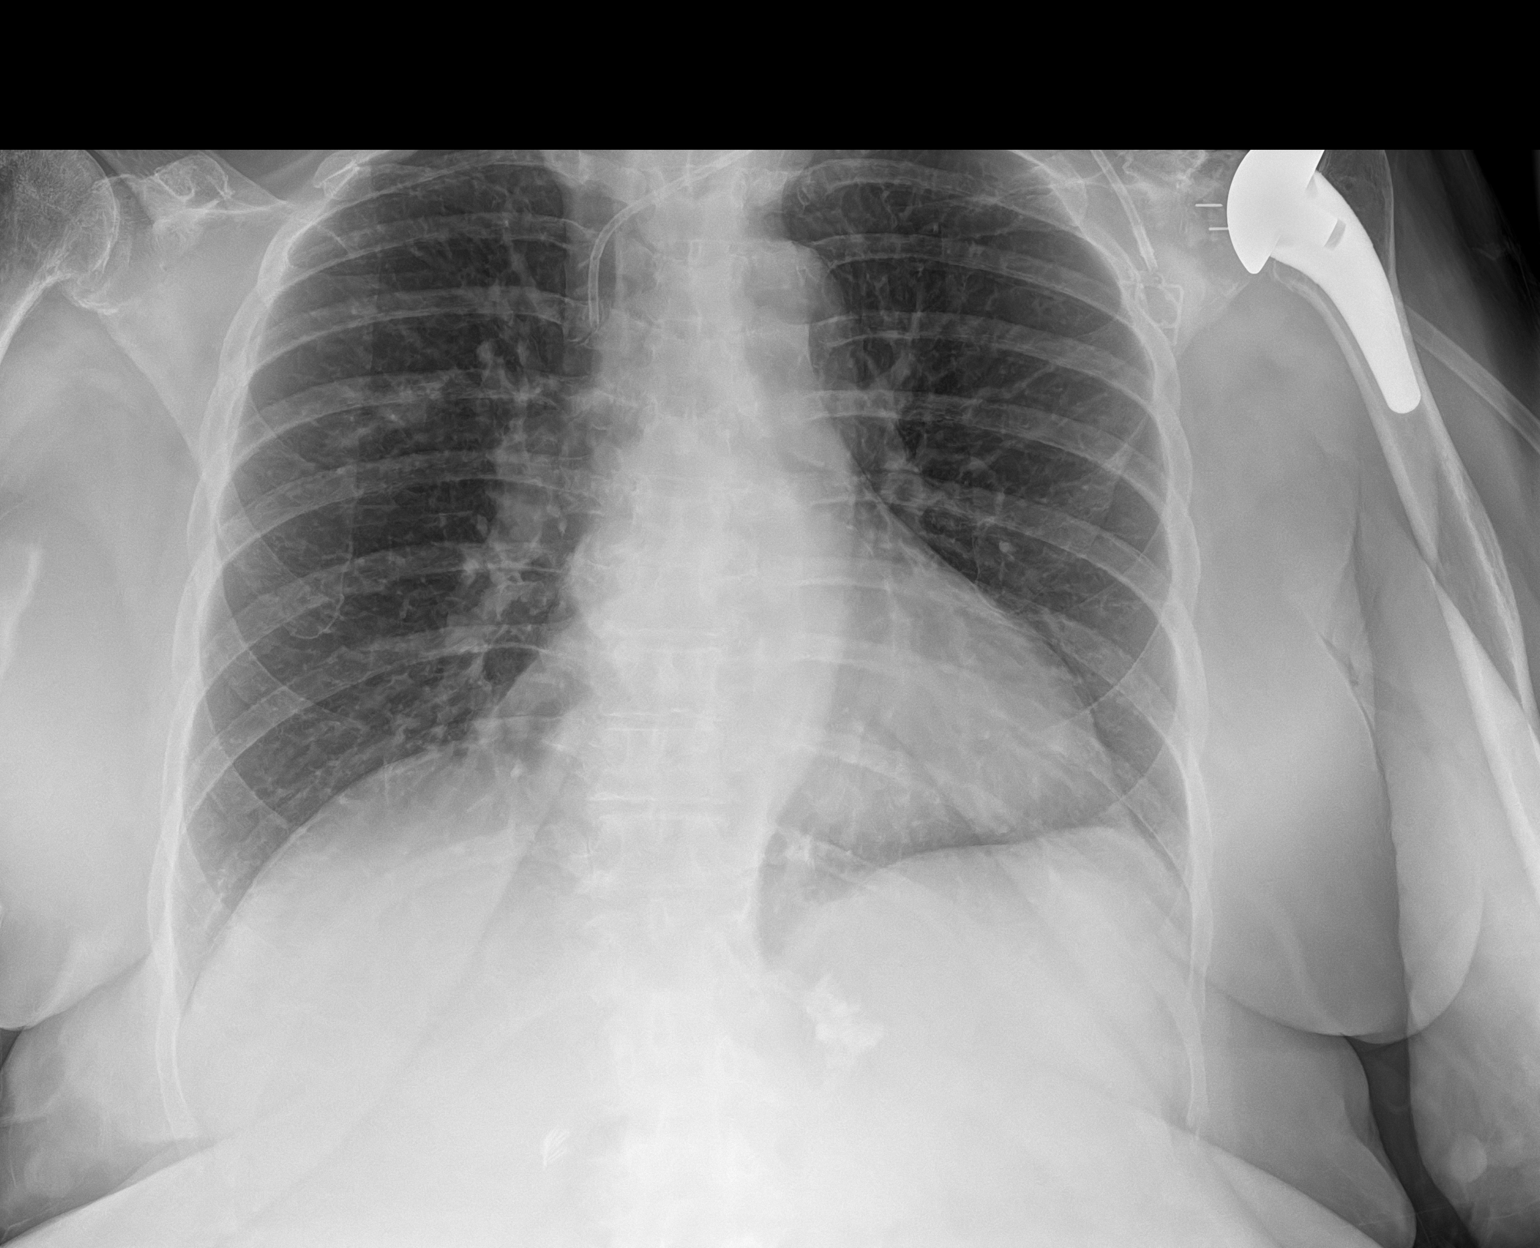

[1 of 1 positions shown; findings below may reference images not displayed]

FINDINGS: Heart size and pulmonary vascularity are normal. Mild peribronchial
thickening in the lungs suggesting chronic bronchitis. Suggestion of
a developing nodule in the right mid lung, measuring 1.3 cm
diameter. This could represent vascular artifact, developing nodular
metastasis, or focal pneumonia. Consider CT for further evaluation.
Left lung is clear. No pleural effusions. No pneumothorax. Power
port type central venous catheter with tip over the mid SVC region.
Postoperative changes in the left shoulder. Degenerative changes in
the spine and right shoulder. Surgical clips in the right upper
quadrant. Calcifications in the left upper quadrant.
IMPRESSION: 1. Possible developing nodule in the right mid lung. Consider CT for
further evaluation.
2. Chronic bronchitic changes in the lungs.

## 2020-12-27 IMAGING — CT CT CHEST W/ CM
2 of 3 series · 15 of 36 positions shown, 18 images · IV contrast (Omnipaque or Isovue)
Comparison: Radiograph same day, [DATE]

CLINICAL DATA: Abnormal nodule history of rectal cancer

EXAM:
CT CHEST WITH CONTRAST
TECHNIQUE: Multidetector CT imaging of the chest was performed during
intravenous contrast administration.
CONTRAST:  75mL OMNIPAQUE IOHEXOL 300 MG/ML  SOLN

[Series 2: routine chest with · axial · 0.75mm/px · z∈[+797,+1071]mm · 12 of 161 slices shown, 15 images]
[im 12/161  mediastinal]
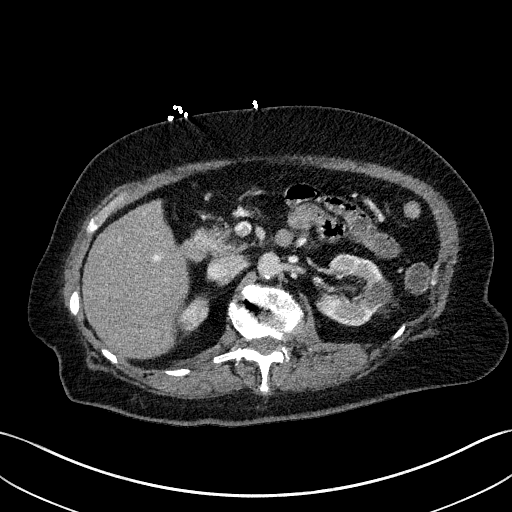
[im 12/161  lung]
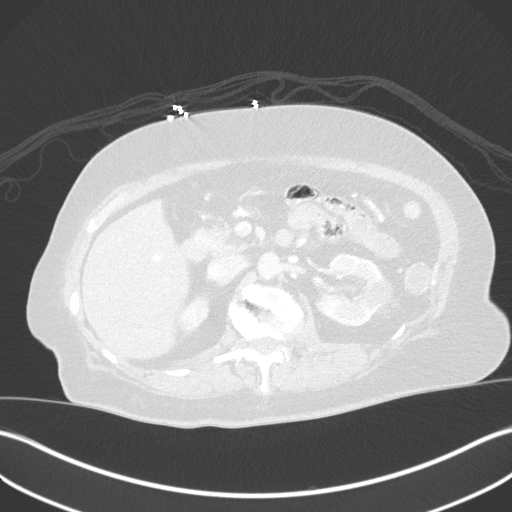
[im 24/161  lung]
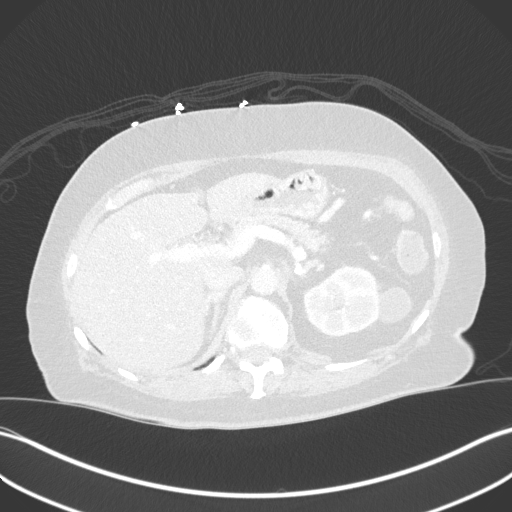
[im 36/161  lung]
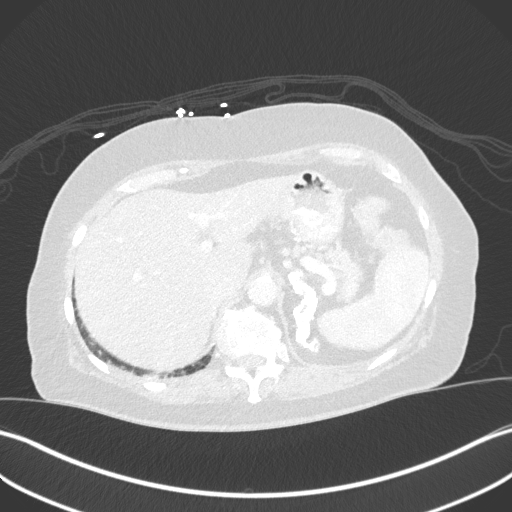
[im 48/161  lung]
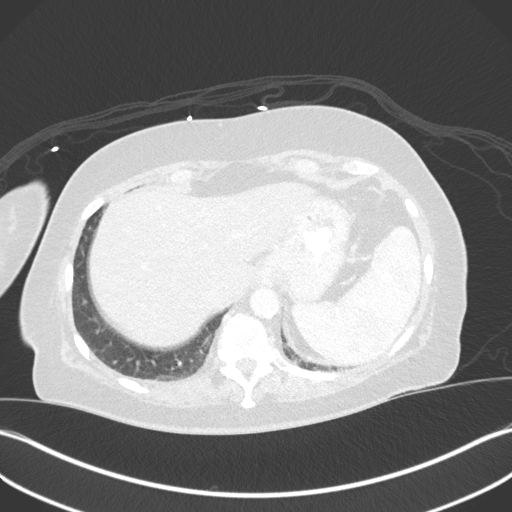
[im 60/161  mediastinal]
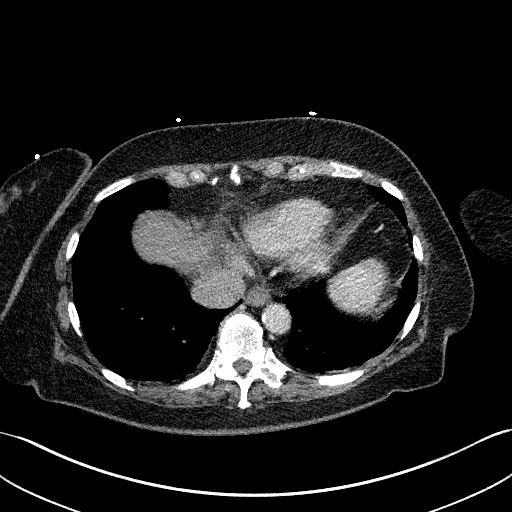
[im 60/161  lung]
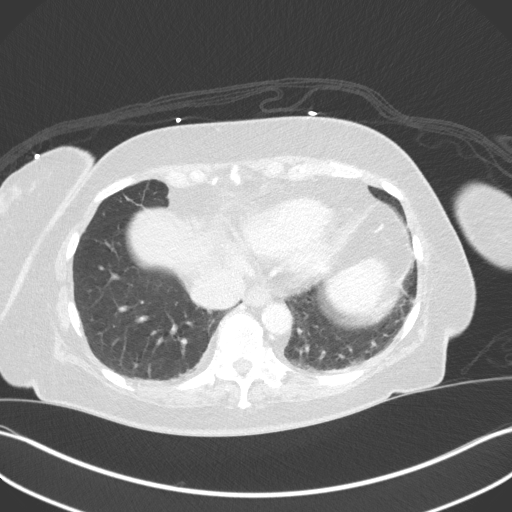
[im 72/161  lung]
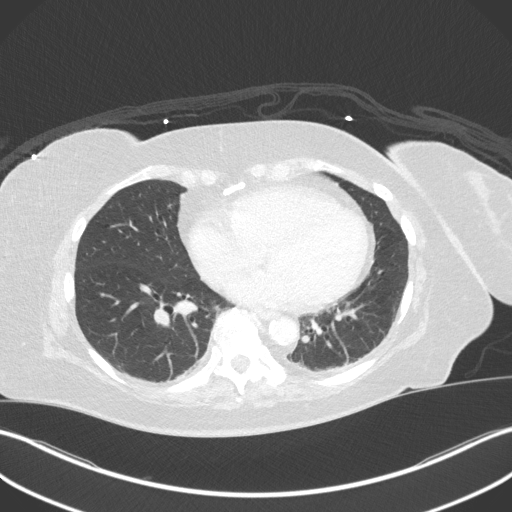
[im 89/161  lung]
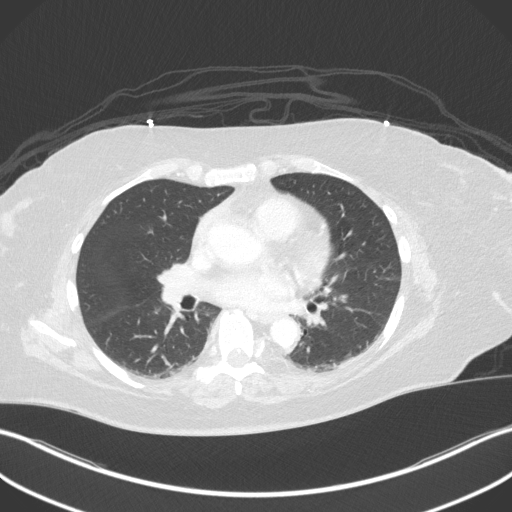
[im 101/161  lung]
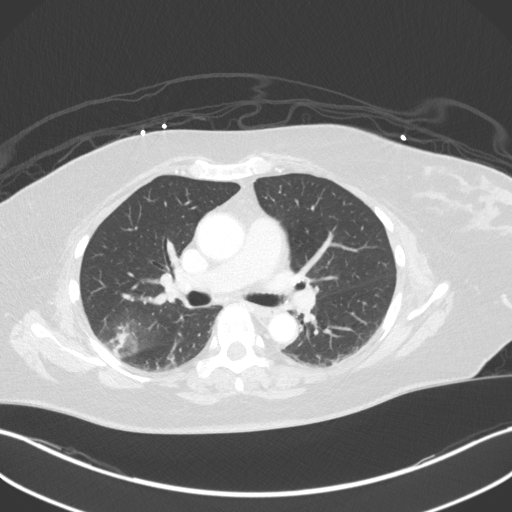
[im 113/161  mediastinal]
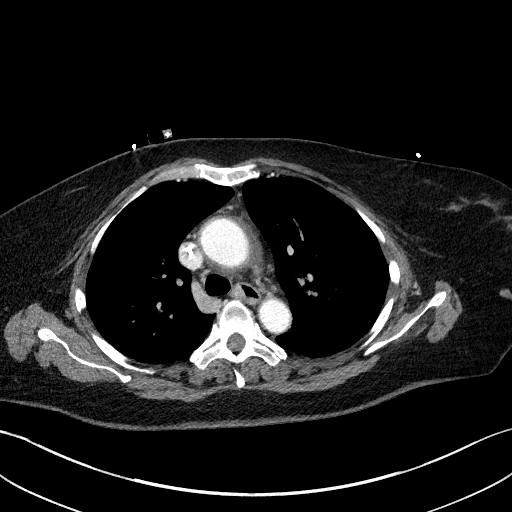
[im 113/161  lung]
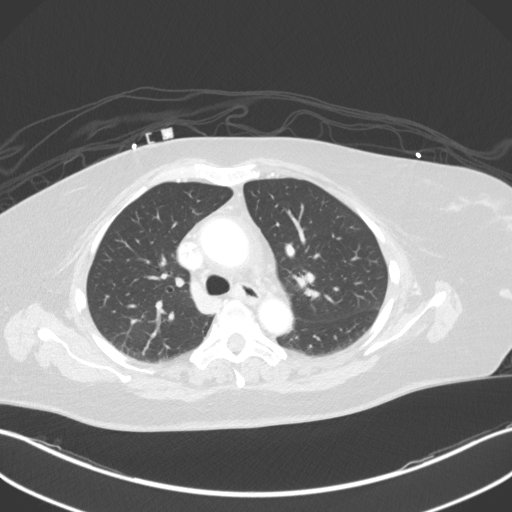
[im 125/161  lung]
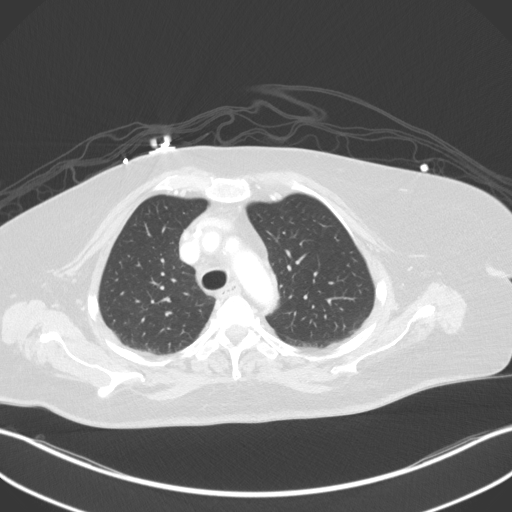
[im 137/161  lung]
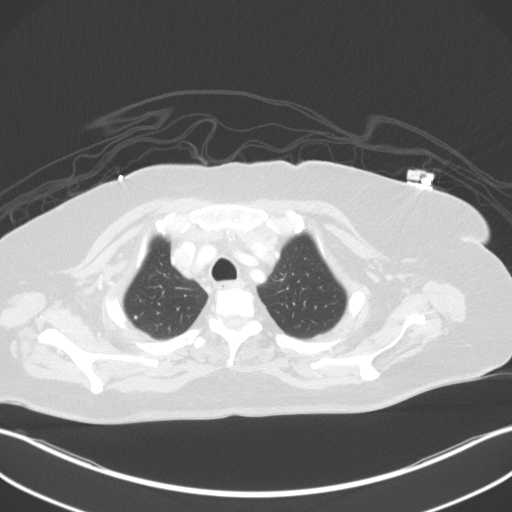
[im 149/161  lung]
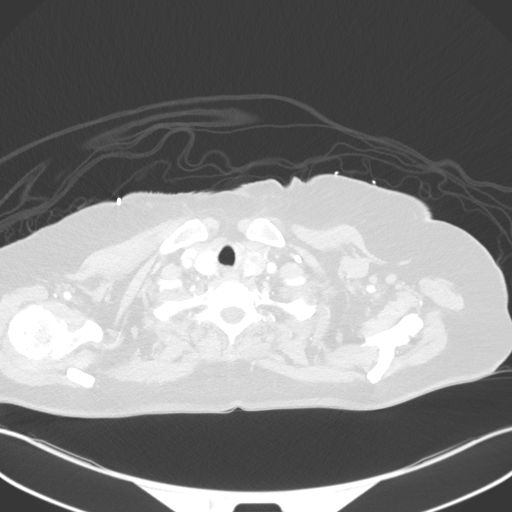

[Series 5: coronal · coronal · 0.66mm/px · 3 of 126 slices shown]
[im 26/126  lung]
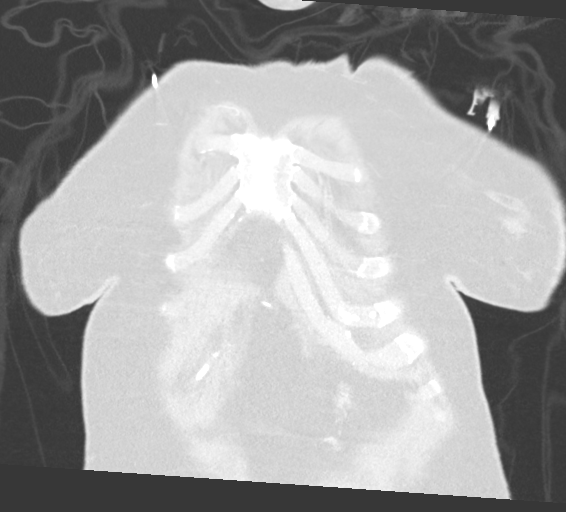
[im 51/126  lung]
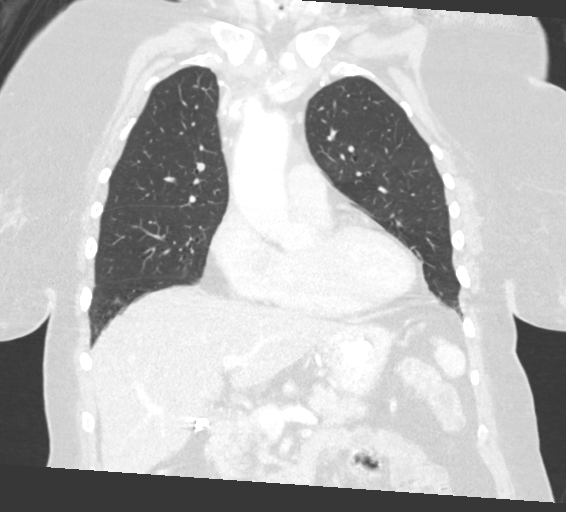
[im 76/126  lung]
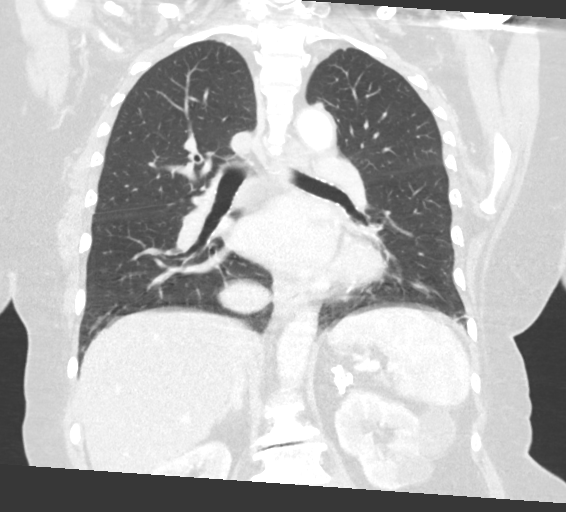

[15 of 36 positions shown; findings below may reference images not displayed]

FINDINGS: Cardiovascular: Scattered aortic atherosclerosis is seen. Mild
coronary artery calcifications are present. The heart size is
normal. There is no pericardial thickening or effusion.

Mediastinum/Nodes: There are no enlarged mediastinal, hilar or
axillary lymph nodes. The thyroid gland, trachea and esophagus
demonstrate no significant findings.

Lungs/Pleura: There is an ill-defined spiculated nodular opacity
seen within the posterior right upper lobe measuring 1.9 x 1.2 cm.
Adjacent ground-glass opacity seen posteriorly within the right
upper lobe. A small amount of streaky airspace opacity seen at lung
bases.

Upper abdomen: The visualized portion of the upper abdomen is
unremarkable.

Musculoskeletal/Chest wall: There is no chest wall mass or
suspicious osseous finding. No acute osseous abnormality
IMPRESSION: New ill-defined spiculated nodular opacity within the posterior
right upper lobe measuring 1.9 x 1.2 cm which could represent a
focus of inflammatory/infectious etiology, however cannot exclude a
pulmonary metastatic lesion given the patient's history. would
recommend short interval follow up CT exam within 1-2 weeks to
determine resolution

## 2020-12-27 MED ORDER — IOHEXOL 300 MG/ML  SOLN
75.0000 mL | Freq: Once | INTRAMUSCULAR | Status: AC | PRN
  Administered 2020-12-27: 75 mL via INTRAVENOUS

## 2020-12-27 MED ORDER — POTASSIUM CHLORIDE CRYS ER 20 MEQ PO TBCR: 20.0000 meq | EXTENDED_RELEASE_TABLET | Freq: Two times a day (BID) | ORAL | 0 refills | Status: DC

## 2020-12-27 MED ORDER — MAGNESIUM OXIDE 400 (241.3 MG) MG PO TABS
800.0000 mg | ORAL_TABLET | Freq: Once | ORAL | Status: AC
  Administered 2020-12-27: 800 mg via ORAL
  Filled 2020-12-27: qty 2

## 2020-12-27 MED ORDER — POTASSIUM CHLORIDE CRYS ER 20 MEQ PO TBCR
40.0000 meq | EXTENDED_RELEASE_TABLET | Freq: Once | ORAL | Status: AC
  Administered 2020-12-27: 40 meq via ORAL
  Filled 2020-12-27: qty 2

## 2020-12-27 NOTE — Discharge Instructions (Addendum)
You were evaluated in the Emergency Department and after careful evaluation, we did not find any emergent condition requiring admission or further testing in the hospital.  Your exam/testing today was overall reassuring.  Your CT scan showed a nodule in your lung which needs to be reevaluated in a few weeks.  Please tell this to your primary care doctor so they can schedule repeat imaging.  Your potassium levels were low.  Please take the potassium prescription as directed.  Please return to the Emergency Department if you experience any worsening of your condition.  Thank you for allowing [place] to be a part of your care.

## 2020-12-27 NOTE — ED Notes (Signed)
Date and time results received: 12/27/20 2103  Test: potassium Critical Value: 2.7  Name of Provider Notified: Pilar Plate, MD  Orders Received? Or Actions Taken?: acknowledged

## 2020-12-27 NOTE — ED Notes (Signed)
Pt in bed, sig other at bedside, sig other states that pt was talking out of her head this am and he brought her in, pt oriented to person, place and day of the week, pt states that she is on her second round of chemo and has some problems with her memory, pt moving all extremities, resps even and unlabored, no facial droop or slurred speech noted.

## 2020-12-27 NOTE — ED Notes (Signed)
Pt placed on 2L O2 via [place], pt satting mid 90s on 2L

## 2020-12-27 NOTE — ED Provider Notes (Signed)
[facility] Emergency Department Provider Note MRN:  [id number]  Arrival date & time: 12/27/20     Chief Complaint   Altered Mental Status   History of Present Illness   Brenda Mclean is a 75 y.o. year-old female with a history of rectal cancer presenting to the ED with chief complaint of altered mental status.  Patient with confusion and abnormal behavior today.  Was acting normal yesterday.  Try to leave the house today not wearing enough clothing, went into the car and sat in the backseat for a while.  Other abnormal behaviors noted.  No falls recently, no headache or vision change, no fever or cough, no neck or back pain, no chest pain or shortness of breath, no abdominal pain, no numbness or weakness to the arms or legs.  Review of Systems  A complete 10 system review of systems was obtained and all systems are negative except as noted in the HPI and PMH.   Patient's Health History    Past Medical History:  Diagnosis Date  . Arthritis   . COVID-19 03/2019  . GERD (gastroesophageal reflux disease)   . HLD (hyperlipidemia)   . HTN (hypertension)   . Port-A-Cath in place 09/09/2020  . 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 Sigmoid diverticulitis [id number]   Uncomplicated    Past Surgical History:  Procedure Laterality Date  . BIOPSY  07/10/2020   Procedure: BIOPSY;  Surgeon: [name], MD;  Location: [facility];  Service: Endoscopy;;  . CHOLECYSTECTOMY  1998  . COLON SURGERY  10/30/2012   Dr. Audrie Lia; transanal resection of 2 cm rectal polyp; pathology revealed superficially invasive moderately differentiated adenocarcinoma arising in adenomatous polyp of distal rectum.  . COLONOSCOPY  10/24/2013   Dr. West Carbo; with propofol; small granuloma and sutures distal rectum, small polyp mid rectum removed, sigmoid diverticulosis, otherwise normal exam.  No pathology received.  Recommended colonoscopy in 5 years.  . COLONOSCOPY  09/08/2012   Dr.  West Carbo; with propofol; multi lobulated friable 3 cm mass distal rectum s/p multiple biopsies, scattered sigmoid diverticulosis, otherwise normal exam.  Pathology with adenomatous polyp.  . COLONOSCOPY WITH PROPOFOL N/A 07/10/2020   Procedure: COLONOSCOPY WITH PROPOFOL;  Surgeon: [name], MD;  Location: [facility];  Service: Endoscopy;  Laterality: N/A;  10:00am  . POLYPECTOMY  07/10/2020   Procedure: POLYPECTOMY;  Surgeon: [name], MD;  Location: [facility];  Service: Endoscopy;;  . PORTACATH PLACEMENT Left 09/09/2020   Procedure: INSERTION PORT-A-CATH;  Surgeon: Aviva Signs, MD;  Location: [facility] ORS;  Service: General;  Laterality: Left;  . REPLACEMENT TOTAL HIP W/  RESURFACING IMPLANTS Bilateral   . 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 REPLACEMENT TOTAL KNEE Right 12/03/2014  . TOTAL SHOULDER ARTHROPLASTY  04/26/2018    Family History  Problem Relation Age of Onset  . Arthritis Mother   . High blood pressure Mother   . Heart disease Father   . Breast cancer Maternal Aunt   . Stroke Maternal Grandmother   . Cancer Paternal Grandmother   . Diabetes Brother   . Colon cancer Neg Hx     Social History   Socioeconomic History  . Marital status: Married    Spouse name: Not on file  . Number of children: 2  . Years of education: Not on file  . Highest education level: Not on file  Occupational History    Employer: retired  Tobacco Use  . Smoking status: Never Smoker  .  Smokeless tobacco: Never Used  Vaping Use  . Vaping Use: Never used  Substance and Sexual Activity  . Alcohol use: Never  . Drug use: Never  . Sexual activity: Not Currently  Other Topics Concern  . Not on file  Social History Narrative  . Not on file   Social Determinants of Health   Financial Resource Strain: Low Risk   . Difficulty of Paying Living Expenses: Not hard at all  Food Insecurity: No Food Insecurity  . Worried About Charity fundraiser in the Last Year: Never true   . Ran Out of Food in the Last Year: Never true  Transportation Needs: No Transportation Needs  . Lack of Transportation (Medical): No  . Lack of Transportation (Non-Medical): No  Physical Activity: Inactive  . Days of Exercise per Week: 0 days  . Minutes of Exercise per Session: 0 min  Stress: No Stress Concern Present  . Feeling of Stress : Only a little  Social Connections: Moderately Integrated  . Frequency of Communication with Friends and Family: More than three times a week  . Frequency of Social Gatherings with Friends and Family: More than three times a week  . Attends Religious Services: More than 4 times per year  . Active Member of Clubs or Organizations: No  . Attends Archivist Meetings: Never  . Marital Status: Married  Human resources officer Violence: Not At Risk  . Fear of Current or Ex-Partner: No  . Emotionally Abused: No  . Physically Abused: No  . Sexually Abused: No     Physical Exam   Vitals:   12/27/20 2030 12/27/20 2140  BP: 115/69 102/69  Pulse: 66 74  Resp: 19 18  Temp:    SpO2: 96% 99%    CONSTITUTIONAL: Well-appearing, NAD NEURO:  Alert and oriented x 3, no focal deficits EYES:  eyes equal and reactive ENT/NECK:  no LAD, no JVD CARDIO: Regular rate, well-perfused, normal S1 and S2 PULM:  CTAB no wheezing or rhonchi GI/GU:  normal bowel sounds, non-distended, non-tender MSK/SPINE:  No gross deformities, no edema SKIN:  no rash, atraumatic PSYCH:  Appropriate speech and behavior  *Additional and/or pertinent findings included in MDM below  Diagnostic and Interventional Summary    EKG Interpretation  Date/Time:    Ventricular Rate:    PR Interval:    QRS Duration:   QT Interval:    QTC Calculation:   R Axis:     Text Interpretation:        Labs Reviewed  CBC WITH DIFFERENTIAL/PLATELET - Abnormal; Notable for the following components:      Result Value   WBC 3.2 (*)    RBC 3.06 (*)    Hemoglobin 10.3 (*)    HCT 32.2 (*)     MCV 105.2 (*)    RDW 19.9 (*)    Platelets 135 (*)    Neutro Abs 0.9 (*)    All other components within normal limits  BASIC METABOLIC PANEL - Abnormal; Notable for the following components:   Potassium 2.7 (*)    Glucose, Bld 101 (*)    All other components within normal limits  URINALYSIS, ROUTINE W REFLEX MICROSCOPIC    CT HEAD WO CONTRAST  Final Result    CT Chest W Contrast  Final Result    DG Chest Port 1 View  Final Result      Medications  potassium chloride SA (KLOR-CON) CR tablet 40 mEq (40 mEq Oral Given 12/27/20 2139)  magnesium oxide (MAG-OX) tablet 800 mg (800 mg Oral Given 12/27/20 2139)  iohexol (OMNIPAQUE) 300 MG/ML solution 75 mL (75 mLs Intravenous Contrast Given 12/27/20 2156)     Procedures  /  Critical Care Procedures  ED Course and Medical Decision Making  I have reviewed the triage vital signs, the nursing notes, and pertinent available records from the EMR.  Listed above are laboratory and imaging tests that I personally ordered, reviewed, and interpreted and then considered in my medical decision making (see below for details).  Confusion, history of cancer will obtain CT head to exclude metastatic disease.  Also considering metabolic disarray, UTI, work-up pending.     Work-up thus far reassuring.  Question of nodule on chest x-ray followed by CT, again revealing a nodule of questionable significance, this finding mentioned to patient will follow-up with PCP.  Labs reassuring, awaiting urinalysis.  Patient has exhibited no confusion here per husband who is comfortable bringing her home.  Signed out to oncoming provider.  Would treat with Keflex.  Elmer Sow. Pilar Plate, MD [facility] Emergency Medicine [facility] [email or web address]  Final Clinical Impressions(s) / ED Diagnoses     ICD-10-CM   1. Lung nodule seen on imaging study  R91.1   2. Confusion state  F44.89     ED Discharge Orders    None       Discharge Instructions  Discussed with and Provided to Patient:     Discharge Instructions     You were evaluated in the Emergency Department and after careful evaluation, we did not find any emergent condition requiring admission or further testing in the hospital.  Your exam/testing today was overall reassuring.  Your CT scan showed a nodule in your lung which needs to be reevaluated in a few weeks.  Please tell this to your primary care doctor so they can schedule repeat imaging.  Please return to the Emergency Department if you experience any worsening of your condition.  Thank you for allowing [place] to be a part of your care.        Sabas Sous, MD 12/27/20 [phone]

## 2020-12-27 NOTE — ED Notes (Signed)
Pt in bed, sig other at bedside, port accessed, pt has power port and card to verify, flush and positive blood draw.  Pt tolerated well.

## 2020-12-27 NOTE — ED Triage Notes (Signed)
Pt confused and with odd behavior per husband.  Per husband pt has not been normal for past 6 weeks when pt started cancer treatments.  Pt on chemo for colon cancer.  Confusion has gotten worse today.

## 2020-12-27 NOTE — ED Triage Notes (Signed)
Husband reports that pt with generalized weakness- denies any one sided weakness.

## 2020-12-28 ENCOUNTER — Other Ambulatory Visit: Payer: Self-pay

## 2020-12-28 DIAGNOSIS — F4489 Other dissociative and conversion disorders: Secondary | ICD-10-CM | POA: Diagnosis not present

## 2020-12-28 LAB — URINALYSIS, ROUTINE W REFLEX MICROSCOPIC
Bacteria, UA: NONE SEEN
Bilirubin Urine: NEGATIVE
Glucose, UA: NEGATIVE mg/dL
Hgb urine dipstick: NEGATIVE
Ketones, ur: NEGATIVE mg/dL
Leukocytes,Ua: NEGATIVE
Nitrite: NEGATIVE
Protein, ur: 30 mg/dL — AB
Specific Gravity, Urine: 1.025 (ref 1.005–1.030)
pH: 6 (ref 5.0–8.0)

## 2020-12-28 LAB — SARS CORONAVIRUS 2 (TAT 6-24 HRS): SARS Coronavirus 2: NEGATIVE

## 2020-12-28 MED ORDER — HEPARIN SOD (PORK) LOCK FLUSH 100 UNIT/ML IV SOLN
500.0000 [IU] | Freq: Once | INTRAVENOUS | Status: AC
  Administered 2020-12-28: 500 [IU]
  Filled 2020-12-28: qty 5

## 2020-12-28 NOTE — ED Notes (Signed)
Patient discharged to home.  PowerPort deaccessed.  All discharge instructions reviewed.  Patient caregiver able to verbalize understanding via teachback method.  Wheelchair out of ED.

## 2020-12-28 NOTE — ED Provider Notes (Signed)
Plan at signout was to follow-up with urinalysis.  No signs of UTI.  Patient is awake alert in no acute distress.  She is requesting discharge home.  Covid testing is pending at this time      Brenda Rhine, MD 12/28/20 [phone]

## 2020-12-28 NOTE — ED Notes (Signed)
Patient placed on purewick to obtain urine specimen.

## 2020-12-31 ENCOUNTER — Inpatient Hospital Stay (HOSPITAL_COMMUNITY): Payer: Medicare Other | Attending: Hematology

## 2020-12-31 ENCOUNTER — Inpatient Hospital Stay (HOSPITAL_BASED_OUTPATIENT_CLINIC_OR_DEPARTMENT_OTHER): Payer: Medicare Other | Admitting: Hematology

## 2020-12-31 ENCOUNTER — Inpatient Hospital Stay (HOSPITAL_COMMUNITY): Payer: Medicare Other

## 2020-12-31 ENCOUNTER — Other Ambulatory Visit: Payer: Self-pay

## 2020-12-31 VITALS — BP 130/76 | HR 72 | Temp 97.1°F | Resp 18 | Wt 164.3 lb

## 2020-12-31 VITALS — BP 127/71 | HR 61 | Temp 97.1°F | Resp 18

## 2020-12-31 DIAGNOSIS — Z5189 Encounter for other specified aftercare: Secondary | ICD-10-CM | POA: Insufficient documentation

## 2020-12-31 DIAGNOSIS — Z5111 Encounter for antineoplastic chemotherapy: Secondary | ICD-10-CM | POA: Insufficient documentation

## 2020-12-31 DIAGNOSIS — C20 Malignant neoplasm of rectum: Secondary | ICD-10-CM

## 2020-12-31 DIAGNOSIS — Z95828 Presence of other vascular implants and grafts: Secondary | ICD-10-CM

## 2020-12-31 DIAGNOSIS — Z452 Encounter for adjustment and management of vascular access device: Secondary | ICD-10-CM | POA: Diagnosis not present

## 2020-12-31 DIAGNOSIS — E876 Hypokalemia: Secondary | ICD-10-CM | POA: Diagnosis not present

## 2020-12-31 DIAGNOSIS — R197 Diarrhea, unspecified: Secondary | ICD-10-CM | POA: Insufficient documentation

## 2020-12-31 LAB — COMPREHENSIVE METABOLIC PANEL
ALT: 19 U/L (ref 0–44)
AST: 24 U/L (ref 15–41)
Albumin: 3.7 g/dL (ref 3.5–5.0)
Alkaline Phosphatase: 98 U/L (ref 38–126)
Anion gap: 10 (ref 5–15)
BUN: 13 mg/dL (ref 8–23)
CO2: 22 mmol/L (ref 22–32)
Calcium: 9.3 mg/dL (ref 8.9–10.3)
Chloride: 106 mmol/L (ref 98–111)
Creatinine, Ser: 0.81 mg/dL (ref 0.44–1.00)
GFR, Estimated: >60 mL/min (ref >60)
Glucose, Bld: 91 mg/dL (ref 70–99)
Potassium: 4 mmol/L (ref 3.5–5.1)
Sodium: 138 mmol/L (ref 135–145)
Total Bilirubin: 0.6 mg/dL (ref 0.3–1.2)
Total Protein: 6.9 g/dL (ref 6.5–8.1)

## 2020-12-31 LAB — CBC WITH DIFFERENTIAL/PLATELET
Abs Immature Granulocytes: 0.16 10*3/uL — ABNORMAL HIGH (ref 0.00–0.07)
Basophils Absolute: 0.1 10*3/uL (ref 0.0–0.1)
Basophils Relative: 1 %
Eosinophils Absolute: 0.1 10*3/uL (ref 0.0–0.5)
Eosinophils Relative: 3 %
HCT: 34.7 % — ABNORMAL LOW (ref 36.0–46.0)
Hemoglobin: 11.1 g/dL — ABNORMAL LOW (ref 12.0–15.0)
Immature Granulocytes: 3 %
Lymphocytes Relative: 28 %
Lymphs Abs: 1.5 10*3/uL (ref 0.7–4.0)
MCH: 34 pg (ref 26.0–34.0)
MCHC: 32 g/dL (ref 30.0–36.0)
MCV: 106.4 fL — ABNORMAL HIGH (ref 80.0–100.0)
Monocytes Absolute: 0.8 10*3/uL (ref 0.1–1.0)
Monocytes Relative: 14 %
Neutro Abs: 2.7 10*3/uL (ref 1.7–7.7)
Neutrophils Relative %: 51 %
Platelets: 173 10*3/uL (ref 150–400)
RBC: 3.26 MIL/uL — ABNORMAL LOW (ref 3.87–5.11)
RDW: 18.6 % — ABNORMAL HIGH (ref 11.5–15.5)
WBC: 5.3 10*3/uL (ref 4.0–10.5)
nRBC: 0 % (ref 0.0–0.2)

## 2020-12-31 MED ORDER — PALONOSETRON HCL INJECTION 0.25 MG/5ML
0.2500 mg | Freq: Once | INTRAVENOUS | Status: AC
  Administered 2020-12-31: 0.25 mg via INTRAVENOUS
  Filled 2020-12-31: qty 5

## 2020-12-31 MED ORDER — SODIUM CHLORIDE 0.9 % IV SOLN
10.0000 mg | Freq: Once | INTRAVENOUS | Status: AC
  Administered 2020-12-31: 10 mg via INTRAVENOUS
  Filled 2020-12-31: qty 10

## 2020-12-31 MED ORDER — LEUCOVORIN CALCIUM INJECTION 350 MG
300.0000 mg/m2 | Freq: Once | INTRAVENOUS | Status: AC
  Administered 2020-12-31: 552 mg via INTRAVENOUS
  Filled 2020-12-31: qty 27.6

## 2020-12-31 MED ORDER — DEXTROSE 5 % IV SOLN: Freq: Once | INTRAVENOUS | Status: AC

## 2020-12-31 MED ORDER — FLUOROURACIL CHEMO INJECTION 2.5 GM/50ML
300.0000 mg/m2 | Freq: Once | INTRAVENOUS | Status: AC
  Administered 2020-12-31: 550 mg via INTRAVENOUS
  Filled 2020-12-31: qty 11

## 2020-12-31 MED ORDER — SODIUM CHLORIDE 0.9 % IV SOLN
1800.0000 mg/m2 | INTRAVENOUS | Status: DC
  Administered 2020-12-31: 3300 mg via INTRAVENOUS
  Filled 2020-12-31: qty 66

## 2020-12-31 MED ORDER — OXALIPLATIN CHEMO INJECTION 100 MG/20ML
55.0000 mg/m2 | Freq: Once | INTRAVENOUS | Status: AC
  Administered 2020-12-31: 100 mg via INTRAVENOUS
  Filled 2020-12-31: qty 20

## 2020-12-31 NOTE — Progress Notes (Signed)
Patient assessed and labs reviewed by Dr. Ellin Saba. Okay to proceed with treatment today.

## 2020-12-31 NOTE — Progress Notes (Signed)
Tolerated infusions w/o adverse reaction.  Alert, in no distress.  VSS.  Discharged ambulatory in stable condition with home ambulatory pump infusing as ordered.  

## 2020-12-31 NOTE — Patient Instructions (Signed)
[facility] at [facility] Discharge Instructions  You were seen today by Dr. Ellin Saba. He went over your recent results. You received your final chemo treatment today. You will be referred to radiation oncology in [facility] for further treatments. Dr. Ellin Saba will see you back in 3 weeks for labs and follow up.   Thank you for choosing [facility] at [facility] to provide your oncology and hematology care.  To afford each patient quality time with our provider, please arrive at least 15 minutes before your scheduled appointment time.   If you have a lab appointment with the Cancer Center please come in thru the Main Entrance and check in at the main information desk  You need to re-schedule your appointment should you arrive 10 or more minutes late.  We strive to give you quality time with our providers, and arriving late affects you and other patients whose appointments are after yours.  Also, if you no show three or more times for appointments you may be dismissed from the clinic at the providers discretion.     Again, thank you for choosing [facility].  Our hope is that these requests will decrease the amount of time that you wait before being seen by our physicians.       _____________________________________________________________  Should you have questions after your visit to [facility], please contact our office at [phone] between the hours of 8:00 a.m. and 4:30 p.m.  Voicemails left after 4:00 p.m. will not be returned until the following business day.  For prescription refill requests, have your pharmacy contact our office and allow 72 hours.    Cancer Center Support Programs:   > Cancer Support Group  2nd Tuesday of the month 1pm-2pm, Journey Room

## 2020-12-31 NOTE — Progress Notes (Signed)
[facility] [address] [place], [place] [address]   CLINIC:  Medical Oncology/Hematology  PCP:  Virgina NorfolkBarker, Wendy L, MD [address] [place] / [place] [phone]   REASON FOR VISIT:  Follow-up for rectal cancer  PRIOR THERAPY: Transanal resection of polyp in 10/2012  NGS Results: Not done  CURRENT THERAPY: FOLFOX & Aloxi every 2 weeks  BRIEF ONCOLOGIC HISTORY:  Oncology History  Rectal cancer (HCC)  08/14/2020 Initial Diagnosis   Rectal cancer (HCC)   09/02/2020 Cancer Staging   Staging form: Colon and Rectum, AJCC 8th Edition - Clinical stage from 09/02/2020: Stage IIA (cT3, cN0, cM0) - Signed by Doreatha MassedKatragadda, Montay Vanvoorhis, MD on 09/02/2020   09/16/2020 -  Chemotherapy   The patient had palonosetron (ALOXI) injection 0.25 mg, 0.25 mg, Intravenous,  Once, 6 of 8 cycles Administration: 0.25 mg (09/16/2020), 0.25 mg (10/07/2020), 0.25 mg (10/21/2020), 0.25 mg (11/04/2020), 0.25 mg (11/26/2020), 0.25 mg (12/10/2020) pegfilgrastim-cbqv (UDENYCA) injection 6 mg, 6 mg, Subcutaneous, Once, 5 of 7 cycles Administration: 6 mg (10/09/2020), 6 mg (10/23/2020), 6 mg (11/06/2020) leucovorin 700 mg in dextrose 5 % 250 mL infusion, 380 mg/m2 = 736 mg, Intravenous,  Once, 6 of 8 cycles Dose modification: 300 mg/m2 (original dose 400 mg/m2, Cycle 6, Reason: Provider Judgment) Administration: 700 mg (09/16/2020), 700 mg (10/07/2020), 700 mg (10/21/2020), 700 mg (11/04/2020), 700 mg (11/26/2020), 552 mg (12/10/2020) oxaliplatin (ELOXATIN) 125 mg in dextrose 5 % 500 mL chemo infusion, 68 mg/m2 = 125 mg (80 % of original dose 85 mg/m2), Intravenous,  Once, 6 of 8 cycles Dose modification: 68 mg/m2 (80 % of original dose 85 mg/m2, Cycle 1, Reason: Provider Judgment), 68 mg/m2 (80 % of original dose 85 mg/m2, Cycle 2, Reason: Provider Judgment), 55 mg/m2 (original dose 85 mg/m2, Cycle 4, Reason: Other (see comments), Comment: neuropathy) Administration: 125 mg (09/16/2020), 125 mg  (10/07/2020), 125 mg (10/21/2020), 100 mg (11/04/2020), 100 mg (11/26/2020), 100 mg (12/10/2020) fluorouracil (ADRUCIL) chemo injection 750 mg, 400 mg/m2 = 750 mg, Intravenous,  Once, 6 of 8 cycles Dose modification: 300 mg/m2 (original dose 400 mg/m2, Cycle 6, Reason: Provider Judgment) Administration: 750 mg (09/16/2020), 750 mg (10/07/2020), 750 mg (10/21/2020), 750 mg (11/04/2020), 750 mg (11/26/2020), 550 mg (12/10/2020) fluorouracil (ADRUCIL) 4,400 mg in sodium chloride 0.9 % 62 mL chemo infusion, 2,400 mg/m2 = 4,400 mg, Intravenous, 1 Day/Dose, 6 of 8 cycles Dose modification: 1,920 mg/m2 (80 % of original dose 2,400 mg/m2, Cycle 5, Reason: Other (see comments), Comment: diarrhea), 1,800 mg/m2 (original dose 2,400 mg/m2, Cycle 6, Reason: Provider Judgment) Administration: 4,400 mg (09/16/2020), 4,400 mg (10/07/2020), 4,400 mg (10/21/2020), 4,400 mg (11/04/2020), 3,550 mg (11/26/2020), 3,300 mg (12/10/2020)  for chemotherapy treatment.      CANCER STAGING: Cancer Staging Rectal cancer [facility](HCC) Staging form: Colon and Rectum, AJCC 8th Edition - Clinical stage from 09/02/2020: Stage IIA (cT3, cN0, cM0) - Signed by Doreatha MassedKatragadda, Meliyah Simon, MD on 09/02/2020   INTERVAL HISTORY:  Brenda Mclean, a 75 y.o. female, returns for routine follow-up and consideration for final cycle of chemotherapy. Brenda RhodesBetty was last seen on 12/23/2020.  Due for cycle #7 of FOLFOX and Aloxi today.   Overall, she tells me she has been feeling okay. She was brought to [facility] on 12/27/2020 for AMS, though she remembers very little about the episode. Her energy levels are still decreased, though she is feeling slightly better. She has to use a cane to walk since she feels unsteady on her feet and feels  cool in her feet occasionally. She is taking 2 tablets of potassium daily. She denies having any numbness or tingling in her hands or feet. Her appetite is excellent and she denies abdominal pain, N/V, hematuria or hematochezia.  Overall,  she feels ready for final cycle of chemo today. She wants to proceed with radiation in [place].   REVIEW OF SYSTEMS:  Review of Systems  Constitutional: Positive for fatigue (50%). Negative for appetite change.  Gastrointestinal: Positive for diarrhea. Negative for abdominal pain, blood in stool, nausea and vomiting.  Genitourinary: Negative for hematuria.   Musculoskeletal: Positive for gait problem (unsteady on feet; ambulates with cane).  Neurological: Positive for gait problem (unsteady on feet; ambulates with cane). Negative for numbness.  All other systems reviewed and are negative.   PAST MEDICAL/SURGICAL HISTORY:  Past Medical History:  Diagnosis Date  . Arthritis   . COVID-19 03/2019  . GERD (gastroesophageal reflux disease)   . HLD (hyperlipidemia)   . HTN (hypertension)   . Port-A-Cath in place 09/09/2020  . 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 Sigmoid diverticulitis 2016   Uncomplicated   Past Surgical History:  Procedure Laterality Date  . BIOPSY  07/10/2020   Procedure: BIOPSY;  Surgeon: Corbin Ade, MD;  [facility];  Service: Endoscopy;;  . CHOLECYSTECTOMY  1998  . COLON SURGERY  10/30/2012   Dr. Morey Hummingbird; transanal resection of 2 cm rectal polyp; pathology revealed superficially invasive moderately differentiated adenocarcinoma arising in adenomatous polyp of distal rectum.  . COLONOSCOPY  10/24/2013   Dr. Aleene Davidson; with propofol; small granuloma and sutures distal rectum, small polyp mid rectum removed, sigmoid diverticulosis, otherwise normal exam.  No pathology received.  Recommended colonoscopy in 5 years.  . COLONOSCOPY  09/08/2012   Dr. Aleene Davidson; with propofol; multi lobulated friable 3 cm mass distal rectum s/p multiple biopsies, scattered sigmoid diverticulosis, otherwise normal exam.  Pathology with adenomatous polyp.  . COLONOSCOPY WITH PROPOFOL N/A 07/10/2020   Procedure: COLONOSCOPY WITH PROPOFOL;  Surgeon: Corbin Ade, MD;   Location: [facility];  Service: Endoscopy;  Laterality: N/A;  10:00am  . POLYPECTOMY  07/10/2020   Procedure: POLYPECTOMY;  Surgeon: Corbin Ade, MD;  Location[facility];  Service: Endoscopy;;  . PORTACATH PLACEMENT Left 09/09/2020   Procedure: INSERTION PORT-A-CATH;  Surgeon: Franky Macho, MD;  Location: [facility];  Service: General;  Laterality: Left;  . REPLACEMENT TOTAL HIP W/  RESURFACING IMPLANTS Bilateral   . 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 REPLACEMENT TOTAL KNEE Right 12/03/2014  . TOTAL SHOULDER ARTHROPLASTY  04/26/2018    SOCIAL HISTORY:  Social History   Socioeconomic History  . Marital status: Married    Spouse name: Not on file  . Number of children: 2  . Years of education: Not on file  . Highest education level: Not on file  Occupational History    Employer: retired  Tobacco Use  . Smoking status: Never Smoker  . Smokeless tobacco: Never Used  Vaping Use  . Vaping Use: Never used  Substance and Sexual Activity  . Alcohol use: Never  . Drug use: Never  . Sexual activity: Not Currently  Other Topics Concern  . Not on file  Social History Narrative  . Not on file   Social Determinants of Health   Financial Resource Strain: Low Risk   . Difficulty of Paying Living Expenses: Not hard at all  Food Insecurity: No Food Insecurity  . Worried About Programme researcher, broadcasting/film/video in  the Last Year: Never true  . Ran Out of Food in the Last Year: Never true  Transportation Needs: No Transportation Needs  . Lack of Transportation (Medical): No  . Lack of Transportation (Non-Medical): No  Physical Activity: Inactive  . Days of Exercise per Week: 0 days  . Minutes of Exercise per Session: 0 min  Stress: No Stress Concern Present  . Feeling of Stress : Only a little  Social Connections: Moderately Integrated  . Frequency of Communication with Friends and Family: More than three times a week  . Frequency of Social Gatherings with Friends and Family: More than  three times a week  . Attends Religious Services: More than 4 times per year  . Active Member of Clubs or Organizations: No  . Attends Archivist Meetings: Never  . Marital Status: Married  Human resources officer Violence: Not At Risk  . Fear of Current or Ex-Partner: No  . Emotionally Abused: No  . Physically Abused: No  . Sexually Abused: No    FAMILY HISTORY:  Family History  Problem Relation Age of Onset  . Arthritis Mother   . High blood pressure Mother   . Heart disease Father   . Breast cancer Maternal Aunt   . Stroke Maternal Grandmother   . Cancer Paternal Grandmother   . Diabetes Brother   . Colon cancer Neg Hx     CURRENT MEDICATIONS:  Current Outpatient Medications  Medication Sig Dispense Refill  . celecoxib (CELEBREX) 200 MG capsule Take 200 mg by mouth daily.    Marland Kitchen dexamethasone 10 mg in sodium chloride 0.9 % 50 mL Inject 10 mg into the vein every 14 (fourteen) days. Prior to chemotherapy administration    . feeding supplement (ENSURE ENLIVE / ENSURE PLUS) LIQD Take 237 mLs by mouth 2 (two) times daily between meals. 237 mL 12  . fluorouracil CALGB 13086 in sodium chloride 0.9 % 150 mL Inject 2,400 mg/m2 into the vein over 48 hr.    . FLUOROURACIL IV Inject 400 mg/m2 into the vein every 14 (fourteen) days.    Marland Kitchen LEUCOVORIN CALCIUM IV Inject 400 mg/m2 into the vein every 14 (fourteen) days.    Marland Kitchen lidocaine-prilocaine (EMLA) cream Apply a small amount to port a cath site and cover with plastic wrap 1 hour prior to chemotherapy appointments 30 g 3  . omega-3 acid ethyl esters (LOVAZA) 1 g capsule Take 2 g by mouth daily.    Marland Kitchen omeprazole (PRILOSEC) 20 MG capsule Take 20 mg by mouth daily.    . OXALIPLATIN IV Inject 85 mg/m2 into the vein every 14 (fourteen) days.    . palonosetron (ALOXI) 0.25 MG/5ML injection Inject 0.25 mg into the vein every 14 (fourteen) days. Prior to chemotherapy administration    . potassium chloride SA (KLOR-CON) 20 MEQ tablet Take 1 tablet  (20 mEq total) by mouth 2 (two) times daily for 5 days. 10 tablet 0  . prochlorperazine (COMPAZINE) 10 MG tablet Take 1 tablet (10 mg total) by mouth every 6 (six) hours as needed (Nausea or vomiting). 30 tablet 1  . simvastatin (ZOCOR) 40 MG tablet Take 40 mg by mouth at bedtime.     . traMADol (ULTRAM) 50 MG tablet Take 1 tablet (50 mg total) by mouth every 6 (six) hours as needed. 20 tablet 0  . Turmeric 500 MG TABS Take 500 mg by mouth daily.    . valsartan (DIOVAN) 320 MG tablet Take 320 mg by mouth daily.  No current facility-administered medications for this visit.   Facility-Administered Medications Ordered in Other Visits  Medication Dose Route Frequency Provider Last Rate Last Admin  . heparin lock flush 100 unit/mL  500 Units Intracatheter Once PRN [name], MD      . sodium chloride flush (NS) 0.9 % injection 10 mL  10 mL Intracatheter PRN [name], MD   10 mL at 11/26/20 0950    ALLERGIES:  No Known Allergies  PHYSICAL EXAM:  Performance status (ECOG): 0 - Asymptomatic  Vitals:   12/31/20 0756  BP: 130/76  Pulse: 72  Resp: 18  Temp: (!) 97.1 F (36.2 C)  SpO2: 97%   Wt Readings from Last 3 Encounters:  12/31/20 164 lb 4.8 oz (74.5 kg)  12/27/20 160 lb (72.6 kg)  12/23/20 167 lb 8.8 oz (76 kg)   Physical Exam Vitals reviewed.  Constitutional:      Appearance: Normal appearance. She is obese.  Cardiovascular:     Rate and Rhythm: Normal rate and regular rhythm.     Pulses: Normal pulses.     Heart sounds: Normal heart sounds.  Pulmonary:     Effort: Pulmonary effort is normal.     Breath sounds: Normal breath sounds.  Chest:     Comments: Port-a-Cath in L chest Abdominal:     Palpations: Abdomen is soft. There is no mass.     Tenderness: There is no abdominal tenderness.  Musculoskeletal:     Right lower leg: No edema.     Left lower leg: No edema.  Neurological:     General: No focal deficit present.     Mental Status: She  is alert and oriented to person, place, and time.  Psychiatric:        Mood and Affect: Mood normal.        Behavior: Behavior normal.     LABORATORY DATA:  I have reviewed the labs as listed.  CBC Latest Ref Rng & Units 12/31/2020 12/27/2020 12/23/2020  WBC 4.0 - 10.5 K/uL 5.3 3.2(L) 1.9(L)  Hemoglobin 12.0 - 15.0 g/dL 11.1(L) 10.3(L) 10.3(L)  Hematocrit 36.0 - 46.0 % 34.7(L) 32.2(L) 31.2(L)  Platelets 150 - 400 K/uL 173 135(L) 147(L)   CMP Latest Ref Rng & Units 12/31/2020 12/27/2020 12/23/2020  Glucose 70 - 99 mg/dL 91 101(H) 105(H)  BUN 8 - 23 mg/dL 13 13 13   Creatinine 0.44 - 1.00 mg/dL 0.81 0.82 0.89  Sodium 135 - 145 mmol/L 138 138 140  Potassium 3.5 - 5.1 mmol/L 4.0 2.7(LL) 2.7(LL)  Chloride 98 - 111 mmol/L 106 103 106  CO2 22 - 32 mmol/L 22 24 23   Calcium 8.9 - 10.3 mg/dL 9.3 8.9 8.9  Total Protein 6.5 - 8.1 g/dL 6.9 - 6.7  Total Bilirubin 0.3 - 1.2 mg/dL 0.6 - 0.8  Alkaline Phos 38 - 126 U/L 98 - 90  AST 15 - 41 U/L 24 - 31  ALT 0 - 44 U/L 19 - 27    DIAGNOSTIC IMAGING:  I have independently reviewed the scans and discussed with the patient. CT HEAD WO CONTRAST  Result Date: 12/27/2020 CLINICAL DATA:  Delirium EXAM: CT HEAD WITHOUT CONTRAST TECHNIQUE: Contiguous axial images were obtained from the base of the skull through the vertex without intravenous contrast. COMPARISON:  None. FINDINGS: Brain: No acute territorial infarction, hemorrhage or intracranial mass. Mild atrophy. Moderate hypodensity in the white matter consistent with chronic small vessel ischemic change. The ventricles are nonenlarged. Vascular: No hyperdense vessels. Scattered carotid  vascular calcification Skull: Normal. Negative for fracture or focal lesion. Sinuses/Orbits: Mucosal thickening in the sphenoid sinuses Other: None IMPRESSION: 1. No CT evidence for acute intracranial abnormality. 2. Atrophy and chronic small vessel ischemic changes of the white matter. Electronically Signed   By: Donavan Foil M.D.    On: 12/27/2020 22:14   CT Chest W Contrast  Result Date: 12/27/2020 CLINICAL DATA:  Abnormal nodule history of rectal cancer EXAM: CT CHEST WITH CONTRAST TECHNIQUE: Multidetector CT imaging of the chest was performed during intravenous contrast administration. CONTRAST:  53mL OMNIPAQUE IOHEXOL 300 MG/ML  SOLN COMPARISON:  Radiograph same day, August 05, 2020 FINDINGS: Cardiovascular: Scattered aortic atherosclerosis is seen. Mild coronary artery calcifications are present. The heart size is normal. There is no pericardial thickening or effusion. Mediastinum/Nodes: There are no enlarged mediastinal, hilar or axillary lymph nodes. The thyroid gland, trachea and esophagus demonstrate no significant findings. Lungs/Pleura: There is an ill-defined spiculated nodular opacity seen within the posterior right upper lobe measuring 1.9 x 1.2 cm. Adjacent ground-glass opacity seen posteriorly within the right upper lobe. A small amount of streaky airspace opacity seen at lung bases. Upper abdomen: The visualized portion of the upper abdomen is unremarkable. Musculoskeletal/Chest wall: There is no chest wall mass or suspicious osseous finding. No acute osseous abnormality IMPRESSION: New ill-defined spiculated nodular opacity within the posterior right upper lobe measuring 1.9 x 1.2 cm which could represent a focus of inflammatory/infectious etiology, however cannot exclude a pulmonary metastatic lesion given the patient's history. would recommend short interval follow up CT exam within 1-2 weeks to determine resolution Electronically Signed   By: Prudencio Pair M.D.   On: 12/27/2020 22:21   DG Chest Port 1 View  Result Date: 12/27/2020 CLINICAL DATA:  Confusion for 6 weeks. On chemotherapy for colon cancer. EXAM: PORTABLE CHEST 1 VIEW COMPARISON:  09/09/2020 FINDINGS: Heart size and pulmonary vascularity are normal. Mild peribronchial thickening in the lungs suggesting chronic bronchitis. Suggestion of a developing nodule  in the right mid lung, measuring 1.3 cm diameter. This could represent vascular artifact, developing nodular metastasis, or focal pneumonia. Consider CT for further evaluation. Left lung is clear. No pleural effusions. No pneumothorax. Power port type central venous catheter with tip over the mid SVC region. Postoperative changes in the left shoulder. Degenerative changes in the spine and right shoulder. Surgical clips in the right upper quadrant. Calcifications in the left upper quadrant. IMPRESSION: 1. Possible developing nodule in the right mid lung. Consider CT for further evaluation. 2. Chronic bronchitic changes in the lungs. Electronically Signed   By: Lucienne Capers M.D.   On: 12/27/2020 19:56     ASSESSMENT:  1. Rectal adenocarcinoma: -History of rectal polyp with adenocarcinoma, status post transanal resection of the polyp in November 2013. -Colonoscopy on July 10, 2020 shows 2.5 cm centrally depressed ulcerated lesion with heaped up circular margin, 8 cm from anal verge. Distally, just above the anal verge, suture line consistent with history of prior transanal polypectomy. -Pathology of the rectal mass consistent with adenocarcinoma. Splenic flexure polypectomy was tubular adenoma. Sigmoid colon biopsy was benign. -CT CAP with contrast on August 05, 2020 shows no evidence of neoplastic disease within the chest, abdomen or pelvis. Complicated left midpole renal cyst versus complex cystic mass. No lung masses. -Pelvis MRI on 08/27/2020 showed T3BN0 tumor. Distance of tumor/node from mesorectal fascia was 1 cm. Distance from tumor to the internal anal sphincter was 4.2 cm. Tumor size is 2.2 x 2.2 cm. -Total neoadjuvant  therapy with 4 months of FOLFOX followed by long course chemo RT with Xeloda followed by restaging pelvis MRI 6 to 8 weeks after completion of XRT followed by resection recommended. -Cycle 1 of FOLFOX on 09/16/2020  2. Social/family history: -She lives at home with her  husband. She worked as a Counselling psychologist for 47 years. No history of smoking. -Maternal aunt had breast cancer.   PLAN:  1.Stage II (T3N0) rectal adenocarcinoma: -Her chemotherapy was held secondary to cytopenias last week. -She went to ER on 12/28/2019 with confusion.  A CT of the head was done which did not show any abnormality. -CT chest with contrast showed ill-defined spiculated nodular opacity within the posterior right upper lobe measuring 1.9 x 1.2 cm with adjacent groundglass opacity seen posteriorly within the right upper lobe.  This could represent a focus of inflammatory/infectious etiology. -Repeat CT exam in 2 weeks is recommended. -Today she is feeling better.  Reviewed labs which showed improved white count and ANC.  LFTs are normal. -We will proceed with her last treatment today.  I will withhold cycle 8 as she is feeling very weak after treatment.  5-FU dose is already dose reduced secondary to hand-foot skin reaction. -As per her wishes, we will make a referral to radiation oncology in [place]. -She will have infusional 5-FU throughout the course of 6 weeks of radiation. -Follow-up with me in 3 weeks with lab check.  2. Left kidney lesion: -MRI was recommended by Dr. Alyson Ingles in 6 months.  3. Hypokalemia:  -Continue potassium 20 mEq daily.  4. Diarrhea: -Continue Imodium for intermittent diarrhea.   Orders placed this encounter:  No orders of the defined types were placed in this encounter.    [name], MD [place] [phone]   I, Milinda Antis, am acting as a scribe for Dr. Sanda Linger.  I, [name] MD, have reviewed the above documentation for accuracy and completeness, and I agree with the above.

## 2021-01-01 ENCOUNTER — Inpatient Hospital Stay (HOSPITAL_COMMUNITY): Payer: Medicare Other

## 2021-01-01 ENCOUNTER — Encounter (HOSPITAL_COMMUNITY): Payer: Self-pay

## 2021-01-01 VITALS — BP 131/68 | HR 69 | Temp 97.8°F | Resp 18

## 2021-01-01 DIAGNOSIS — Z95828 Presence of other vascular implants and grafts: Secondary | ICD-10-CM

## 2021-01-01 DIAGNOSIS — Z5111 Encounter for antineoplastic chemotherapy: Secondary | ICD-10-CM | POA: Diagnosis not present

## 2021-01-01 DIAGNOSIS — C20 Malignant neoplasm of rectum: Secondary | ICD-10-CM

## 2021-01-01 MED ORDER — HEPARIN SOD (PORK) LOCK FLUSH 100 UNIT/ML IV SOLN
500.0000 [IU] | Freq: Once | INTRAVENOUS | Status: AC
  Administered 2021-01-01: 500 [IU] via INTRAVENOUS

## 2021-01-01 MED ORDER — SODIUM CHLORIDE 0.9% FLUSH
10.0000 mL | Freq: Once | INTRAVENOUS | Status: AC
  Administered 2021-01-01: 10 mL via INTRAVENOUS

## 2021-01-01 MED ORDER — PEGFILGRASTIM-CBQV 6 MG/0.6ML ~~LOC~~ SOSY
6.0000 mg | PREFILLED_SYRINGE | Freq: Once | SUBCUTANEOUS | Status: AC
  Administered 2021-01-01: 6 mg via SUBCUTANEOUS
  Filled 2021-01-01: qty 0.6

## 2021-01-01 NOTE — Progress Notes (Signed)
1015-Patient presented to the cancer center stating "her pump came out".  Patient taken to the infusion area and assessed.   Patients port needle intact.  The chemotherapy tubing from the pump connected to the port needle tubing had pulled out.  Patients port flushed easily with good blood return noted.  Denied pain with flush.  No bruising or swelling noted at site.  No s/s of distress noted.  Pharmacy notified.    1020-Reviewed with Dr. Ellin Saba with verbal order ok to discontinue the pump.    1036-Ok to give Udenyca injection today verbal order Dr. Ellin Saba.  Pharmacy notified.    Port needle removed and band aid applied.  VSS and no s/s of distress noted. Patient tolerated injection with no complaints voiced.  Site clean and dry with no bruising or swelling noted at site.  Band aid applied.  Vss with discharge and left in satisfactory condition with no s/s of distress noted.

## 2021-01-02 ENCOUNTER — Encounter (HOSPITAL_COMMUNITY): Payer: Medicare Other

## 2021-01-22 ENCOUNTER — Telehealth (HOSPITAL_COMMUNITY): Payer: Self-pay | Admitting: *Deleted

## 2021-01-27 ENCOUNTER — Telehealth (HOSPITAL_COMMUNITY): Payer: Self-pay

## 2021-01-27 ENCOUNTER — Other Ambulatory Visit (HOSPITAL_COMMUNITY): Payer: Self-pay

## 2021-01-27 DIAGNOSIS — N390 Urinary tract infection, site not specified: Secondary | ICD-10-CM

## 2021-01-27 DIAGNOSIS — C20 Malignant neoplasm of rectum: Secondary | ICD-10-CM

## 2021-01-27 NOTE — Telephone Encounter (Signed)
This nurse received a call from this patients daughter stating patient is having difficulty urinating. Patient and daughter would like to know if she could possibly have a UTI.  This nurse informed patient that since patient has labs and an office visit on 01/28/21 we can add a urinalysis to her lab requests to check for infection.  Orders have been placed.  No further questions or concerns at this time.

## 2021-01-28 ENCOUNTER — Inpatient Hospital Stay (HOSPITAL_COMMUNITY): Payer: Medicare Other | Attending: Hematology | Admitting: Hematology

## 2021-01-28 ENCOUNTER — Other Ambulatory Visit: Payer: Self-pay

## 2021-01-28 ENCOUNTER — Inpatient Hospital Stay (HOSPITAL_COMMUNITY): Payer: Medicare Other

## 2021-01-28 VITALS — BP 139/73 | HR 91 | Temp 96.9°F | Resp 18 | Wt 155.2 lb

## 2021-01-28 DIAGNOSIS — Z95828 Presence of other vascular implants and grafts: Secondary | ICD-10-CM

## 2021-01-28 DIAGNOSIS — C20 Malignant neoplasm of rectum: Secondary | ICD-10-CM

## 2021-01-28 DIAGNOSIS — Z8616 Personal history of COVID-19: Secondary | ICD-10-CM | POA: Diagnosis not present

## 2021-01-28 DIAGNOSIS — I10 Essential (primary) hypertension: Secondary | ICD-10-CM | POA: Insufficient documentation

## 2021-01-28 DIAGNOSIS — E785 Hyperlipidemia, unspecified: Secondary | ICD-10-CM | POA: Diagnosis not present

## 2021-01-28 DIAGNOSIS — R410 Disorientation, unspecified: Secondary | ICD-10-CM | POA: Insufficient documentation

## 2021-01-28 DIAGNOSIS — E876 Hypokalemia: Secondary | ICD-10-CM | POA: Diagnosis not present

## 2021-01-28 DIAGNOSIS — R197 Diarrhea, unspecified: Secondary | ICD-10-CM | POA: Insufficient documentation

## 2021-01-28 DIAGNOSIS — R5383 Other fatigue: Secondary | ICD-10-CM | POA: Insufficient documentation

## 2021-01-28 DIAGNOSIS — R911 Solitary pulmonary nodule: Secondary | ICD-10-CM | POA: Diagnosis not present

## 2021-01-28 DIAGNOSIS — N390 Urinary tract infection, site not specified: Secondary | ICD-10-CM

## 2021-01-28 LAB — CBC WITH DIFFERENTIAL/PLATELET
Abs Immature Granulocytes: 0.03 10*3/uL (ref 0.00–0.07)
Basophils Absolute: 0.1 10*3/uL (ref 0.0–0.1)
Basophils Relative: 1 %
Eosinophils Absolute: 0.1 10*3/uL (ref 0.0–0.5)
Eosinophils Relative: 2 %
HCT: 38.4 % (ref 36.0–46.0)
Hemoglobin: 11.9 g/dL — ABNORMAL LOW (ref 12.0–15.0)
Immature Granulocytes: 1 %
Lymphocytes Relative: 20 %
Lymphs Abs: 1.1 10*3/uL (ref 0.7–4.0)
MCH: 33.5 pg (ref 26.0–34.0)
MCHC: 31 g/dL (ref 30.0–36.0)
MCV: 108.2 fL — ABNORMAL HIGH (ref 80.0–100.0)
Monocytes Absolute: 0.6 10*3/uL (ref 0.1–1.0)
Monocytes Relative: 12 %
Neutro Abs: 3.5 10*3/uL (ref 1.7–7.7)
Neutrophils Relative %: 64 %
Platelets: 183 10*3/uL (ref 150–400)
RBC: 3.55 MIL/uL — ABNORMAL LOW (ref 3.87–5.11)
RDW: 14.9 % (ref 11.5–15.5)
WBC: 5.5 10*3/uL (ref 4.0–10.5)
nRBC: 0 % (ref 0.0–0.2)

## 2021-01-28 LAB — COMPREHENSIVE METABOLIC PANEL
ALT: 17 U/L (ref 0–44)
AST: 26 U/L (ref 15–41)
Albumin: 3.5 g/dL (ref 3.5–5.0)
Alkaline Phosphatase: 89 U/L (ref 38–126)
Anion gap: 7 (ref 5–15)
BUN: 16 mg/dL (ref 8–23)
CO2: 27 mmol/L (ref 22–32)
Calcium: 9.4 mg/dL (ref 8.9–10.3)
Chloride: 107 mmol/L (ref 98–111)
Creatinine, Ser: 0.82 mg/dL (ref 0.44–1.00)
GFR, Estimated: >60 mL/min (ref >60)
Glucose, Bld: 98 mg/dL (ref 70–99)
Potassium: 3.7 mmol/L (ref 3.5–5.1)
Sodium: 141 mmol/L (ref 135–145)
Total Bilirubin: 0.5 mg/dL (ref 0.3–1.2)
Total Protein: 6.9 g/dL (ref 6.5–8.1)

## 2021-01-28 LAB — URINALYSIS, ROUTINE W REFLEX MICROSCOPIC
Bacteria, UA: NONE SEEN
Bilirubin Urine: NEGATIVE
Glucose, UA: NEGATIVE mg/dL
Hgb urine dipstick: NEGATIVE
Ketones, ur: 5 mg/dL — AB
Nitrite: NEGATIVE
Protein, ur: 100 mg/dL — AB
Specific Gravity, Urine: 1.029 (ref 1.005–1.030)
WBC, UA: >50 WBC/hpf — ABNORMAL HIGH (ref 0–5)
pH: 5 (ref 5.0–8.0)

## 2021-01-28 LAB — MAGNESIUM: Magnesium: 1.9 mg/dL (ref 1.7–2.4)

## 2021-01-28 MED ORDER — CIPROFLOXACIN HCL 500 MG PO TABS: 500.0000 mg | ORAL_TABLET | Freq: Two times a day (BID) | ORAL | 0 refills | Status: DC

## 2021-01-28 NOTE — Progress Notes (Signed)
Brenda Mclean, [place] [address]   CLINIC:  Medical Oncology/Hematology  PCP:  Roderic Scarce, MD [address] [name] / [place] [address] [phone]   REASON FOR VISIT:  Follow-up for rectal cancer  PRIOR THERAPY:  1. Transanal resection of polyp on 10/2012. 2. FOLFOX and Aloxi x 7 cycles from 09/16/2020 to 12/31/2020.  NGS Results: Not done  CURRENT THERAPY: Surveillance  BRIEF ONCOLOGIC HISTORY:  Oncology History  Rectal cancer ([place])  08/14/2020 Initial Diagnosis   Rectal cancer ([place])   09/02/2020 Cancer Staging   Staging form: Colon and Rectum, AJCC 8th Edition - Clinical stage from 09/02/2020: Stage IIA (cT3, cN0, cM0) - Signed by [name], MD on 09/02/2020   09/16/2020 -  Chemotherapy   The patient had palonosetron (ALOXI) injection 0.25 mg, 0.25 mg, Intravenous,  Once, 7 of 7 cycles Administration: 0.25 mg (09/16/2020), 0.25 mg (10/07/2020), 0.25 mg (10/21/2020), 0.25 mg (11/04/2020), 0.25 mg (11/26/2020), 0.25 mg (12/10/2020), 0.25 mg (12/31/2020) pegfilgrastim-cbqv (UDENYCA) injection 6 mg, 6 mg, Subcutaneous, Once, 6 of 6 cycles Administration: 6 mg (10/09/2020), 6 mg (10/23/2020), 6 mg (11/06/2020), 6 mg (01/01/2021) leucovorin 700 mg in dextrose 5 % 250 mL infusion, 380 mg/m2 = 736 mg, Intravenous,  Once, 7 of 7 cycles Dose modification: 300 mg/m2 (original dose 400 mg/m2, Cycle 6, Reason: Provider Judgment) Administration: 700 mg (09/16/2020), 700 mg (10/07/2020), 700 mg (10/21/2020), 700 mg (11/04/2020), 700 mg (11/26/2020), 552 mg (12/10/2020), 552 mg (12/31/2020) oxaliplatin (ELOXATIN) 125 mg in dextrose 5 % 500 mL chemo infusion, 68 mg/m2 = 125 mg (80 % of original dose 85 mg/m2), Intravenous,  Once, 7 of 7 cycles Dose modification: 68 mg/m2 (80 % of original dose 85 mg/m2, Cycle 1, Reason: Provider Judgment), 68 mg/m2 (80 % of original dose 85 mg/m2, Cycle 2, Reason: Provider Judgment), 55 mg/m2 (original dose 85 mg/m2, Cycle  4, Reason: Other (see comments), Comment: neuropathy) Administration: 125 mg (09/16/2020), 125 mg (10/07/2020), 125 mg (10/21/2020), 100 mg (11/04/2020), 100 mg (11/26/2020), 100 mg (12/10/2020), 100 mg (12/31/2020) fluorouracil (ADRUCIL) chemo injection 750 mg, 400 mg/m2 = 750 mg, Intravenous,  Once, 7 of 7 cycles Dose modification: 300 mg/m2 (original dose 400 mg/m2, Cycle 6, Reason: Provider Judgment) Administration: 750 mg (09/16/2020), 750 mg (10/07/2020), 750 mg (10/21/2020), 750 mg (11/04/2020), 750 mg (11/26/2020), 550 mg (12/10/2020), 550 mg (12/31/2020) fluorouracil (ADRUCIL) 4,400 mg in sodium chloride 0.9 % 62 mL chemo infusion, 2,400 mg/m2 = 4,400 mg, Intravenous, 1 Day/Dose, 7 of 7 cycles Dose modification: 1,920 mg/m2 (80 % of original dose 2,400 mg/m2, Cycle 5, Reason: Other (see comments), Comment: diarrhea), 1,800 mg/m2 (original dose 2,400 mg/m2, Cycle 6, Reason: Provider Judgment) Administration: 4,400 mg (09/16/2020), 4,400 mg (10/07/2020), 4,400 mg (10/21/2020), 4,400 mg (11/04/2020), 3,550 mg (11/26/2020), 3,300 mg (12/10/2020), 3,300 mg (12/31/2020)  for chemotherapy treatment.      CANCER STAGING: Cancer Staging Rectal cancer [facility]) Staging form: Colon and Rectum, AJCC 8th Edition - Clinical stage from 09/02/2020: Stage IIA (cT3, cN0, cM0) - Signed by [name], MD on 09/02/2020   INTERVAL HISTORY:  Brenda Mclean, a 75 y.o. female, returns for routine follow-up of her rectal cancer. Brenda Mclean was last seen on 12/31/2020.   Today she is accompanied by her daughter and she reports feeling okay. Her energy levels have been improving steadily and shot up last week. She denies having any hematochezia. She complains of having difficulty urinating with only being able to urinate a  few drops yesterday morning. She is sleeping a bit more and her strength is improving. She continues having some confusion, losing track of time, and trouble recalling, similar to what she experienced  when she was sick with COVID in March 2020. She denies having any abdominal pain or leg swelling. Her appetite is good and she denies having N/V.  She uses a cane to ambulate and is spending most of her time in a recliner. She will talk to Dr. Epimenio Sarin, radiation oncology, on 2/17.   REVIEW OF SYSTEMS:  Review of Systems  Constitutional: Positive for appetite change (50%) and fatigue (90%).  Cardiovascular: Negative for leg swelling.  Gastrointestinal: Negative for abdominal pain, blood in stool, nausea and vomiting.  Genitourinary: Positive for difficulty urinating.   Neurological:       Mild confusion, trouble recalling, losing track of time  All other systems reviewed and are negative.   PAST MEDICAL/SURGICAL HISTORY:  Past Medical History:  Diagnosis Date  . Arthritis   . COVID-19 03/2019  . GERD (gastroesophageal reflux disease)   . HLD (hyperlipidemia)   . HTN (hypertension)   . Port-A-Cath in place 09/09/2020  . 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 Sigmoid diverticulitis 6967   Uncomplicated   Past Surgical History:  Procedure Laterality Date  . BIOPSY  07/10/2020   Procedure: BIOPSY;  Surgeon: [name], MD;  [facility];  Service: Endoscopy;;  . CHOLECYSTECTOMY  1998  . COLON SURGERY  10/30/2012   Dr. Audrie Lia; transanal resection of 2 cm rectal polyp; pathology revealed superficially invasive moderately differentiated adenocarcinoma arising in adenomatous polyp of distal rectum.  . COLONOSCOPY  10/24/2013   Dr. West Carbo; with propofol; small granuloma and sutures distal rectum, small polyp mid rectum removed, sigmoid diverticulosis, otherwise normal exam.  No pathology received.  Recommended colonoscopy in 5 years.  . COLONOSCOPY  09/08/2012   Dr. West Carbo; with propofol; multi lobulated friable 3 cm mass distal rectum s/p multiple biopsies, scattered sigmoid diverticulosis, otherwise normal exam.  Pathology with adenomatous polyp.  . COLONOSCOPY WITH  PROPOFOL N/A 07/10/2020   Procedure: COLONOSCOPY WITH PROPOFOL;  Surgeon: [name], MD;  Location: [facility];  Service: Endoscopy;  Laterality: N/A;  10:00am  . POLYPECTOMY  07/10/2020   Procedure: POLYPECTOMY;  Surgeon: [name], MD;  Location: [facility];  Service: Endoscopy;;  . PORTACATH PLACEMENT Left 09/09/2020   Procedure: INSERTION PORT-A-CATH;  Surgeon: Aviva Signs, MD;  Location: [facility] ORS;  Service: General;  Laterality: Left;  . REPLACEMENT TOTAL HIP W/  RESURFACING IMPLANTS Bilateral   . 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 REPLACEMENT TOTAL KNEE Right 12/03/2014  . TOTAL SHOULDER ARTHROPLASTY  04/26/2018    SOCIAL HISTORY:  Social History   Socioeconomic History  . Marital status: Married    Spouse name: Not on file  . Number of children: 2  . Years of education: Not on file  . Highest education level: Not on file  Occupational History    Employer: retired  Tobacco Use  . Smoking status: Never Smoker  . Smokeless tobacco: Never Used  Vaping Use  . Vaping Use: Never used  Substance and Sexual Activity  . Alcohol use: Never  . Drug use: Never  . Sexual activity: Not Currently  Other Topics Concern  . Not on file  Social History Narrative  . Not on file   Social Determinants of Health   Financial Resource Strain: Low Risk   . Difficulty of  Paying Living Expenses: Not hard at all  Food Insecurity: No Food Insecurity  . Worried About Charity fundraiser in the Last Year: Never true  . Ran Out of Food in the Last Year: Never true  Transportation Needs: No Transportation Needs  . Lack of Transportation (Medical): No  . Lack of Transportation (Non-Medical): No  Physical Activity: Inactive  . Days of Exercise per Week: 0 days  . Minutes of Exercise per Session: 0 min  Stress: No Stress Concern Present  . Feeling of Stress : Only a little  Social Connections: Moderately Integrated  . Frequency of Communication with Friends and Family:  More than three times a week  . Frequency of Social Gatherings with Friends and Family: More than three times a week  . Attends Religious Services: More than 4 times per year  . Active Member of Clubs or Organizations: No  . Attends Archivist Meetings: Never  . Marital Status: Married  Human resources officer Violence: Not At Risk  . Fear of Current or Ex-Partner: No  . Emotionally Abused: No  . Physically Abused: No  . Sexually Abused: No    FAMILY HISTORY:  Family History  Problem Relation Age of Onset  . Arthritis Mother   . High blood pressure Mother   . Heart disease Father   . Breast cancer Maternal Aunt   . Stroke Maternal Grandmother   . Cancer Paternal Grandmother   . Diabetes Brother   . Colon cancer Neg Hx     CURRENT MEDICATIONS:  Current Outpatient Medications  Medication Sig Dispense Refill  . celecoxib (CELEBREX) 200 MG capsule Take 200 mg by mouth daily.    . ciprofloxacin (CIPRO) 500 MG tablet Take 1 tablet (500 mg total) by mouth 2 (two) times daily. 14 tablet 0  . dexamethasone 10 mg in sodium chloride 0.9 % 50 mL Inject 10 mg into the vein every 14 (fourteen) days. Prior to chemotherapy administration    . feeding supplement (ENSURE ENLIVE / ENSURE PLUS) LIQD Take 237 mLs by mouth 2 (two) times daily between meals. 237 mL 12  . fluorouracil CALGB 82956 in sodium chloride 0.9 % 150 mL Inject 2,400 mg/m2 into the vein over 48 hr.    . FLUOROURACIL IV Inject 400 mg/m2 into the vein every 14 (fourteen) days.    Marland Kitchen LEUCOVORIN CALCIUM IV Inject 400 mg/m2 into the vein every 14 (fourteen) days.    Marland Kitchen lidocaine-prilocaine (EMLA) cream Apply a small amount to port a cath site and cover with plastic wrap 1 hour prior to chemotherapy appointments 30 g 3  . omega-3 acid ethyl esters (LOVAZA) 1 g capsule Take 2 g by mouth daily.    Marland Kitchen omeprazole (PRILOSEC) 20 MG capsule Take 20 mg by mouth daily.    . OXALIPLATIN IV Inject 85 mg/m2 into the vein every 14 (fourteen)  days.    . palonosetron (ALOXI) 0.25 MG/5ML injection Inject 0.25 mg into the vein every 14 (fourteen) days. Prior to chemotherapy administration    . prochlorperazine (COMPAZINE) 10 MG tablet Take 1 tablet (10 mg total) by mouth every 6 (six) hours as needed (Nausea or vomiting). 30 tablet 1  . simvastatin (ZOCOR) 40 MG tablet Take 40 mg by mouth at bedtime.     . traMADol (ULTRAM) 50 MG tablet Take 1 tablet (50 mg total) by mouth every 6 (six) hours as needed. 20 tablet 0  . Turmeric 500 MG TABS Take 500 mg by mouth daily.    Marland Kitchen  valsartan (DIOVAN) 320 MG tablet Take 320 mg by mouth daily.    . potassium chloride SA (KLOR-CON) 20 MEQ tablet Take 1 tablet (20 mEq total) by mouth 2 (two) times daily for 5 days. 10 tablet 0   No current facility-administered medications for this visit.   Facility-Administered Medications Ordered in Other Visits  Medication Dose Route Frequency Provider Last Rate Last Admin  . heparin lock flush 100 unit/mL  500 Units Intracatheter Once PRN [name], MD      . sodium chloride flush (NS) 0.9 % injection 10 mL  10 mL Intracatheter PRN [name], MD   10 mL at 11/26/20 0950    ALLERGIES:  No Known Allergies  PHYSICAL EXAM:  Performance status (ECOG): 0 - Asymptomatic  Vitals:   01/28/21 1332  BP: 139/73  Pulse: 91  Resp: 18  Temp: (!) 96.9 F (36.1 C)  SpO2: 99%   Wt Readings from Last 3 Encounters:  01/28/21 155 lb 3.2 oz (70.4 kg)  12/31/20 164 lb 4.8 oz (74.5 kg)  12/27/20 160 lb (72.6 kg)   Physical Exam Vitals reviewed.  Constitutional:      Appearance: Normal appearance.  Cardiovascular:     Rate and Rhythm: Normal rate and regular rhythm.     Pulses: Normal pulses.     Heart sounds: Normal heart sounds.  Pulmonary:     Effort: Pulmonary effort is normal.     Breath sounds: Normal breath sounds.  Musculoskeletal:     Right lower leg: No edema.     Left lower leg: No edema.  Neurological:     General: No focal  deficit present.     Mental Status: She is alert and oriented to person, place, and time.  Psychiatric:        Mood and Affect: Mood normal.        Behavior: Behavior normal.      LABORATORY DATA:  I have reviewed the labs as listed.  CBC Latest Ref Rng & Units 01/28/2021 12/31/2020 12/27/2020  WBC 4.0 - 10.5 K/uL 5.5 5.3 3.2(L)  Hemoglobin 12.0 - 15.0 g/dL 11.9(L) 11.1(L) 10.3(L)  Hematocrit 36.0 - 46.0 % 38.4 34.7(L) 32.2(L)  Platelets 150 - 400 K/uL 183 173 135(L)   CMP Latest Ref Rng & Units 01/28/2021 12/31/2020 12/27/2020  Glucose 70 - 99 mg/dL 98 91 101(H)  BUN 8 - 23 mg/dL 16 13 13   Creatinine 0.44 - 1.00 mg/dL 0.82 0.81 0.82  Sodium 135 - 145 mmol/L 141 138 138  Potassium 3.5 - 5.1 mmol/L 3.7 4.0 2.7(LL)  Chloride 98 - 111 mmol/L 107 106 103  CO2 22 - 32 mmol/L 27 22 24   Calcium 8.9 - 10.3 mg/dL 9.4 9.3 8.9  Total Protein 6.5 - 8.1 g/dL 6.9 6.9 -  Total Bilirubin 0.3 - 1.2 mg/dL 0.5 0.6 -  Alkaline Phos 38 - 126 U/L 89 98 -  AST 15 - 41 U/L 26 24 -  ALT 0 - 44 U/L 17 19 -    DIAGNOSTIC IMAGING:  I have independently reviewed the scans and discussed with the patient. No results found.   ASSESSMENT:  1. Rectal adenocarcinoma: -History of rectal polyp with adenocarcinoma, status post transanal resection of the polyp in November 2013. -Colonoscopy on July 10, 2020 shows 2.5 cm centrally depressed ulcerated lesion with heaped up circular margin, 8 cm from anal verge. Distally, just above the anal verge, suture line consistent with history of prior transanal polypectomy. -Pathology of the rectal  mass consistent with adenocarcinoma. Splenic flexure polypectomy was tubular adenoma. Sigmoid colon biopsy was benign. -CT CAP with contrast on August 05, 2020 shows no evidence of neoplastic disease within the chest, abdomen or pelvis. Complicated left midpole renal cyst versus complex cystic mass. No lung masses. -Pelvis MRI on 08/27/2020 showed T3BN0 tumor. Distance of tumor/node from  mesorectal fascia was 1 cm. Distance from tumor to the internal anal sphincter was 4.2 cm. Tumor size is 2.2 x 2.2 cm. -Total neoadjuvant therapy with 4 months of FOLFOX followed by long course chemo RT with Xeloda followed by restaging pelvis MRI 6 to 8 weeks after completion of XRT followed by resection recommended. -Cycle 1 of FOLFOX on 09/16/2020.  2. Social/family history: -She lives at home with her husband. She worked as a Counselling psychologist for 47 years. No history of smoking. -Maternal aunt had breast cancer.   PLAN:  1.Stage II (T3N0) rectal adenocarcinoma: -She has completed total of 7 cycles of FOLFOX. -She was progressively getting weaker.  She had also episodes of confusion. -She is accompanied by her daughter Brenda Mclean today. -She is having slight memory problems. -UA today showed infection.  We will send her Cipro 500 mg twice daily for 5 days. -CT chest on 12/27/2020 done in the ER showed right lung nodule likely infectious/inflammatory origin. -I have recommended repeating the CT scan. -She is meeting with Dr. Rhunette Croft on 02/12/2021. -I have discussed with her about option of 5-FU continuous infusion versus Xeloda.  We have decided to try low-dose Xeloda with radiation. -She tells me that she wants to avoid surgery if she can after completion of chemoradiation. -I will meet her again after she meets with radiation oncology. -She was encouraged to be active to improve the fatigue.  I have also offered her physical therapy which she declined.  2. Left kidney lesion: -Dr. Alyson Ingles recommended MRI to be repeated in 6 months.  3. Hypokalemia:  -Continue potassium 20 mEq daily.  Potassium is normal.  4. Diarrhea: -She rarely has diarrhea which improves with Imodium.   Orders placed this encounter:  No orders of the defined types were placed in this encounter.    [name], MD [place] [phone]   I, Milinda Antis, am acting as a  scribe for Dr. Sanda Linger.  I, [name] MD, have reviewed the above documentation for accuracy and completeness, and I agree with the above.

## 2021-01-28 NOTE — Patient Instructions (Addendum)
[place] at [facility] Discharge Instructions  You were seen today by Dr. Delton Coombes. He went over your recent results and scans. Start being more physically active during the day to strengthen your muscles and improve your memory. You will be prescribed Cipro 500 mg to take twice daily for 10 days for your UTI. You will be scheduled for a CT scan of your chest before your next visit. Dr. Delton Coombes will see you back in 3 weeks for follow up.   Thank you for choosing [place] at [facility] to provide your oncology and hematology care.  To afford each patient quality time with our provider, please arrive at least 15 minutes before your scheduled appointment time.   If you have a lab appointment with the [facility] please come in thru the Main Entrance and check in at the main information desk  You need to re-schedule your appointment should you arrive 10 or more minutes late.  We strive to give you quality time with our providers, and arriving late affects you and other patients whose appointments are after yours.  Also, if you no show three or more times for appointments you may be dismissed from the clinic at the providers discretion.     Again, thank you for choosing [facility].  Our hope is that these requests will decrease the amount of time that you wait before being seen by our physicians.       _____________________________________________________________  Should you have questions after your visit to [facility], please contact our office at [phone] between the hours of 8:00 a.m. and 4:30 p.m.  Voicemails left after 4:00 p.m. will not be returned until the following business day.  For prescription refill requests, have your pharmacy contact our office and allow 72 hours.    Cancer Center Support Programs:   > Cancer Support Group  2nd Tuesday of the month 1pm-2pm, Journey Room

## 2021-01-29 LAB — URINE CULTURE: Culture: <10000 — AB

## 2021-02-24 ENCOUNTER — Ambulatory Visit (HOSPITAL_COMMUNITY): Payer: Medicare Other

## 2021-02-24 ENCOUNTER — Telehealth (HOSPITAL_COMMUNITY): Payer: Self-pay | Admitting: Pharmacist

## 2021-02-24 ENCOUNTER — Inpatient Hospital Stay (HOSPITAL_COMMUNITY): Payer: Medicare Other | Attending: Hematology | Admitting: Hematology

## 2021-02-24 ENCOUNTER — Telehealth (HOSPITAL_COMMUNITY): Payer: Self-pay | Admitting: Pharmacy Technician

## 2021-02-24 ENCOUNTER — Other Ambulatory Visit: Payer: Self-pay

## 2021-02-24 ENCOUNTER — Ambulatory Visit (HOSPITAL_COMMUNITY)
Admission: RE | Admit: 2021-02-24 | Discharge: 2021-02-24 | Disposition: A | Discharge status: Home / Self Care | Payer: Medicare Other | Source: Ambulatory Visit | Attending: Hematology | Admitting: Hematology

## 2021-02-24 VITALS — BP 153/87 | HR 73 | Temp 96.8°F | Resp 18 | Wt 162.0 lb

## 2021-02-24 DIAGNOSIS — Z95828 Presence of other vascular implants and grafts: Secondary | ICD-10-CM

## 2021-02-24 DIAGNOSIS — Z803 Family history of malignant neoplasm of breast: Secondary | ICD-10-CM | POA: Insufficient documentation

## 2021-02-24 DIAGNOSIS — E876 Hypokalemia: Secondary | ICD-10-CM | POA: Diagnosis not present

## 2021-02-24 DIAGNOSIS — R197 Diarrhea, unspecified: Secondary | ICD-10-CM | POA: Insufficient documentation

## 2021-02-24 DIAGNOSIS — N289 Disorder of kidney and ureter, unspecified: Secondary | ICD-10-CM | POA: Insufficient documentation

## 2021-02-24 DIAGNOSIS — C20 Malignant neoplasm of rectum: Secondary | ICD-10-CM | POA: Insufficient documentation

## 2021-02-24 DIAGNOSIS — Z8616 Personal history of COVID-19: Secondary | ICD-10-CM | POA: Diagnosis not present

## 2021-02-24 DIAGNOSIS — Z809 Family history of malignant neoplasm, unspecified: Secondary | ICD-10-CM | POA: Diagnosis not present

## 2021-02-24 IMAGING — CT CT CHEST W/ CM
2 of 3 series · 15 of 36 positions shown, 18 images · IV contrast (Omnipaque or Isovue)
Comparison: Chest CT [DATE], MRI abdomen [DATE]

CLINICAL DATA: New ill-defined spiculated nodular opacity within
the posterior right upper lobe found on recent chest CT

EXAM:
CT CHEST WITH CONTRAST
TECHNIQUE: Multidetector CT imaging of the chest was performed during
intravenous contrast administration.
CONTRAST:  75mL OMNIPAQUE IOHEXOL 300 MG/ML  SOLN

[Series 2: routine chest with · axial · 0.71mm/px · z∈[+1102,+1378]mm · 12 of 164 slices shown, 15 images]
[im 13/164  mediastinal]
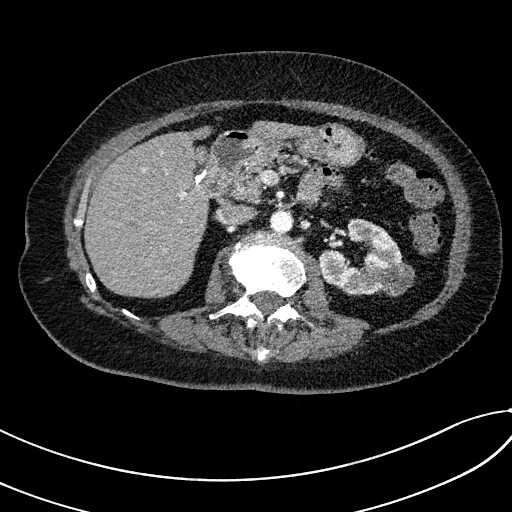
[im 13/164  lung]
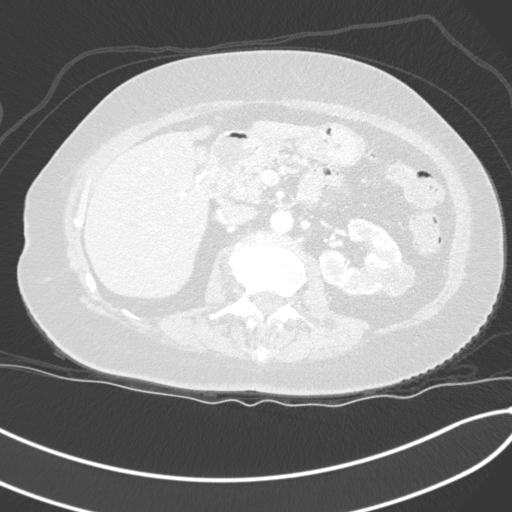
[im 25/164  lung]
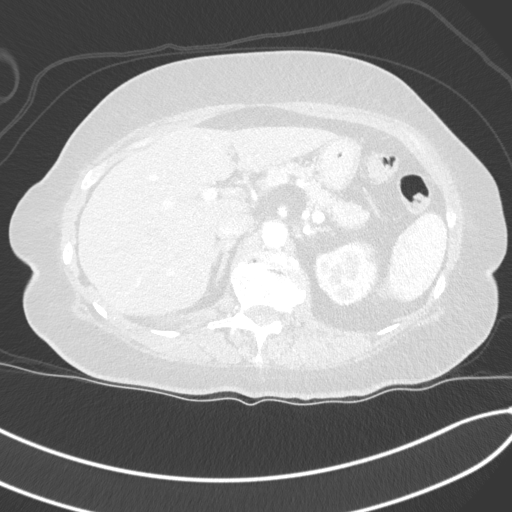
[im 37/164  lung]
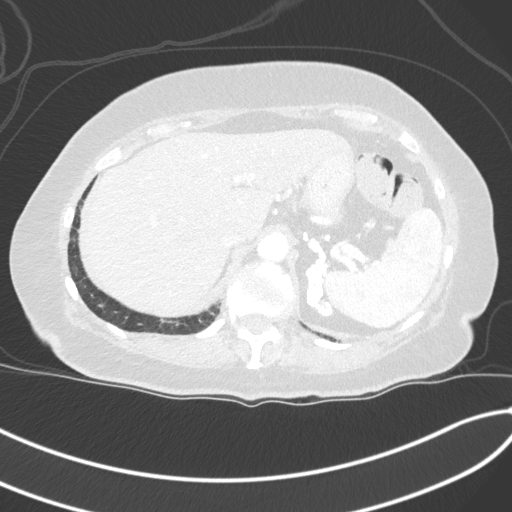
[im 49/164  lung]
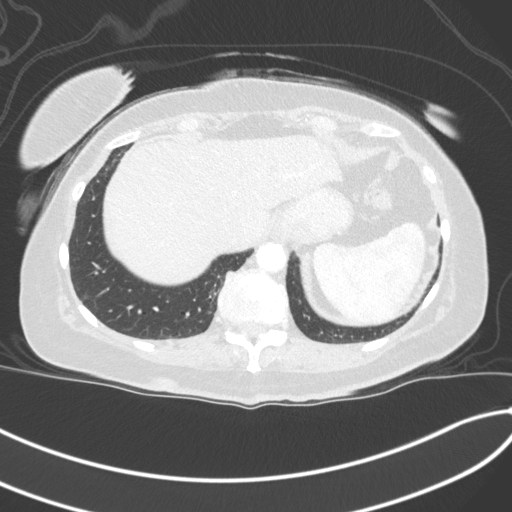
[im 61/164  mediastinal]
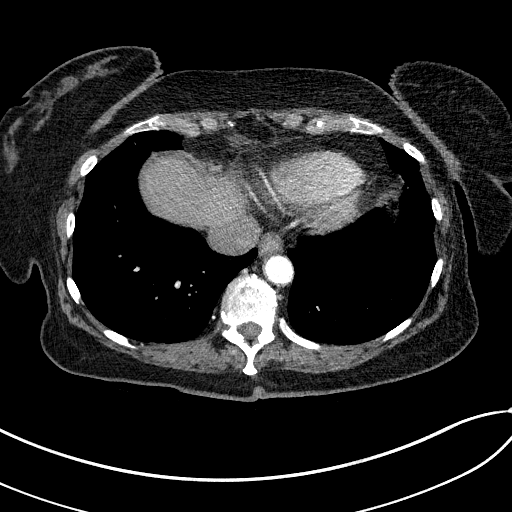
[im 61/164  lung]
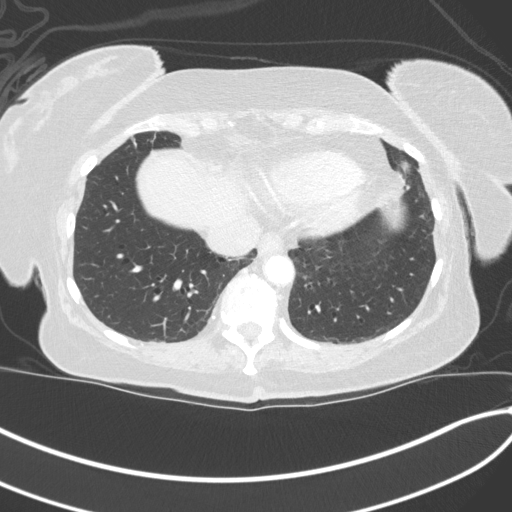
[im 73/164  lung]
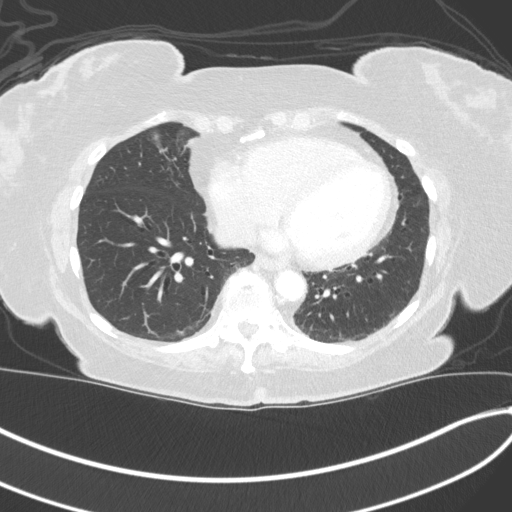
[im 91/164  lung]
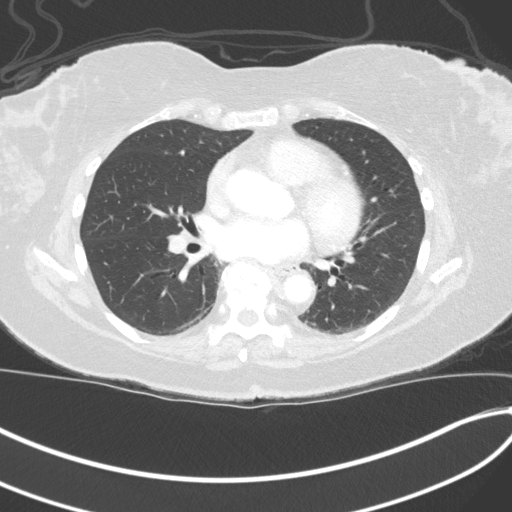
[im 103/164  lung]
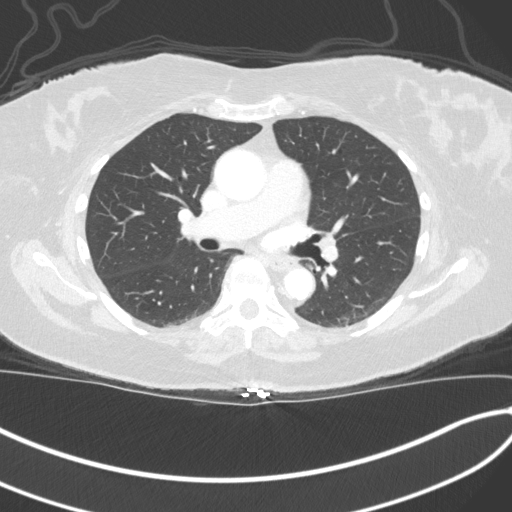
[im 115/164  mediastinal]
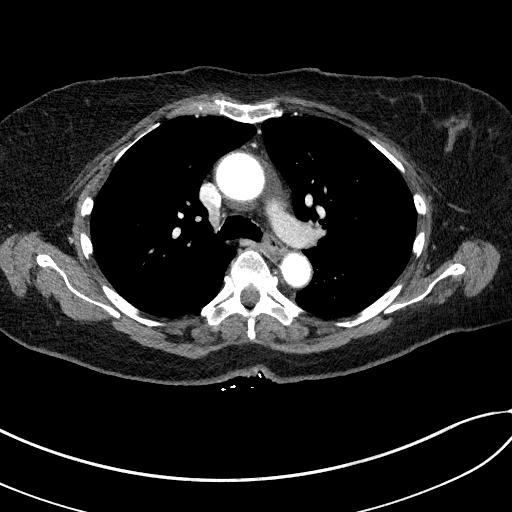
[im 115/164  lung]
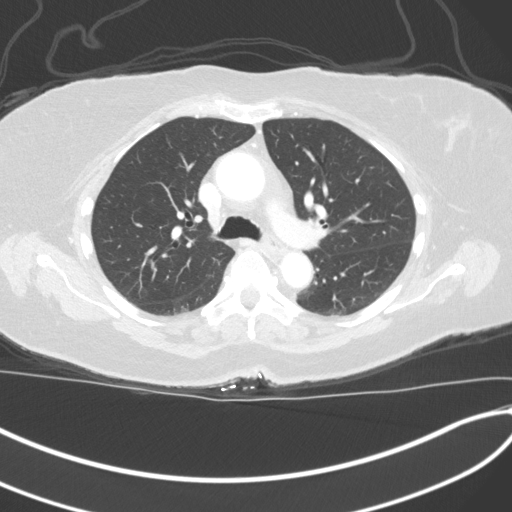
[im 127/164  lung]
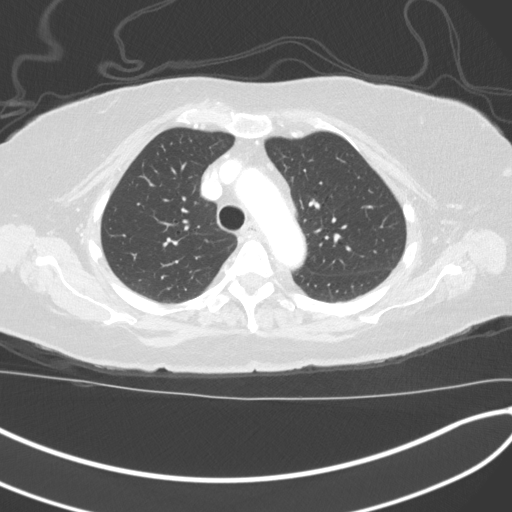
[im 139/164  lung]
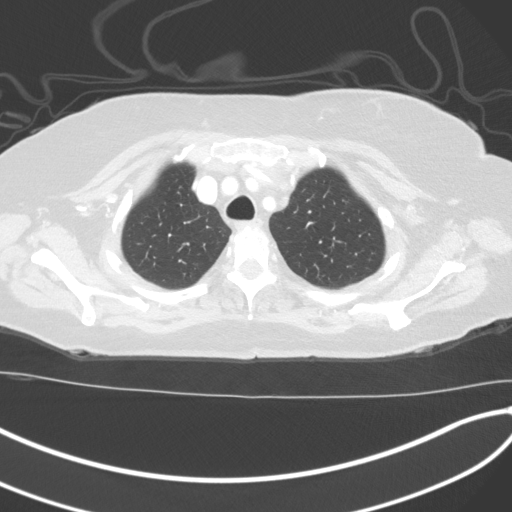
[im 151/164  lung]
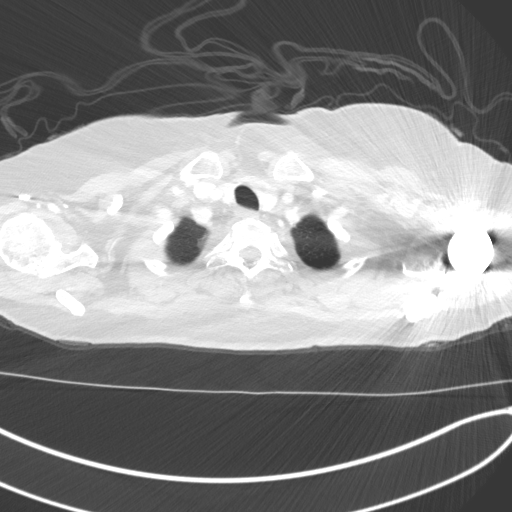

[Series 5: coronal · coronal · 0.67mm/px · 3 of 132 slices shown]
[im 27/132  lung]
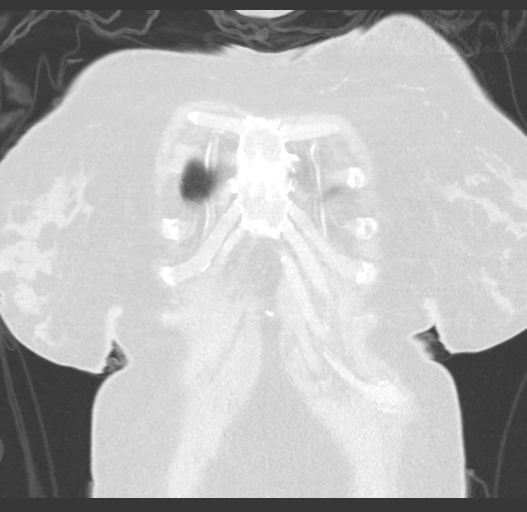
[im 53/132  lung]
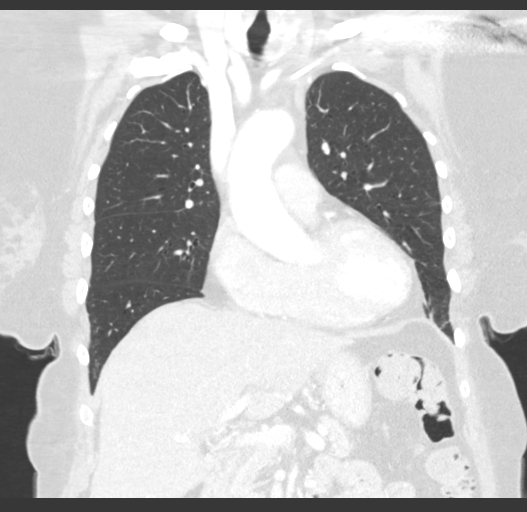
[im 79/132  lung]
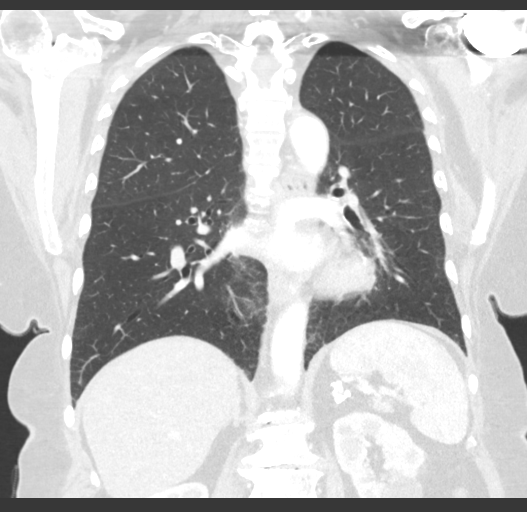

[15 of 36 positions shown; findings below may reference images not displayed]

FINDINGS: Cardiovascular: Normal size heart. No pericardial effusion. No
thoracic aortic aneurysm. No central pulmonary embolus.

Mediastinum/Nodes: No mediastinal, hilar or axillary
lymphadenopathy. Multiple left-sided hypodense thyroid nodules
measuring up to 1.4 cm. Not clinically significant; no follow-up
imaging recommended (ref: [HOSPITAL]. [DATE]): 143-50).
Small hiatal hernia.

Lungs/Pleura: Near complete resolution of the ill-defined spiculated
nodular opacity seen within the posterior right upper lobe now with
a few tiny residual nodules on image 49/4 and adjacent subsegmental
atelectasis. Bibasilar subsegmental atelectasis.

Upper Abdomen: Complex appearing 2.5 cm left interpolar renal lesion
previously classified as a Bosniak 3 cyst on MRI [DATE].

Musculoskeletal: Left shoulder arthroplasty. Degenerative change of
the right shoulder. Multilevel degenerative changes spine.
IMPRESSION: 1. Near complete resolution of the ill-defined spiculated nodular
opacity within the posterior right upper lobe now with a few tiny
residual nodules, indicative of an inflammatory/infectious process.
2. No significant change in the complex cystic 2.5 cm left
interpolar renal lesion previously classified as a Bosniak 3 cyst on
MRI [DATE].

## 2021-02-24 MED ORDER — IOHEXOL 300 MG/ML  SOLN
75.0000 mL | Freq: Once | INTRAMUSCULAR | Status: AC | PRN
  Administered 2021-02-24: 75 mL via INTRAVENOUS

## 2021-02-24 MED ORDER — CAPECITABINE 500 MG PO TABS: 625.0000 mg/m2 | ORAL_TABLET | Freq: Two times a day (BID) | ORAL | 0 refills | Status: DC

## 2021-02-24 MED ORDER — PROCHLORPERAZINE MALEATE 10 MG PO TABS: 10.0000 mg | ORAL_TABLET | Freq: Two times a day (BID) | ORAL | 1 refills | Status: DC

## 2021-02-24 NOTE — Progress Notes (Signed)
Brenda Mclean, [place] [address]   CLINIC:  Medical Oncology/Hematology  PCP:  Roderic Scarce, MD [address] [name] / [place] [address] [phone]   REASON FOR VISIT:  Follow-up for rectal cancer  PRIOR THERAPY:  1. Transanal resection of polyp on 10/2012. 2. FOLFOX and Aloxi x 7 cycles from 09/16/2020 to 12/31/2020.  NGS Results: Not done  CURRENT THERAPY: Surveillance  BRIEF ONCOLOGIC HISTORY:  Oncology History  Rectal cancer ([place])  08/14/2020 Initial Diagnosis   Rectal cancer ([place])   09/02/2020 Cancer Staging   Staging form: Colon and Rectum, AJCC 8th Edition - Clinical stage from 09/02/2020: Stage IIA (cT3, cN0, cM0) - Signed by [name], MD on 09/02/2020   09/16/2020 -  Chemotherapy   The patient had palonosetron (ALOXI) injection 0.25 mg, 0.25 mg, Intravenous,  Once, 7 of 7 cycles Administration: 0.25 mg (09/16/2020), 0.25 mg (10/07/2020), 0.25 mg (10/21/2020), 0.25 mg (11/04/2020), 0.25 mg (11/26/2020), 0.25 mg (12/10/2020), 0.25 mg (12/31/2020) pegfilgrastim-cbqv (UDENYCA) injection 6 mg, 6 mg, Subcutaneous, Once, 6 of 6 cycles Administration: 6 mg (10/09/2020), 6 mg (10/23/2020), 6 mg (11/06/2020), 6 mg (01/01/2021) leucovorin 700 mg in dextrose 5 % 250 mL infusion, 380 mg/m2 = 736 mg, Intravenous,  Once, 7 of 7 cycles Dose modification: 300 mg/m2 (original dose 400 mg/m2, Cycle 6, Reason: Provider Judgment) Administration: 700 mg (09/16/2020), 700 mg (10/07/2020), 700 mg (10/21/2020), 700 mg (11/04/2020), 700 mg (11/26/2020), 552 mg (12/10/2020), 552 mg (12/31/2020) oxaliplatin (ELOXATIN) 125 mg in dextrose 5 % 500 mL chemo infusion, 68 mg/m2 = 125 mg (80 % of original dose 85 mg/m2), Intravenous,  Once, 7 of 7 cycles Dose modification: 68 mg/m2 (80 % of original dose 85 mg/m2, Cycle 1, Reason: Provider Judgment), 68 mg/m2 (80 % of original dose 85 mg/m2, Cycle 2, Reason: Provider Judgment), 55 mg/m2 (original dose 85 mg/m2, Cycle  4, Reason: Other (see comments), Comment: neuropathy) Administration: 125 mg (09/16/2020), 125 mg (10/07/2020), 125 mg (10/21/2020), 100 mg (11/04/2020), 100 mg (11/26/2020), 100 mg (12/10/2020), 100 mg (12/31/2020) fluorouracil (ADRUCIL) chemo injection 750 mg, 400 mg/m2 = 750 mg, Intravenous,  Once, 7 of 7 cycles Dose modification: 300 mg/m2 (original dose 400 mg/m2, Cycle 6, Reason: Provider Judgment) Administration: 750 mg (09/16/2020), 750 mg (10/07/2020), 750 mg (10/21/2020), 750 mg (11/04/2020), 750 mg (11/26/2020), 550 mg (12/10/2020), 550 mg (12/31/2020) fluorouracil (ADRUCIL) 4,400 mg in sodium chloride 0.9 % 62 mL chemo infusion, 2,400 mg/m2 = 4,400 mg, Intravenous, 1 Day/Dose, 7 of 7 cycles Dose modification: 1,920 mg/m2 (80 % of original dose 2,400 mg/m2, Cycle 5, Reason: Other (see comments), Comment: diarrhea), 1,800 mg/m2 (original dose 2,400 mg/m2, Cycle 6, Reason: Provider Judgment) Administration: 4,400 mg (09/16/2020), 4,400 mg (10/07/2020), 4,400 mg (10/21/2020), 4,400 mg (11/04/2020), 3,550 mg (11/26/2020), 3,300 mg (12/10/2020), 3,300 mg (12/31/2020)  for chemotherapy treatment.      CANCER STAGING: Cancer Staging Rectal cancer [facility]) Staging form: Colon and Rectum, AJCC 8th Edition - Clinical stage from 09/02/2020: Stage IIA (cT3, cN0, cM0) - Signed by [name], MD on 09/02/2020   INTERVAL HISTORY:  Brenda Mclean, a 75 y.o. female, returns for routine follow-up of her rectal cancer. Brenda Mclean was last seen on 01/28/2021.   Today she is accompanied by her daughter and she reports feeling well. She has not started taking Xeloda yet and met her radiation oncologist last week. Her radiation will go roughly 5.5 weeks. Her appetite is excellent. She denies having any cough  or diarrhea, though her BM's are still loose which is her baseline. She continues taking potassium.  She is able to drive now.   REVIEW OF SYSTEMS:  Review of Systems  Constitutional: Positive for fatigue  (75%). Negative for appetite change.  Respiratory: Negative for cough.   Gastrointestinal: Negative for diarrhea.  All other systems reviewed and are negative.   PAST MEDICAL/SURGICAL HISTORY:  Past Medical History:  Diagnosis Date  . Arthritis   . COVID-19 03/2019  . GERD (gastroesophageal reflux disease)   . HLD (hyperlipidemia)   . HTN (hypertension)   . Port-A-Cath in place 09/09/2020  . 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 Sigmoid diverticulitis 9924   Uncomplicated   Past Surgical History:  Procedure Laterality Date  . BIOPSY  07/10/2020   Procedure: BIOPSY;  Surgeon: [name], MD;  Location: [facility];  Service: Endoscopy;;  . CHOLECYSTECTOMY  1998  . COLON SURGERY  10/30/2012   Dr. Audrie Lia; transanal resection of 2 cm rectal polyp; pathology revealed superficially invasive moderately differentiated adenocarcinoma arising in adenomatous polyp of distal rectum.  . COLONOSCOPY  10/24/2013   Dr. West Carbo; with propofol; small granuloma and sutures distal rectum, small polyp mid rectum removed, sigmoid diverticulosis, otherwise normal exam.  No pathology received.  Recommended colonoscopy in 5 years.  . COLONOSCOPY  09/08/2012   Dr. West Carbo; with propofol; multi lobulated friable 3 cm mass distal rectum s/p multiple biopsies, scattered sigmoid diverticulosis, otherwise normal exam.  Pathology with adenomatous polyp.  . COLONOSCOPY WITH PROPOFOL N/A 07/10/2020   Procedure: COLONOSCOPY WITH PROPOFOL;  Surgeon: [name], MD;  Location: [facility];  Service: Endoscopy;  Laterality: N/A;  10:00am  . POLYPECTOMY  07/10/2020   Procedure: POLYPECTOMY;  Surgeon: [name], MD;  Location: [facility];  Service: Endoscopy;;  . PORTACATH PLACEMENT Left 09/09/2020   Procedure: INSERTION PORT-A-CATH;  Surgeon: Aviva Signs, MD;  Location: [facility] ORS;  Service: General;  Laterality: Left;  . REPLACEMENT TOTAL HIP W/  RESURFACING IMPLANTS Bilateral   . REPLACEMENT  TOTAL KNEE Left 07/04/2013  . REPLACEMENT TOTAL KNEE Right 12/03/2014  . TOTAL SHOULDER ARTHROPLASTY  04/26/2018    SOCIAL HISTORY:  Social History   Socioeconomic History  . Marital status: Married    Spouse name: Not on file  . Number of children: 2  . Years of education: Not on file  . Highest education level: Not on file  Occupational History    Employer: retired  Tobacco Use  . Smoking status: Never Smoker  . Smokeless tobacco: Never Used  Vaping Use  . Vaping Use: Never used  Substance and Sexual Activity  . Alcohol use: Never  . Drug use: Never  . Sexual activity: Not Currently  Other Topics Concern  . Not on file  Social History Narrative  . Not on file   Social Determinants of Health   Financial Resource Strain: Low Risk   . Difficulty of Paying Living Expenses: Not hard at all  Food Insecurity: No Food Insecurity  . Worried About Charity fundraiser in the Last Year: Never true  . Ran Out of Food in the Last Year: Never true  Transportation Needs: No Transportation Needs  . Lack of Transportation (Medical): No  . Lack of Transportation (Non-Medical): No  Physical Activity: Inactive  . Days of Exercise per Week: 0 days  . Minutes of Exercise per Session: 0 min  Stress: No Stress Concern Present  . Feeling of Stress :  Only a little  Social Connections: Moderately Integrated  . Frequency of Communication with Friends and Family: More than three times a week  . Frequency of Social Gatherings with Friends and Family: More than three times a week  . Attends Religious Services: More than 4 times per year  . Active Member of Clubs or Organizations: No  . Attends Archivist Meetings: Never  . Marital Status: Married  Human resources officer Violence: Not At Risk  . Fear of Current or Ex-Partner: No  . Emotionally Abused: No  . Physically Abused: No  . Sexually Abused: No    FAMILY HISTORY:  Family History  Problem Relation Age of Onset  . Arthritis  Mother   . High blood pressure Mother   . Heart disease Father   . Breast cancer Maternal Aunt   . Stroke Maternal Grandmother   . Cancer Paternal Grandmother   . Diabetes Brother   . Colon cancer Neg Hx     CURRENT MEDICATIONS:  Current Outpatient Medications  Medication Sig Dispense Refill  . amoxicillin (AMOXIL) 500 MG capsule SMARTSIG:4 Capsule(s) By Mouth    . capecitabine (XELODA) 500 MG tablet Take 2 tablets (1,000 mg total) by mouth 2 (two) times daily after a meal. 120 tablet 0  . celecoxib (CELEBREX) 200 MG capsule Take 200 mg by mouth daily.    . ciprofloxacin (CIPRO) 500 MG tablet Take 1 tablet (500 mg total) by mouth 2 (two) times daily. 14 tablet 0  . dexamethasone 10 mg in sodium chloride 0.9 % 50 mL Inject 10 mg into the vein every 14 (fourteen) days. Prior to chemotherapy administration    . feeding supplement (ENSURE ENLIVE / ENSURE PLUS) LIQD Take 237 mLs by mouth 2 (two) times daily between meals. 237 mL 12  . fluorouracil CALGB 16109 in sodium chloride 0.9 % 150 mL Inject 2,400 mg/m2 into the vein over 48 hr.    . FLUOROURACIL IV Inject 400 mg/m2 into the vein every 14 (fourteen) days.    Marland Kitchen LEUCOVORIN CALCIUM IV Inject 400 mg/m2 into the vein every 14 (fourteen) days.    Marland Kitchen lidocaine-prilocaine (EMLA) cream Apply a small amount to port a cath site and cover with plastic wrap 1 hour prior to chemotherapy appointments 30 g 3  . omega-3 acid ethyl esters (LOVAZA) 1 g capsule Take 2 g by mouth daily.    Marland Kitchen omeprazole (PRILOSEC) 20 MG capsule Take 20 mg by mouth daily.    . OXALIPLATIN IV Inject 85 mg/m2 into the vein every 14 (fourteen) days.    . palonosetron (ALOXI) 0.25 MG/5ML injection Inject 0.25 mg into the vein every 14 (fourteen) days. Prior to chemotherapy administration    . simvastatin (ZOCOR) 40 MG tablet Take 40 mg by mouth at bedtime.     . traMADol (ULTRAM) 50 MG tablet Take 1 tablet (50 mg total) by mouth every 6 (six) hours as needed. 20 tablet 0  .  Turmeric 500 MG TABS Take 500 mg by mouth daily.    . valsartan (DIOVAN) 320 MG tablet Take 320 mg by mouth daily.    . potassium chloride SA (KLOR-CON) 20 MEQ tablet Take 1 tablet (20 mEq total) by mouth 2 (two) times daily for 5 days. 10 tablet 0  . prochlorperazine (COMPAZINE) 10 MG tablet Take 1 tablet (10 mg total) by mouth 2 (two) times daily. Take 30 minutes prior to Xeloda 60 tablet 1   No current facility-administered medications for this visit.  Facility-Administered Medications Ordered in Other Visits  Medication Dose Route Frequency Provider Last Rate Last Admin  . heparin lock flush 100 unit/mL  500 Units Intracatheter Once PRN [name], MD      . sodium chloride flush (NS) 0.9 % injection 10 mL  10 mL Intracatheter PRN [name], MD   10 mL at 11/26/20 0950    ALLERGIES:  No Known Allergies  PHYSICAL EXAM:  Performance status (ECOG): 0 - Asymptomatic  Vitals:   02/24/21 1001  BP: (!) 153/87  Pulse: 73  Resp: 18  Temp: (!) 96.8 F (36 C)  SpO2: 97%   Wt Readings from Last 3 Encounters:  02/24/21 162 lb (73.5 kg)  01/28/21 155 lb 3.2 oz (70.4 kg)  12/31/20 164 lb 4.8 oz (74.5 kg)   Physical Exam Vitals reviewed.  Constitutional:      Appearance: Normal appearance. She is obese.  Cardiovascular:     Rate and Rhythm: Normal rate and regular rhythm.     Pulses: Normal pulses.     Heart sounds: Normal heart sounds.  Pulmonary:     Effort: Pulmonary effort is normal.     Breath sounds: Normal breath sounds.  Musculoskeletal:     Right lower leg: No edema.     Left lower leg: No edema.  Neurological:     General: No focal deficit present.     Mental Status: She is alert and oriented to person, place, and time.  Psychiatric:        Mood and Affect: Mood normal.        Behavior: Behavior normal.      LABORATORY DATA:  I have reviewed the labs as listed.  CBC Latest Ref Rng & Units 01/28/2021 12/31/2020 12/27/2020  WBC 4.0 - 10.5 K/uL  5.5 5.3 3.2(L)  Hemoglobin 12.0 - 15.0 g/dL 11.9(L) 11.1(L) 10.3(L)  Hematocrit 36.0 - 46.0 % 38.4 34.7(L) 32.2(L)  Platelets 150 - 400 K/uL 183 173 135(L)   CMP Latest Ref Rng & Units 01/28/2021 12/31/2020 12/27/2020  Glucose 70 - 99 mg/dL 98 91 101(H)  BUN 8 - 23 mg/dL 16 13 13   Creatinine 0.44 - 1.00 mg/dL 0.82 0.81 0.82  Sodium 135 - 145 mmol/L 141 138 138  Potassium 3.5 - 5.1 mmol/L 3.7 4.0 2.7(LL)  Chloride 98 - 111 mmol/L 107 106 103  CO2 22 - 32 mmol/L 27 22 24   Calcium 8.9 - 10.3 mg/dL 9.4 9.3 8.9  Total Protein 6.5 - 8.1 g/dL 6.9 6.9 -  Total Bilirubin 0.3 - 1.2 mg/dL 0.5 0.6 -  Alkaline Phos 38 - 126 U/L 89 98 -  AST 15 - 41 U/L 26 24 -  ALT 0 - 44 U/L 17 19 -    DIAGNOSTIC IMAGING:  I have independently reviewed the scans and discussed with the patient. No results found.   ASSESSMENT:  1. Rectal adenocarcinoma: -History of rectal polyp with adenocarcinoma, status post transanal resection of the polyp in November 2013. -Colonoscopy on July 10, 2020 shows 2.5 cm centrally depressed ulcerated lesion with heaped up circular margin, 8 cm from anal verge. Distally, just above the anal verge, suture line consistent with history of prior transanal polypectomy. -Pathology of the rectal mass consistent with adenocarcinoma. Splenic flexure polypectomy was tubular adenoma. Sigmoid colon biopsy was benign. -CT CAP with contrast on August 05, 2020 shows no evidence of neoplastic disease within the chest, abdomen or pelvis. Complicated left midpole renal cyst versus complex cystic mass. No lung masses. -  Pelvis MRI on 08/27/2020 showed T3BN0 tumor. Distance of tumor/node from mesorectal fascia was 1 cm. Distance from tumor to the internal anal sphincter was 4.2 cm. Tumor size is 2.2 x 2.2 cm. -Total neoadjuvant therapy with 4 months of FOLFOX followed by long course chemo RT with Xeloda followed by restaging pelvis MRI 6 to 8 weeks after completion of XRT followed by resection  recommended. -Cycle 1 of FOLFOX on 09/16/2020.  2. Social/family history: -She lives at home with her husband. She worked as a Counselling psychologist for 47 years. No history of smoking. -Maternal aunt had breast cancer.   PLAN:  1.Stage II (T3N0) rectal adenocarcinoma: -She met with radiation oncology in [place]. -We have reviewed CT chest with contrast from 02/24/2021.  There is complete resolution of the ill-defined spiculated nodular density within the posterior right upper lobe.  No new lesions. -Overall her energy levels and appetite has improved significantly. -She will likely start radiation therapy in 2 weeks. -I have recommended Xeloda at 625 mg per metered squared dose given her difficulty with chemotherapy.  She will be rounded off to 2 tablets twice daily, 12 hours apart, Monday through Friday throughout the course of radiation. -We discussed side effects in detail including mucositis, hand-foot skin reaction, cytopenias, diarrhea among others. -We will monitor her closely once a week during treatments. -RTC 2 weeks for follow-up.   2. Left kidney lesion: -Dr. Alyson Ingles recommended MRI to be repeated in 6 months. -No significant change in the complex 2.5 cm left interpolar renal lesion on CT scan.  3. Hypokalemia:  -Continue potassium 20 mEq daily.  4. Diarrhea: -She does not have any diarrhea at this time.  However she has some loose stools.   Orders placed this encounter:  No orders of the defined types were placed in this encounter.    [name], MD [place] [phone]   I, Milinda Antis, am acting as a scribe for Dr. Sanda Linger.  I, [name] MD, have reviewed the above documentation for accuracy and completeness, and I agree with the above.

## 2021-02-24 NOTE — Patient Instructions (Addendum)
[place] at [facility] Discharge Instructions  You were seen today by Dr. Delton Coombes. He went over your recent results and scans. You will be prescribed Xeloda, which is the chemo tablet, to take 2 tablets twice daily at least 12 hours apart while undergoing radiation. Take Compazine 30 minutes prior to taking Xeloda to prevent nausea and as needed for nausea. If you develop pain with redness and blistering in your hands, please call the office immediately. Use a moisturizing lotion twice a day to keep your hands moist. Use Imodium if you develop watery bowel movements. Your port will be flushed every 3 months. Dr. Delton Coombes will see you back in 2 weeks (after your first week of radiation) for labs and follow up.   Thank you for choosing [place] at [facility] to provide your oncology and hematology care.  To afford each patient quality time with our provider, please arrive at least 15 minutes before your scheduled appointment time.   If you have a lab appointment with the [facility] please come in thru the Main Entrance and check in at the main information desk  You need to re-schedule your appointment should you arrive 10 or more minutes late.  We strive to give you quality time with our providers, and arriving late affects you and other patients whose appointments are after yours.  Also, if you no show three or more times for appointments you may be dismissed from the clinic at the providers discretion.     Again, thank you for choosing [facility].  Our hope is that these requests will decrease the amount of time that you wait before being seen by our physicians.       _____________________________________________________________  Should you have questions after your visit to [facility], please contact our office at [phone] between the hours of 8:00 a.m. and 4:30 p.m.  Voicemails left after 4:00 p.m. will not  be returned until the following business day.  For prescription refill requests, have your pharmacy contact our office and allow 72 hours.    Cancer Center Support Programs:   > Cancer Support Group  2nd Tuesday of the month 1pm-2pm, Journey Room

## 2021-02-24 NOTE — Telephone Encounter (Signed)
Oral Oncology Patient Advocate Encounter  After completing a benefits investigation, prior authorization for Xeloda is not required at this time through Medicare B.  Patient's copay is $0.00.  [place] Patient [employer] Phone [phone] Fax [phone] 02/24/2021 2:36 PM

## 2021-02-24 NOTE — Telephone Encounter (Signed)
Oral Oncology Pharmacist Encounter  Received new prescription for Xeloda (capecitabine) for the treatment of stage II rectal cancer in conjunction with XRT, planned duration until the end of XRT.  CMP from 01/28/21 assessed, no relevant lab abnormalities. Prescription dose and frequency assessed. MD starting patient on a reduced dose of capecitabine.   Current medication list in Epic reviewed, one DDIs with capecitabine identified: - Omeprazole: Proton Pump Inhibitors (PPI) may diminish the therapeutic effect of capecitabine, varying information on the clinical impact. Recommend evaluating the need for a PPI/acid suppression. If acid suppression is needed, attempt switching to a H2 antagonist (eg, famotidine) if possible.  Evaluated chart and no patient barriers to medication adherence identified.   Prescription has been e-scribed to the [facility] for benefits analysis and approval.  Oral Oncology Clinic will continue to follow for insurance authorization, copayment issues, initial counseling and start date.  Darl Pikes, PharmD, BCPS, BCOP, CPP Hematology/Oncology Clinical Pharmacist Practitioner [facility] [phone]  02/24/2021 1:59 PM

## 2021-02-25 ENCOUNTER — Other Ambulatory Visit (HOSPITAL_COMMUNITY): Payer: Self-pay | Admitting: *Deleted

## 2021-02-25 MED ORDER — POTASSIUM CHLORIDE CRYS ER 20 MEQ PO TBCR: 20.0000 meq | EXTENDED_RELEASE_TABLET | Freq: Every day | ORAL | 2 refills | Status: DC

## 2021-02-27 ENCOUNTER — Other Ambulatory Visit (HOSPITAL_COMMUNITY): Payer: Self-pay | Admitting: Hematology

## 2021-02-27 MED ORDER — CAPECITABINE 500 MG PO TABS: 625.0000 mg/m2 | ORAL_TABLET | Freq: Two times a day (BID) | ORAL | 0 refills | Status: DC

## 2021-03-04 MED FILL — XELODA 500 MG TABLET: 500 | 28 days supply | Qty: 80 | Fill #0

## 2021-03-04 NOTE — Telephone Encounter (Signed)
Oral Oncology Patient Advocate Encounter  I spoke with Sharee Pimple (daughter) this afternoon to set up delivery of Xeloda.  Address verified for shipment.  Xeloda will be filled through [facility] and mailed 03/04/21 for delivery 03/05/21.    [place] will call 7-10 days before next refill is due to complete adherence call and set up delivery of medication.     [place] Patient [place] Phone [phone] Fax [phone] 03/04/2021 2:05 PM

## 2021-03-04 NOTE — Telephone Encounter (Signed)
Oral Chemotherapy Pharmacist Encounter  Patient Education I spoke with patient's daughter Sharee Pimple for overview of new oral chemotherapy medication: Xeloda (capecitabine) for the treatment of stage II rectal cancer in conjunction with XRT, planned duration until the end of XRT.   Counseled Jill on administration, dosing, side effects, monitoring, drug-food interactions, safe handling, storage, and disposal. Patient will take Take 2 tablets (1,000 mg total) by mouth 2 (two) times daily after a meal. Take Monday- Friday. Take only on days of radiation.  Side effects include but not limited to: diarrhea, hand-foot syndrome, N/V, fatigue, decreased wbc.    Patient has had trouble with nausea previously, so she will take prochlorperazine 1 tablet (10 mg total) 30 minutes prior to Xeloda.   Reviewed with Sharee Pimple importance of keeping a medication schedule and plan for any missed doses.  After discussion with Sharee Pimple no patient barriers to medication adherence identified. Ms. Goodwill is able to manage her own medications.  Sharee Pimple  voiced understanding and appreciation. All questions answered. Medication handout provided.  Provided Sharee Pimple with Oral Chemotherapy Navigation Clinic phone number. Sharee Pimple knows to call the office with questions or concerns. Oral Chemotherapy Navigation Clinic will continue to follow.  Darl Pikes, PharmD, BCPS, BCOP, CPP Hematology/Oncology Clinical Pharmacist Practitioner [facility] [phone]  03/04/2021 2:20 PM

## 2021-03-12 ENCOUNTER — Other Ambulatory Visit (HOSPITAL_COMMUNITY): Payer: Medicare Other

## 2021-03-12 ENCOUNTER — Ambulatory Visit (HOSPITAL_COMMUNITY): Payer: Medicare Other | Admitting: Hematology

## 2021-03-20 ENCOUNTER — Other Ambulatory Visit (HOSPITAL_COMMUNITY): Payer: Self-pay

## 2021-03-26 ENCOUNTER — Inpatient Hospital Stay (HOSPITAL_BASED_OUTPATIENT_CLINIC_OR_DEPARTMENT_OTHER): Payer: Medicare Other | Admitting: Hematology

## 2021-03-26 ENCOUNTER — Other Ambulatory Visit: Payer: Self-pay

## 2021-03-26 ENCOUNTER — Inpatient Hospital Stay (HOSPITAL_COMMUNITY): Payer: Medicare Other

## 2021-03-26 VITALS — BP 114/69 | HR 72 | Temp 98.4°F | Resp 18 | Wt 161.0 lb

## 2021-03-26 DIAGNOSIS — C20 Malignant neoplasm of rectum: Secondary | ICD-10-CM

## 2021-03-26 LAB — MAGNESIUM: Magnesium: 2.1 mg/dL (ref 1.7–2.4)

## 2021-03-26 LAB — COMPREHENSIVE METABOLIC PANEL
ALT: 18 U/L (ref 0–44)
AST: 22 U/L (ref 15–41)
Albumin: 4 g/dL (ref 3.5–5.0)
Alkaline Phosphatase: 81 U/L (ref 38–126)
Anion gap: 7 (ref 5–15)
BUN: 17 mg/dL (ref 8–23)
CO2: 26 mmol/L (ref 22–32)
Calcium: 9.8 mg/dL (ref 8.9–10.3)
Chloride: 107 mmol/L (ref 98–111)
Creatinine, Ser: 0.81 mg/dL (ref 0.44–1.00)
GFR, Estimated: >60 mL/min (ref >60)
Glucose, Bld: 91 mg/dL (ref 70–99)
Potassium: 4.1 mmol/L (ref 3.5–5.1)
Sodium: 140 mmol/L (ref 135–145)
Total Bilirubin: 0.8 mg/dL (ref 0.3–1.2)
Total Protein: 7.4 g/dL (ref 6.5–8.1)

## 2021-03-26 LAB — CBC WITH DIFFERENTIAL/PLATELET
Abs Immature Granulocytes: 0.02 10*3/uL (ref 0.00–0.07)
Basophils Absolute: 0 10*3/uL (ref 0.0–0.1)
Basophils Relative: 0 %
Eosinophils Absolute: 0.1 10*3/uL (ref 0.0–0.5)
Eosinophils Relative: 2 %
HCT: 43.7 % (ref 36.0–46.0)
Hemoglobin: 13.8 g/dL (ref 12.0–15.0)
Immature Granulocytes: 0 %
Lymphocytes Relative: 16 %
Lymphs Abs: 0.9 10*3/uL (ref 0.7–4.0)
MCH: 31.2 pg (ref 26.0–34.0)
MCHC: 31.6 g/dL (ref 30.0–36.0)
MCV: 98.9 fL (ref 80.0–100.0)
Monocytes Absolute: 0.4 10*3/uL (ref 0.1–1.0)
Monocytes Relative: 7 %
Neutro Abs: 4.2 10*3/uL (ref 1.7–7.7)
Neutrophils Relative %: 75 %
Platelets: 192 10*3/uL (ref 150–400)
RBC: 4.42 MIL/uL (ref 3.87–5.11)
RDW: 13.6 % (ref 11.5–15.5)
WBC: 5.6 10*3/uL (ref 4.0–10.5)
nRBC: 0 % (ref 0.0–0.2)

## 2021-03-26 NOTE — Patient Instructions (Signed)
[place] at [facility] Discharge Instructions  You were seen today by Dr. Delton Coombes. He went over your recent results. Start taking Xeloda 2 tablets (1,000 mg) twice daily during the week; continue going to radiation every day during the week. Dr. Delton Coombes will see you back in 1 week for labs and follow up.   Thank you for choosing [place] at [facility] to provide your oncology and hematology care.  To afford each patient quality time with our provider, please arrive at least 15 minutes before your scheduled appointment time.   If you have a lab appointment with the [facility] please come in thru the Main Entrance and check in at the main information desk  You need to re-schedule your appointment should you arrive 10 or more minutes late.  We strive to give you quality time with our providers, and arriving late affects you and other patients whose appointments are after yours.  Also, if you no show three or more times for appointments you may be dismissed from the clinic at the providers discretion.     Again, thank you for choosing [facility].  Our hope is that these requests will decrease the amount of time that you wait before being seen by our physicians.       _____________________________________________________________  Should you have questions after your visit to [facility], please contact our office at [phone] between the hours of 8:00 a.m. and 4:30 p.m.  Voicemails left after 4:00 p.m. will not be returned until the following business day.  For prescription refill requests, have your pharmacy contact our office and allow 72 hours.    Cancer Center Support Programs:   > Cancer Support Group  2nd Tuesday of the month 1pm-2pm, Journey Room

## 2021-03-26 NOTE — Progress Notes (Signed)
Brenda Mclean, [place] [address]   CLINIC:  Medical Oncology/Hematology  PCP:  Roderic Scarce, MD [address] [place] / [place] [address] [phone]   REASON FOR VISIT:  Follow-up for rectal cancer  PRIOR THERAPY:  1. Transanal resection of polyp on 10/2012. 2. FOLFOX and Aloxi x 7 cycles from 09/16/2020 to 12/31/2020.  NGS Results: Not done  CURRENT THERAPY: XRT with Xeloda 1,000 mg BID  BRIEF ONCOLOGIC HISTORY:  Oncology History  Rectal cancer (Brenda Mclean)  08/14/2020 Initial Diagnosis   Rectal cancer (Brenda Mclean)   09/02/2020 Cancer Staging   Staging form: Colon and Rectum, AJCC 8th Edition - Clinical stage from 09/02/2020: Stage IIA (cT3, cN0, cM0) - Signed by [name], MD on 09/02/2020   09/16/2020 -  Chemotherapy   The patient had palonosetron (ALOXI) injection 0.25 mg, 0.25 mg, Intravenous,  Once, 7 of 7 cycles Administration: 0.25 mg (09/16/2020), 0.25 mg (10/07/2020), 0.25 mg (10/21/2020), 0.25 mg (11/04/2020), 0.25 mg (11/26/2020), 0.25 mg (12/10/2020), 0.25 mg (12/31/2020) pegfilgrastim-cbqv (UDENYCA) injection 6 mg, 6 mg, Subcutaneous, Once, 6 of 6 cycles Administration: 6 mg (10/09/2020), 6 mg (10/23/2020), 6 mg (11/06/2020), 6 mg (01/01/2021) leucovorin 700 mg in dextrose 5 % 250 mL infusion, 380 mg/m2 = 736 mg, Intravenous,  Once, 7 of 7 cycles Dose modification: 300 mg/m2 (original dose 400 mg/m2, Cycle 6, Reason: Provider Judgment) Administration: 700 mg (09/16/2020), 700 mg (10/07/2020), 700 mg (10/21/2020), 700 mg (11/04/2020), 700 mg (11/26/2020), 552 mg (12/10/2020), 552 mg (12/31/2020) oxaliplatin (ELOXATIN) 125 mg in dextrose 5 % 500 mL chemo infusion, 68 mg/m2 = 125 mg (80 % of original dose 85 mg/m2), Intravenous,  Once, 7 of 7 cycles Dose modification: 68 mg/m2 (80 % of original dose 85 mg/m2, Cycle 1, Reason: Provider Judgment), 68 mg/m2 (80 % of original dose 85 mg/m2, Cycle 2, Reason: Provider Judgment), 55 mg/m2 (original dose  85 mg/m2, Cycle 4, Reason: Other (see comments), Comment: neuropathy) Administration: 125 mg (09/16/2020), 125 mg (10/07/2020), 125 mg (10/21/2020), 100 mg (11/04/2020), 100 mg (11/26/2020), 100 mg (12/10/2020), 100 mg (12/31/2020) fluorouracil (ADRUCIL) chemo injection 750 mg, 400 mg/m2 = 750 mg, Intravenous,  Once, 7 of 7 cycles Dose modification: 300 mg/m2 (original dose 400 mg/m2, Cycle 6, Reason: Provider Judgment) Administration: 750 mg (09/16/2020), 750 mg (10/07/2020), 750 mg (10/21/2020), 750 mg (11/04/2020), 750 mg (11/26/2020), 550 mg (12/10/2020), 550 mg (12/31/2020) fluorouracil (ADRUCIL) 4,400 mg in sodium chloride 0.9 % 62 mL chemo infusion, 2,400 mg/m2 = 4,400 mg, Intravenous, 1 Day/Dose, 7 of 7 cycles Dose modification: 1,920 mg/m2 (80 % of original dose 2,400 mg/m2, Cycle 5, Reason: Other (see comments), Comment: diarrhea), 1,800 mg/m2 (original dose 2,400 mg/m2, Cycle 6, Reason: Provider Judgment) Administration: 4,400 mg (09/16/2020), 4,400 mg (10/07/2020), 4,400 mg (10/21/2020), 4,400 mg (11/04/2020), 3,550 mg (11/26/2020), 3,300 mg (12/10/2020), 3,300 mg (12/31/2020)  for chemotherapy treatment.      CANCER STAGING: Cancer Staging Rectal cancer [facility]) Staging form: Colon and Rectum, AJCC 8th Edition - Clinical stage from 09/02/2020: Stage IIA (cT3, cN0, cM0) - Signed by [name], MD on 09/02/2020   INTERVAL HISTORY:  [name], a 75 y.o. female, returns for routine follow-up of her rectal cancer. Caylyn was last seen on 02/24/2021.   Today she is accompanied by her daughter and she reports feeling She is taking Compazine 1 tablet every morning and 1 tablet of Xeloda BID during the week; she will have 28 radiation treatments and started on  03/23. Her appetite and energy levels are okay. She continues having loose stool every morning which is her usual and takes 1 tablet Imodium daily. She is applying moisturizer to her hands when they gets dry. She denies having mouth  sores.    REVIEW OF SYSTEMS:  Review of Systems  Constitutional: Positive for fatigue (50%). Negative for appetite change.  HENT:   Negative for mouth sores.   Gastrointestinal: Positive for diarrhea (daily).  Neurological: Positive for dizziness.  [place] other systems reviewed and are negative.   PAST MEDICAL/SURGICAL HISTORY:  Past Medical History:  Diagnosis Date  . Arthritis   . COVID-19 03/2019  . GERD (gastroesophageal reflux disease)   . HLD (hyperlipidemia)   . HTN (hypertension)   . Port-A-Cath in place 09/09/2020  . Rectal adenocarcinoma ([place]da [place]clean) 11/2012  . Sigmoid diverticulitis 7096   Uncomplicated   Past Surgical History:  Procedure Laterality Date  . BIOPSY  07/10/2020   Procedure: BIOPSY;  Surgeon: [name], MD;  Location: [facility];  Service: Endoscopy;;  . CHOLECYSTECTOMY  1998  . COLON SURGERY  10/30/2012   Dr. Audrie Lia; transanal resection of 2 cm rectal polyp; pathology revealed superficially invasive moderately differentiated adenocarcinoma arising in adenomatous polyp of distal rectum.  . COLONOSCOPY  10/24/2013   Dr. [place] Carbo; with propofol; small granuloma and sutures distal rectum, small polyp mid rectum removed, sigmoid diverticulosis, otherwise normal exam.  No pathology received.  Recommended colonoscopy in 5 years.  . COLONOSCOPY  09/08/2012   Dr. [place] Carbo; with propofol; multi lobulated friable 3 cm mass distal rectum s/p multiple biopsies, scattered sigmoid diverticulosis, otherwise normal exam.  Pathology with adenomatous polyp.  . COLONOSCOPY WITH PROPOFOL N/A 07/10/2020   Procedure: COLONOSCOPY WITH PROPOFOL;  Surgeon: [name], MD;  Location: [facility];  Service: Endoscopy;  Laterality: N/A;  10:00am  . POLYPECTOMY  07/10/2020   Procedure: POLYPECTOMY;  Surgeon: [name], MD;  Location: [facility];  Service: Endoscopy;;  . PORTACATH PLACEMENT Left 09/09/2020   Procedure: INSERTION PORT-A-CATH;  Surgeon: Aviva Signs, MD;   Location: [facility];  Service: General;  Laterality: Left;  . REPLACEMENT TOTAL HIP W/  RESURFACING IMPLANTS Bilateral   . 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 REPLACEMENT TOTAL KNEE Right 12/03/2014  . TOTAL SHOULDER ARTHROPLASTY  04/26/2018    SOCIAL HISTORY:  Social History   Socioeconomic History  . Marital status: Married    Spouse name: Not on file  . Number of children: 2  . Years of education: Not on file  . Highest education level: Not on file  Occupational History    Employer: retired  Tobacco Use  . Smoking status: Never Smoker  . Smokeless tobacco: Never Used  Vaping Use  . Vaping Use: Never used  Substance and Sexual Activity  . Alcohol use: Never  . Drug use: Never  . Sexual activity: Not Currently  Other Topics Concern  . Not on file  Social History Narrative  . Not on file   Social Determinants of Health   Financial Resource Strain: Low Risk   . Difficulty of Paying Living Expenses: Not hard at [place]  Food Insecurity: No Food Insecurity  . Worried About Charity fundraiser in the Last Year: Never true  . Ran Out of Food in the Last Year: Never true  Transportation Needs: No Transportation Needs  . Lack of Transportation (Medical): No  . Lack of Transportation (Non-Medical): No  Physical Activity: Inactive  .  Days of Exercise per Week: 0 days  . Minutes of Exercise per Session: 0 min  Stress: No Stress Concern Present  . Feeling of Stress : Only a little  Social Connections: Moderately Integrated  . Frequency of Communication with Friends and Family: More than three times a week  . Frequency of Social Gatherings with Friends and Family: More than three times a week  . Attends Religious Services: More than 4 times per year  . Active Member of Clubs or Organizations: No  . Attends Archivist Meetings: Never  . Marital Status: Married  Human resources officer Violence: Not At Risk  . Fear of Current or Ex-Partner: No  . Emotionally Abused: No   . Physically Abused: No  . Sexually Abused: No    FAMILY HISTORY:  Family History  Problem Relation Age of Onset  . Arthritis Mother   . High blood pressure Mother   . Heart disease Father   . Breast cancer Maternal Aunt   . Stroke Maternal Grandmother   . Cancer Paternal Grandmother   . Diabetes Brother   . Colon cancer Neg Hx     CURRENT MEDICATIONS:  Current Outpatient Medications  Medication Sig Dispense Refill  . amoxicillin (AMOXIL) 500 MG capsule SMARTSIG:4 Capsule(s) By Mouth    . capecitabine (XELODA) 500 MG tablet Take 2 tablets (1,000 mg total) by mouth 2 (two) times daily after a meal. Take Monday- Friday. Take only on days of radiation. 120 tablet 0  . celecoxib (CELEBREX) 200 MG capsule Take 200 mg by mouth daily.    . ciprofloxacin (CIPRO) 500 MG tablet Take 1 tablet (500 mg total) by mouth 2 (two) times daily. 14 tablet 0  . dexamethasone 10 mg in sodium chloride 0.9 % 50 mL Inject 10 mg into the vein every 14 (fourteen) days. Prior to chemotherapy administration    . feeding supplement (ENSURE ENLIVE / ENSURE PLUS) LIQD Take 237 mLs by mouth 2 (two) times daily between meals. 237 mL 12  . lidocaine-prilocaine (EMLA) cream Apply a small amount to port a cath site and cover with plastic wrap 1 hour prior to chemotherapy appointments 30 g 3  . omega-3 acid ethyl esters (LOVAZA) 1 g capsule Take 2 g by mouth daily.    Marland Kitchen omeprazole (PRILOSEC) 20 MG capsule Take 20 mg by mouth daily.    . potassium chloride SA (KLOR-CON) 20 MEQ tablet Take 1 tablet (20 mEq total) by mouth daily. 30 tablet 2  . prochlorperazine (COMPAZINE) 10 MG tablet Take 1 tablet (10 mg total) by mouth 2 (two) times daily. Take 30 minutes prior to Xeloda 60 tablet 1  . simvastatin (ZOCOR) 40 MG tablet Take 40 mg by mouth at bedtime.     . Turmeric 500 MG TABS Take 500 mg by mouth daily.    . valsartan (DIOVAN) 320 MG tablet Take 320 mg by mouth daily.    . traMADol (ULTRAM) 50 MG tablet Take 1 tablet  (50 mg total) by mouth every 6 (six) hours as needed. (Patient not taking: Reported on 03/26/2021) 20 tablet 0   No current facility-administered medications for this visit.   Facility-Administered Medications Ordered in Other Visits  Medication Dose Route Frequency Provider Last Rate Last Admin  . heparin lock flush 100 unit/mL  500 Units Intracatheter Once PRN [name], MD      . sodium chloride flush (NS) 0.9 % injection 10 mL  10 mL Intracatheter PRN [name], MD   10  mL at 11/26/20 0950    ALLERGIES:  No Known Allergies  PHYSICAL EXAM:  Performance status (ECOG): 0 - Asymptomatic  Vitals:   03/26/21 1118  BP: 114/69  Pulse: 72  Resp: 18  Temp: 98.4 F (36.9 C)  SpO2: 98%   Wt Readings from Last 3 Encounters:  03/26/21 161 lb (73 kg)  02/24/21 162 lb (73.5 kg)  01/28/21 155 lb 3.2 oz (70.4 kg)   Physical Exam Vitals reviewed.  Constitutional:      Appearance: Normal appearance. She is obese.  HENT:     Mouth/Throat:     Lips: No lesions.     Mouth: No oral lesions.     Dentition: No gum lesions.     Tongue: No lesions.     Palate: No mass.  Cardiovascular:     Rate and Rhythm: Normal rate and regular rhythm.     Pulses: Normal pulses.     Heart sounds: Normal heart sounds.  Pulmonary:     Effort: Pulmonary effort is normal.     Breath sounds: Normal breath sounds.  Abdominal:     Palpations: Abdomen is soft. There is no mass.     Tenderness: There is no abdominal tenderness.  Musculoskeletal:     Right lower leg: No edema.     Left lower leg: No edema.  Neurological:     General: No focal deficit present.     Mental Status: She is alert and oriented to person, place, and time.  Psychiatric:        Mood and Affect: Mood normal.        Behavior: Behavior normal.      LABORATORY DATA:  I have reviewed the labs as listed.  CBC Latest Ref Rng & Units 03/26/2021 01/28/2021 12/31/2020  WBC 4.0 - 10.5 K/uL 5.6 5.5 5.3  Hemoglobin 12.0 -  15.0 g/dL 13.8 11.9(L) 11.1(L)  Hematocrit 36.0 - 46.0 % 43.7 38.4 34.7(L)  Platelets 150 - 400 K/uL 192 183 173   CMP Latest Ref Rng & Units 03/26/2021 01/28/2021 12/31/2020  Glucose 70 - 99 mg/dL 91 98 91  BUN 8 - 23 mg/dL 17 16 13   Creatinine 0.44 - 1.00 mg/dL 0.81 0.82 0.81  Sodium 135 - 145 mmol/L 140 141 138  Potassium 3.5 - 5.1 mmol/L 4.1 3.7 4.0  Chloride 98 - 111 mmol/L 107 107 106  CO2 22 - 32 mmol/L 26 27 22   Calcium 8.9 - 10.3 mg/dL 9.8 9.4 9.3  Total Protein 6.5 - 8.1 g/dL 7.4 6.9 6.9  Total Bilirubin 0.3 - 1.2 mg/dL 0.8 0.5 0.6  Alkaline Phos 38 - 126 U/L 81 89 98  AST 15 - 41 U/L 22 26 24   ALT 0 - 44 U/L 18 17 19     DIAGNOSTIC IMAGING:  I have independently reviewed the scans and discussed with the patient. No results found.   ASSESSMENT:  1. Rectal adenocarcinoma: -History of rectal polyp with adenocarcinoma, status post transanal resection of the polyp in November 2013. -Colonoscopy on July 10, 2020 shows 2.5 cm centrally depressed ulcerated lesion with heaped up circular margin, 8 cm from anal verge. Distally, just above the anal verge, suture line consistent with history of prior transanal polypectomy. -Pathology of the rectal mass consistent with adenocarcinoma. Splenic flexure polypectomy was tubular adenoma. Sigmoid colon biopsy was benign. -CT CAP with contrast on August 05, 2020 shows no evidence of neoplastic disease within the chest, abdomen or pelvis. Complicated left midpole renal cyst versus  complex cystic mass. No lung masses. -Pelvis MRI on 08/27/2020 showed T3BN0 tumor. Distance of tumor/node from mesorectal fascia was 1 cm. Distance from tumor to the internal anal sphincter was 4.2 cm. Tumor size is 2.2 x 2.2 cm. -Total neoadjuvant therapy with 4 months of FOLFOX followed by long course chemo RT with Xeloda followed by restaging pelvis MRI 6 to 8 weeks after completion of XRT followed by resection recommended. -7 cycles of FOLFOX from 09/16/2020  through 12/31/2020, cycle 8 discontinued due to poor tolerance. -XRT with Xeloda started on 03/18/2021.  2. Social/family history: -She lives at home with her husband. She worked as a Counselling psychologist for 47 years. No history of smoking. -Maternal aunt had breast cancer.   PLAN:  1.Stage II (T3N0) rectal adenocarcinoma: -XRT started on 03/18/2021. -She is currently taking 1 tablet of Xeloda twice daily since the start of XRT.  Denied any major GI side effects. -Reviewed labs today which did not show any major abnormalities in the chemistries and CBC. -No mucositis or hand-foot skin reaction. -Recommend increasing Xeloda to 2 tablets twice daily.  To continue taking antinausea medication prior to taking Xeloda. -RTC 1 week for follow-up.  2. Left kidney lesion: -Follow-up with Dr. Alyson Ingles with MRI in 6 months.  3. Hypokalemia:  -Continue potassium 20 mEq daily.  4. Diarrhea: -She has one episode of loose stools per day.  She is taking 1 Imodium tablet daily.   Orders placed this encounter:  Orders Placed This Encounter  Procedures  . CBC with Differential/Platelet  . Comprehensive metabolic panel  . Magnesium     [name], MD [place] [phone]   I, Milinda Antis, am acting as a scribe for Dr. Sanda Linger.  I, [name] MD, have reviewed the above documentation for accuracy and completeness, and I agree with the above.

## 2021-03-28 ENCOUNTER — Other Ambulatory Visit (HOSPITAL_COMMUNITY): Payer: Self-pay

## 2021-04-02 ENCOUNTER — Inpatient Hospital Stay (HOSPITAL_COMMUNITY): Payer: Medicare Other

## 2021-04-02 ENCOUNTER — Other Ambulatory Visit: Payer: Self-pay

## 2021-04-02 ENCOUNTER — Inpatient Hospital Stay (HOSPITAL_COMMUNITY): Payer: Medicare Other | Attending: Hematology | Admitting: Hematology

## 2021-04-02 VITALS — BP 141/68 | HR 75 | Temp 97.1°F | Resp 18 | Wt 159.4 lb

## 2021-04-02 DIAGNOSIS — E876 Hypokalemia: Secondary | ICD-10-CM | POA: Insufficient documentation

## 2021-04-02 DIAGNOSIS — R197 Diarrhea, unspecified: Secondary | ICD-10-CM | POA: Diagnosis not present

## 2021-04-02 DIAGNOSIS — C20 Malignant neoplasm of rectum: Secondary | ICD-10-CM

## 2021-04-02 LAB — CBC WITH DIFFERENTIAL/PLATELET
Abs Immature Granulocytes: 0.02 10*3/uL (ref 0.00–0.07)
Basophils Absolute: 0 10*3/uL (ref 0.0–0.1)
Basophils Relative: 0 %
Eosinophils Absolute: 0.1 10*3/uL (ref 0.0–0.5)
Eosinophils Relative: 3 %
HCT: 41.1 % (ref 36.0–46.0)
Hemoglobin: 13.5 g/dL (ref 12.0–15.0)
Immature Granulocytes: 0 %
Lymphocytes Relative: 12 %
Lymphs Abs: 0.6 10*3/uL — ABNORMAL LOW (ref 0.7–4.0)
MCH: 31.9 pg (ref 26.0–34.0)
MCHC: 32.8 g/dL (ref 30.0–36.0)
MCV: 97.2 fL (ref 80.0–100.0)
Monocytes Absolute: 0.5 10*3/uL (ref 0.1–1.0)
Monocytes Relative: 9 %
Neutro Abs: 4 10*3/uL (ref 1.7–7.7)
Neutrophils Relative %: 76 %
Platelets: 157 10*3/uL (ref 150–400)
RBC: 4.23 MIL/uL (ref 3.87–5.11)
RDW: 13.9 % (ref 11.5–15.5)
WBC: 5.3 10*3/uL (ref 4.0–10.5)
nRBC: 0 % (ref 0.0–0.2)

## 2021-04-02 LAB — COMPREHENSIVE METABOLIC PANEL
ALT: 15 U/L (ref 0–44)
AST: 18 U/L (ref 15–41)
Albumin: 3.9 g/dL (ref 3.5–5.0)
Alkaline Phosphatase: 76 U/L (ref 38–126)
Anion gap: 10 (ref 5–15)
BUN: 15 mg/dL (ref 8–23)
CO2: 24 mmol/L (ref 22–32)
Calcium: 9.5 mg/dL (ref 8.9–10.3)
Chloride: 107 mmol/L (ref 98–111)
Creatinine, Ser: 0.8 mg/dL (ref 0.44–1.00)
GFR, Estimated: >60 mL/min (ref >60)
Glucose, Bld: 91 mg/dL (ref 70–99)
Potassium: 3.1 mmol/L — ABNORMAL LOW (ref 3.5–5.1)
Sodium: 141 mmol/L (ref 135–145)
Total Bilirubin: 0.8 mg/dL (ref 0.3–1.2)
Total Protein: 6.9 g/dL (ref 6.5–8.1)

## 2021-04-02 LAB — MAGNESIUM: Magnesium: 1.9 mg/dL (ref 1.7–2.4)

## 2021-04-02 NOTE — Progress Notes (Signed)
Brenda Mclean, Brenda Mclean [id number]   CLINIC:  Medical Oncology/Hematology  PCP:  Roderic Scarce, MD [address] [name] / [place] [address] [phone]   REASON FOR VISIT:  Follow-up for rectal cancer  PRIOR THERAPY:  1. Transanal resection of polyp on 10/2012. 2. FOLFOX and Aloxi x 7 cycles from 09/16/2020 to 12/31/2020  NGS Results: Not done  CURRENT THERAPY: XRT with Xeloda BID (1,000 mg & 500 mg)  BRIEF ONCOLOGIC HISTORY:  Oncology History  Rectal cancer ([place])  08/14/2020 Initial Diagnosis   Rectal cancer ([place])   09/02/2020 Cancer Staging   Staging form: Colon and Rectum, AJCC 8th Edition - Clinical stage from 09/02/2020: Stage IIA (cT3, cN0, cM0) - Signed by [name], MD on 09/02/2020   09/16/2020 -  Chemotherapy   The patient had palonosetron (ALOXI) injection 0.25 mg, 0.25 mg, Intravenous,  Once, 7 of 7 cycles Administration: 0.25 mg (09/16/2020), 0.25 mg (10/07/2020), 0.25 mg (10/21/2020), 0.25 mg (11/04/2020), 0.25 mg (11/26/2020), 0.25 mg (12/10/2020), 0.25 mg (12/31/2020) pegfilgrastim-cbqv (UDENYCA) injection 6 mg, 6 mg, Subcutaneous, Once, 6 of 6 cycles Administration: 6 mg (10/09/2020), 6 mg (10/23/2020), 6 mg (11/06/2020), 6 mg (01/01/2021) leucovorin 700 mg in dextrose 5 % 250 mL infusion, 380 mg/m2 = 736 mg, Intravenous,  Once, 7 of 7 cycles Dose modification: 300 mg/m2 (original dose 400 mg/m2, Cycle 6, Reason: Provider Judgment) Administration: 700 mg (09/16/2020), 700 mg (10/07/2020), 700 mg (10/21/2020), 700 mg (11/04/2020), 700 mg (11/26/2020), 552 mg (12/10/2020), 552 mg (12/31/2020) oxaliplatin (ELOXATIN) 125 mg in dextrose 5 % 500 mL chemo infusion, 68 mg/m2 = 125 mg (80 % of original dose 85 mg/m2), Intravenous,  Once, 7 of 7 cycles Dose modification: 68 mg/m2 (80 % of original dose 85 mg/m2, Cycle 1, Reason: Provider Judgment), 68 mg/m2 (80 % of original dose 85 mg/m2, Cycle 2, Reason: Provider Judgment), 55 mg/m2  (original dose 85 mg/m2, Cycle 4, Reason: Other (see comments), Comment: neuropathy) Administration: 125 mg (09/16/2020), 125 mg (10/07/2020), 125 mg (10/21/2020), 100 mg (11/04/2020), 100 mg (11/26/2020), 100 mg (12/10/2020), 100 mg (12/31/2020) fluorouracil (ADRUCIL) chemo injection 750 mg, 400 mg/m2 = 750 mg, Intravenous,  Once, 7 of 7 cycles Dose modification: 300 mg/m2 (original dose 400 mg/m2, Cycle 6, Reason: Provider Judgment) Administration: 750 mg (09/16/2020), 750 mg (10/07/2020), 750 mg (10/21/2020), 750 mg (11/04/2020), 750 mg (11/26/2020), 550 mg (12/10/2020), 550 mg (12/31/2020) fluorouracil (ADRUCIL) 4,400 mg in sodium chloride 0.9 % 62 mL chemo infusion, 2,400 mg/m2 = 4,400 mg, Intravenous, 1 Day/Dose, 7 of 7 cycles Dose modification: 1,920 mg/m2 (80 % of original dose 2,400 mg/m2, Cycle 5, Reason: Other (see comments), Comment: diarrhea), 1,800 mg/m2 (original dose 2,400 mg/m2, Cycle 6, Reason: Provider Judgment) Administration: 4,400 mg (09/16/2020), 4,400 mg (10/07/2020), 4,400 mg (10/21/2020), 4,400 mg (11/04/2020), 3,550 mg (11/26/2020), 3,300 mg (12/10/2020), 3,300 mg (12/31/2020)  for chemotherapy treatment.      CANCER STAGING: Cancer Staging Rectal cancer [facility]) Staging form: Colon and Rectum, AJCC 8th Edition - Clinical stage from 09/02/2020: Stage IIA (cT3, cN0, cM0) - Signed by [name], MD on 09/02/2020   INTERVAL HISTORY:  Brenda Mclean, a 75 y.o. female, returns for routine follow-up of her rectal cancer. Brenda Mclean was last seen on 03/26/2021.   Today she reports feeling okay. She took Xeloda 1,000 mg BID on Friday and she developed cramping abdominal pain and swelling on Saturday which lasted half of the day, so she went back to  taking 500 mg BID. She is having 10-12 BM with occasional watery diarrhea which she has had since starting chemo and takes up to 4 tablets of Imodium which helps slow it down. She denies having N/V or mouth soreness.  She has 3 more weeks  of radiation. She will see her dentist in May.   REVIEW OF SYSTEMS:  Review of Systems  Constitutional: Positive for fatigue (75%) and unexpected weight change. Negative for appetite change.  HENT:   Negative for mouth sores.   Gastrointestinal: Positive for abdominal distention, abdominal pain and diarrhea (10-12 BM's per week; on Imodium). Negative for nausea and vomiting.  All other systems reviewed and are negative.   PAST MEDICAL/SURGICAL HISTORY:  Past Medical History:  Diagnosis Date  . Arthritis   . COVID-19 03/2019  . GERD (gastroesophageal reflux disease)   . HLD (hyperlipidemia)   . HTN (hypertension)   . Port-A-Cath in place 09/09/2020  . 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 Sigmoid diverticulitis 3710   Uncomplicated   Past Surgical History:  Procedure Laterality Date  . BIOPSY  07/10/2020   Procedure: BIOPSY;  Surgeon: [name], MD;  [facility]: [facility];  Service: Endoscopy;;  . CHOLECYSTECTOMY  1998  . COLON SURGERY  10/30/2012   Dr. Audrie Lia; transanal resection of 2 cm rectal polyp; pathology revealed superficially invasive moderately differentiated adenocarcinoma arising in adenomatous polyp of distal rectum.  . COLONOSCOPY  10/24/2013   Dr. West Carbo; with propofol; small granuloma and sutures distal rectum, small polyp mid rectum removed, sigmoid diverticulosis, otherwise normal exam.  No pathology received.  Recommended colonoscopy in 5 years.  . COLONOSCOPY  09/08/2012   Dr. West Carbo; with propofol; multi lobulated friable 3 cm mass distal rectum s/p multiple biopsies, scattered sigmoid diverticulosis, otherwise normal exam.  Pathology with adenomatous polyp.  . COLONOSCOPY WITH PROPOFOL N/A 07/10/2020   Procedure: COLONOSCOPY WITH PROPOFOL;  Surgeon: [name], MD;  Location: [facility];  Service: Endoscopy;  Laterality: N/A;  10:00am  . POLYPECTOMY  07/10/2020   Procedure: POLYPECTOMY;  Surgeon: [name], MD;  Location: [facility];  Service: Endoscopy;;  . PORTACATH PLACEMENT Left 09/09/2020   Procedure: INSERTION PORT-A-CATH;  Surgeon: Aviva Signs, MD;  Location: [facility] ORS;  Service: General;  Laterality: Left;  . REPLACEMENT TOTAL HIP W/  RESURFACING IMPLANTS Bilateral   . 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 REPLACEMENT TOTAL KNEE Right 12/03/2014  . TOTAL SHOULDER ARTHROPLASTY  04/26/2018    SOCIAL HISTORY:  Social History   Socioeconomic History  . Marital status: Married    Spouse name: Not on file  . Number of children: 2  . Years of education: Not on file  . Highest education level: Not on file  Occupational History    Employer: retired  Tobacco Use  . Smoking status: Never Smoker  . Smokeless tobacco: Never Used  Vaping Use  . Vaping Use: Never used  Substance and Sexual Activity  . Alcohol use: Never  . Drug use: Never  . Sexual activity: Not Currently  Other Topics Concern  . Not on file  Social History Narrative  . Not on file   Social Determinants of Health   Financial Resource Strain: Low Risk   . Difficulty of Paying Living Expenses: Not hard at all  Food Insecurity: No Food Insecurity  . Worried About Charity fundraiser in the Last Year: Never true  . Ran Out of Food in the Last Year: Never  true  Transportation Needs: No Transportation Needs  . Lack of Transportation (Medical): No  . Lack of Transportation (Non-Medical): No  Physical Activity: Inactive  . Days of Exercise per Week: 0 days  . Minutes of Exercise per Session: 0 min  Stress: No Stress Concern Present  . Feeling of Stress : Only a little  Social Connections: Moderately Integrated  . Frequency of Communication with Friends and Family: More than three times a week  . Frequency of Social Gatherings with Friends and Family: More than three times a week  . Attends Religious Services: More than 4 times per year  . Active Member of Clubs or Organizations: No  . Attends Archivist Meetings: Never   . Marital Status: Married  Human resources officer Violence: Not At Risk  . Fear of Current or Ex-Partner: No  . Emotionally Abused: No  . Physically Abused: No  . Sexually Abused: No    FAMILY HISTORY:  Family History  Problem Relation Age of Onset  . Arthritis Mother   . High blood pressure Mother   . Heart disease Father   . Breast cancer Maternal Aunt   . Stroke Maternal Grandmother   . Cancer Paternal Grandmother   . Diabetes Brother   . Colon cancer Neg Hx     CURRENT MEDICATIONS:  Current Outpatient Medications  Medication Sig Dispense Refill  . amoxicillin (AMOXIL) 500 MG capsule SMARTSIG:4 Capsule(s) By Mouth    . capecitabine (XELODA) 500 MG tablet Take 2 tablets (1,000 mg total) by mouth 2 (two) times daily after a meal. Take Monday- Friday. Take only on days of radiation. 120 tablet 0  . capecitabine (XELODA) 500 MG tablet TAKE 2 TABLETS (1,000 MG TOTAL) BY MOUTH 2 (TWO) TIMES DAILY AFTER A MEAL. [place]. TAKE ONLY ON DAYS OF RADIATION. 120 tablet 0  . celecoxib (CELEBREX) 200 MG capsule Take 200 mg by mouth daily.    . ciprofloxacin (CIPRO) 500 MG tablet Take 1 tablet (500 mg total) by mouth 2 (two) times daily. 14 tablet 0  . dexamethasone 10 mg in sodium chloride 0.9 % 50 mL Inject 10 mg into the vein every 14 (fourteen) days. Prior to chemotherapy administration    . feeding supplement (ENSURE ENLIVE / ENSURE PLUS) LIQD Take 237 mLs by mouth 2 (two) times daily between meals. 237 mL 12  . lidocaine-prilocaine (EMLA) cream Apply a small amount to port a cath site and cover with plastic wrap 1 hour prior to chemotherapy appointments 30 g 3  . omega-3 acid ethyl esters (LOVAZA) 1 g capsule Take 2 g by mouth daily.    Marland Kitchen omeprazole (PRILOSEC) 20 MG capsule Take 20 mg by mouth daily.    . potassium chloride SA (KLOR-CON) 20 MEQ tablet Take 1 tablet (20 mEq total) by mouth daily. 30 tablet 2  . prochlorperazine (COMPAZINE) 10 MG tablet Take 1 tablet (10 mg total) by  mouth 2 (two) times daily. Take 30 minutes prior to Xeloda 60 tablet 1  . simvastatin (ZOCOR) 40 MG tablet Take 40 mg by mouth at bedtime.     . traMADol (ULTRAM) 50 MG tablet Take 1 tablet (50 mg total) by mouth every 6 (six) hours as needed. 20 tablet 0  . Turmeric 500 MG TABS Take 500 mg by mouth daily.    . valsartan (DIOVAN) 320 MG tablet Take 320 mg by mouth daily.     No current facility-administered medications for this visit.   Facility-Administered Medications Ordered  in Other Visits  Medication Dose Route Frequency Provider Last Rate Last Admin  . heparin lock flush 100 unit/mL  500 Units Intracatheter Once PRN [name], MD      . sodium chloride flush (NS) 0.9 % injection 10 mL  10 mL Intracatheter PRN [name], MD   10 mL at 11/26/20 0950    ALLERGIES:  No Known Allergies  PHYSICAL EXAM:  Performance status (ECOG): 0 - Asymptomatic  Vitals:   04/02/21 1333  BP: (!) 141/68  Pulse: 75  Resp: 18  Temp: (!) 97.1 F (36.2 C)  SpO2: 98%   Wt Readings from Last 3 Encounters:  04/02/21 159 lb 6.4 oz (72.3 kg)  03/26/21 161 lb (73 kg)  02/24/21 162 lb (73.5 kg)   Physical Exam Vitals reviewed.  Constitutional:      Appearance: Normal appearance. She is obese.  Cardiovascular:     Rate and Rhythm: Normal rate and regular rhythm.     Pulses: Normal pulses.     Heart sounds: Normal heart sounds.  Pulmonary:     Effort: Pulmonary effort is normal.     Breath sounds: Normal breath sounds.  Abdominal:     Palpations: Abdomen is soft. There is no mass.     Tenderness: There is no abdominal tenderness.  Neurological:     General: No focal deficit present.     Mental Status: She is alert and oriented to person, place, and time.  Psychiatric:        Mood and Affect: Mood normal.        Behavior: Behavior normal.      LABORATORY DATA:  I have reviewed the labs as listed.  CBC Latest Ref Rng & Units 04/02/2021 03/26/2021 01/28/2021  WBC 4.0 -  10.5 K/uL 5.3 5.6 5.5  Hemoglobin 12.0 - 15.0 g/dL 13.5 13.8 11.9(L)  Hematocrit 36.0 - 46.0 % 41.1 43.7 38.4  Platelets 150 - 400 K/uL 157 192 183   CMP Latest Ref Rng & Units 04/02/2021 03/26/2021 01/28/2021  Glucose 70 - 99 mg/dL 91 91 98  BUN 8 - 23 mg/dL 15 17 16   Creatinine 0.44 - 1.00 mg/dL 0.80 0.81 0.82  Sodium 135 - 145 mmol/L 141 140 141  Potassium 3.5 - 5.1 mmol/L 3.1(L) 4.1 3.7  Chloride 98 - 111 mmol/L 107 107 107  CO2 22 - 32 mmol/L 24 26 27   Calcium 8.9 - 10.3 mg/dL 9.5 9.8 9.4  Total Protein 6.5 - 8.1 g/dL 6.9 7.4 6.9  Total Bilirubin 0.3 - 1.2 mg/dL 0.8 0.8 0.5  Alkaline Phos 38 - 126 U/L 76 81 89  AST 15 - 41 U/L 18 22 26   ALT 0 - 44 U/L 15 18 17     DIAGNOSTIC IMAGING:  I have independently reviewed the scans and discussed with the patient. No results found.   ASSESSMENT:  1. Rectal adenocarcinoma: -History of rectal polyp with adenocarcinoma, status post transanal resection of the polyp in November 2013. -Colonoscopy on July 10, 2020 shows 2.5 cm centrally depressed ulcerated lesion with heaped up circular margin, 8 cm from anal verge. Distally, just above the anal verge, suture line consistent with history of prior transanal polypectomy. -Pathology of the rectal mass consistent with adenocarcinoma. Splenic flexure polypectomy was tubular adenoma. Sigmoid colon biopsy was benign. -CT CAP with contrast on August 05, 2020 shows no evidence of neoplastic disease within the chest, abdomen or pelvis. Complicated left midpole renal cyst versus complex cystic mass. No lung masses. -Pelvis  MRI on 08/27/2020 showed T3BN0 tumor. Distance of tumor/node from mesorectal fascia was 1 cm. Distance from tumor to the internal anal sphincter was 4.2 cm. Tumor size is 2.2 x 2.2 cm. -Total neoadjuvant therapy with 4 months of FOLFOX followed by long course chemo RT with Xeloda followed by restaging pelvis MRI 6 to 8 weeks after completion of XRT followed by resection  recommended. -7 cycles of FOLFOX from 09/16/2020 through 12/31/2020, cycle 8 discontinued due to poor tolerance. -XRT with Xeloda started on 03/18/2021.  2. Social/family history: -She lives at home with her husband. She worked as a Counselling psychologist for 47 years. No history of smoking. -Maternal aunt had breast cancer.   PLAN:  1.Stage II (T3N0) rectal adenocarcinoma: -XRT started on 03/18/2021. -She increased his Xeloda to 2 tablets twice daily on last Friday.  She had severe cramps the whole day of Saturday. -This week she started taking Xeloda 1 tablet twice daily. -Recommend increasing Xeloda dose to 1000 mg in the morning and 500 mg in the evening.  If she develops cramping again, she will cut back to 1 tablet twice daily. -She does not have any mucositis or HFSR.  Reviewed labs which showed normal LFTs, and CBC. -RTC 1 week for follow-up.  2. Left kidney lesion: -Follow-up with Dr. Alyson Ingles with MRI in 6 months.  3. Hypokalemia:  -Continue potassium 20 mEq daily.  Today potassium is 3.1.  She will take an extra dose today.  4. Diarrhea: -She reports that she is having up to 10 loose stools per day but small quantity. -Recommend taking Imodium 2 tablets 3 times a day.   Orders placed this encounter:  No orders of the defined types were placed in this encounter.    [name], MD [place] [phone]   I, Milinda Antis, am acting as a scribe for Dr. Sanda Linger.  I, [name] MD, have reviewed the above documentation for accuracy and completeness, and I agree with the above.

## 2021-04-02 NOTE — Patient Instructions (Signed)
[place] at [facility] Discharge Instructions  You were seen today by Dr. Delton Coombes. He went over your recent results. Take another tablet of potassium today, then continue taking potassium daily. Start taking 2 tablets of Xeloda in the morning and 1 tablet in the evening. Take 2 tablets of Imodium in the morning, 2 tablets at noon and 2 tablets in the evening; do not take if you have soft stools. Dr. Delton Coombes will see you back in 1 week for labs and follow up.   Thank you for choosing [place] at [facility] to provide your oncology and hematology care.  To afford each patient quality time with our provider, please arrive at least 15 minutes before your scheduled appointment time.   If you have a lab appointment with the [facility] please come in thru the Main Entrance and check in at the main information desk  You need to re-schedule your appointment should you arrive 10 or more minutes late.  We strive to give you quality time with our providers, and arriving late affects you and other patients whose appointments are after yours.  Also, if you no show three or more times for appointments you may be dismissed from the clinic at the providers discretion.     Again, thank you for choosing [facility].  Our hope is that these requests will decrease the amount of time that you wait before being seen by our physicians.       _____________________________________________________________  Should you have questions after your visit to [facility], please contact our office at [phone] between the hours of 8:00 a.m. and 4:30 p.m.  Voicemails left after 4:00 p.m. will not be returned until the following business day.  For prescription refill requests, have your pharmacy contact our office and allow 72 hours.    Cancer Center Support Programs:   > Cancer Support Group  2nd Tuesday of the month 1pm-2pm, Journey Room

## 2021-04-08 ENCOUNTER — Inpatient Hospital Stay (HOSPITAL_BASED_OUTPATIENT_CLINIC_OR_DEPARTMENT_OTHER): Payer: Medicare Other | Admitting: Hematology

## 2021-04-08 ENCOUNTER — Other Ambulatory Visit: Payer: Self-pay

## 2021-04-08 ENCOUNTER — Inpatient Hospital Stay (HOSPITAL_COMMUNITY): Payer: Medicare Other

## 2021-04-08 VITALS — BP 124/77 | HR 80 | Temp 97.2°F | Resp 18 | Wt 157.9 lb

## 2021-04-08 DIAGNOSIS — C20 Malignant neoplasm of rectum: Secondary | ICD-10-CM

## 2021-04-08 DIAGNOSIS — Z95828 Presence of other vascular implants and grafts: Secondary | ICD-10-CM

## 2021-04-08 LAB — CBC WITH DIFFERENTIAL/PLATELET
Abs Immature Granulocytes: 0.02 10*3/uL (ref 0.00–0.07)
Basophils Absolute: 0 10*3/uL (ref 0.0–0.1)
Basophils Relative: 1 %
Eosinophils Absolute: 0.2 10*3/uL (ref 0.0–0.5)
Eosinophils Relative: 5 %
HCT: 40.9 % (ref 36.0–46.0)
Hemoglobin: 13.1 g/dL (ref 12.0–15.0)
Immature Granulocytes: 0 %
Lymphocytes Relative: 12 %
Lymphs Abs: 0.6 10*3/uL — ABNORMAL LOW (ref 0.7–4.0)
MCH: 31.3 pg (ref 26.0–34.0)
MCHC: 32 g/dL (ref 30.0–36.0)
MCV: 97.6 fL (ref 80.0–100.0)
Monocytes Absolute: 0.5 10*3/uL (ref 0.1–1.0)
Monocytes Relative: 10 %
Neutro Abs: 3.5 10*3/uL (ref 1.7–7.7)
Neutrophils Relative %: 72 %
Platelets: 165 10*3/uL (ref 150–400)
RBC: 4.19 MIL/uL (ref 3.87–5.11)
RDW: 14.6 % (ref 11.5–15.5)
WBC: 4.8 10*3/uL (ref 4.0–10.5)
nRBC: 0 % (ref 0.0–0.2)

## 2021-04-08 LAB — MAGNESIUM: Magnesium: 1.8 mg/dL (ref 1.7–2.4)

## 2021-04-08 LAB — COMPREHENSIVE METABOLIC PANEL
ALT: 15 U/L (ref 0–44)
AST: 21 U/L (ref 15–41)
Albumin: 3.9 g/dL (ref 3.5–5.0)
Alkaline Phosphatase: 75 U/L (ref 38–126)
Anion gap: 10 (ref 5–15)
BUN: 15 mg/dL (ref 8–23)
CO2: 24 mmol/L (ref 22–32)
Calcium: 9.2 mg/dL (ref 8.9–10.3)
Chloride: 107 mmol/L (ref 98–111)
Creatinine, Ser: 0.87 mg/dL (ref 0.44–1.00)
GFR, Estimated: >60 mL/min (ref >60)
Glucose, Bld: 86 mg/dL (ref 70–99)
Potassium: 3.3 mmol/L — ABNORMAL LOW (ref 3.5–5.1)
Sodium: 141 mmol/L (ref 135–145)
Total Bilirubin: 0.8 mg/dL (ref 0.3–1.2)
Total Protein: 6.9 g/dL (ref 6.5–8.1)

## 2021-04-08 NOTE — Progress Notes (Signed)
[place], [place] [address]   CLINIC:  Medical Oncology/Hematology  PCP:  Roderic Scarce, MD [address] [name] / [place] [address] [phone]   REASON FOR VISIT:  Follow-up for rectal cancer  PRIOR THERAPY:  1. Transanal resection of polyp on 10/2012. 2. FOLFOX and Aloxi x 7 cycles from 09/16/2020 to 12/31/2020  NGS Results: Not done  CURRENT THERAPY: XRT with Xeloda BID (1,000 mg & 500 mg)  BRIEF ONCOLOGIC HISTORY:  Oncology History  Rectal cancer ([place])  08/14/2020 Initial Diagnosis   Rectal cancer ([place])   09/02/2020 Cancer Staging   Staging form: Colon and Rectum, AJCC 8th Edition - Clinical stage from 09/02/2020: Stage IIA (cT3, cN0, cM0) - Signed by [name], MD on 09/02/2020   09/16/2020 -  Chemotherapy   The patient had palonosetron (ALOXI) injection 0.25 mg, 0.25 mg, Intravenous,  Once, 7 of 7 cycles Administration: 0.25 mg (09/16/2020), 0.25 mg (10/07/2020), 0.25 mg (10/21/2020), 0.25 mg (11/04/2020), 0.25 mg (11/26/2020), 0.25 mg (12/10/2020), 0.25 mg (12/31/2020) pegfilgrastim-cbqv (UDENYCA) injection 6 mg, 6 mg, Subcutaneous, Once, 6 of 6 cycles Administration: 6 mg (10/09/2020), 6 mg (10/23/2020), 6 mg (11/06/2020), 6 mg (01/01/2021) leucovorin 700 mg in dextrose 5 % 250 mL infusion, 380 mg/m2 = 736 mg, Intravenous,  Once, 7 of 7 cycles Dose modification: 300 mg/m2 (original dose 400 mg/m2, Cycle 6, Reason: Provider Judgment) Administration: 700 mg (09/16/2020), 700 mg (10/07/2020), 700 mg (10/21/2020), 700 mg (11/04/2020), 700 mg (11/26/2020), 552 mg (12/10/2020), 552 mg (12/31/2020) oxaliplatin (ELOXATIN) 125 mg in dextrose 5 % 500 mL chemo infusion, 68 mg/m2 = 125 mg (80 % of original dose 85 mg/m2), Intravenous,  Once, 7 of 7 cycles Dose modification: 68 mg/m2 (80 % of original dose 85 mg/m2, Cycle 1, Reason: Provider Judgment), 68 mg/m2 (80 % of original dose 85 mg/m2, Cycle 2, Reason: Provider Judgment), 55 mg/m2  (original dose 85 mg/m2, Cycle 4, Reason: Other (see comments), Comment: neuropathy) Administration: 125 mg (09/16/2020), 125 mg (10/07/2020), 125 mg (10/21/2020), 100 mg (11/04/2020), 100 mg (11/26/2020), 100 mg (12/10/2020), 100 mg (12/31/2020) fluorouracil (ADRUCIL) chemo injection 750 mg, 400 mg/m2 = 750 mg, Intravenous,  Once, 7 of 7 cycles Dose modification: 300 mg/m2 (original dose 400 mg/m2, Cycle 6, Reason: Provider Judgment) Administration: 750 mg (09/16/2020), 750 mg (10/07/2020), 750 mg (10/21/2020), 750 mg (11/04/2020), 750 mg (11/26/2020), 550 mg (12/10/2020), 550 mg (12/31/2020) fluorouracil (ADRUCIL) 4,400 mg in sodium chloride 0.9 % 62 mL chemo infusion, 2,400 mg/m2 = 4,400 mg, Intravenous, 1 Day/Dose, 7 of 7 cycles Dose modification: 1,920 mg/m2 (80 % of original dose 2,400 mg/m2, Cycle 5, Reason: Other (see comments), Comment: diarrhea), 1,800 mg/m2 (original dose 2,400 mg/m2, Cycle 6, Reason: Provider Judgment) Administration: 4,400 mg (09/16/2020), 4,400 mg (10/07/2020), 4,400 mg (10/21/2020), 4,400 mg (11/04/2020), 3,550 mg (11/26/2020), 3,300 mg (12/10/2020), 3,300 mg (12/31/2020)  for chemotherapy treatment.      CANCER STAGING: Cancer Staging Rectal cancer [facility]) Staging form: Colon and Rectum, AJCC 8th Edition - Clinical stage from 09/02/2020: Stage IIA (cT3, cN0, cM0) - Signed by [name], MD on 09/02/2020   INTERVAL HISTORY:  [name], a 75 y.o. female, returns for routine follow-up of her rectal cancer. Kennesha was last seen on 04/02/2021.   Today she reports feeling good. She is taking Xeloda 1.5 mg BID and tolerating it well, though she reports still getting diarrhea; she is taking Imodium TID to QID. She denies having any more  cramping, leg swelling, mouth sores, skin sores, joint pain. Her appetite is good and she denies having N/V.  She will finish radiation on 04/29.   REVIEW OF SYSTEMS:  Review of Systems  Constitutional: Positive for appetite  change (75%) and fatigue (75%).  Gastrointestinal: Positive for diarrhea.  All other systems reviewed and are negative.   PAST MEDICAL/SURGICAL HISTORY:  Past Medical History:  Diagnosis Date  . Arthritis   . COVID-19 03/2019  . GERD (gastroesophageal reflux disease)   . HLD (hyperlipidemia)   . HTN (hypertension)   . Port-A-Cath in place 09/09/2020  . 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 Sigmoid diverticulitis 3825   Uncomplicated   Past Surgical History:  Procedure Laterality Date  . BIOPSY  07/10/2020   Procedure: BIOPSY;  Surgeon: [name], MD;  Location: [facility];  Service: Endoscopy;;  . CHOLECYSTECTOMY  1998  . COLON SURGERY  10/30/2012   Dr. Audrie Lia; transanal resection of 2 cm rectal polyp; pathology revealed superficially invasive moderately differentiated adenocarcinoma arising in adenomatous polyp of distal rectum.  . COLONOSCOPY  10/24/2013   Dr. West Carbo; with propofol; small granuloma and sutures distal rectum, small polyp mid rectum removed, sigmoid diverticulosis, otherwise normal exam.  No pathology received.  Recommended colonoscopy in 5 years.  . COLONOSCOPY  09/08/2012   Dr. West Carbo; with propofol; multi lobulated friable 3 cm mass distal rectum s/p multiple biopsies, scattered sigmoid diverticulosis, otherwise normal exam.  Pathology with adenomatous polyp.  . COLONOSCOPY WITH PROPOFOL N/A 07/10/2020   Procedure: COLONOSCOPY WITH PROPOFOL;  Surgeon: [name], MD;  Location: [facility];  Service: Endoscopy;  Laterality: N/A;  10:00am  . POLYPECTOMY  07/10/2020   Procedure: POLYPECTOMY;  Surgeon: [name], MD;  Location: [facility];  Service: Endoscopy;;  . PORTACATH PLACEMENT Left 09/09/2020   Procedure: INSERTION PORT-A-CATH;  Surgeon: Aviva Signs, MD;  Location: [facility] OR[facility];  Service: General;  Laterality: Left;  . REPLACEMENT TOTAL HIP W/  RESURFACING IMPLANTS Bilateral   . 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 REPLACEMENT  TOTAL KNEE Right 12/03/2014  . TOTAL SHOULDER ARTHROPLASTY  04/26/2018    SOCIAL HISTORY:  Social History   Socioeconomic History  . Marital status: Married    Spouse name: Not on file  . Number of children: 2  . Years of education: Not on file  . Highest education level: Not on file  Occupational History    Employer: retired  Tobacco Use  . Smoking status: Never Smoker  . Smokeless tobacco: Never Used  Vaping Use  . Vaping Use: Never used  Substance and Sexual Activity  . Alcohol use: Never  . Drug use: Never  . Sexual activity: Not Currently  Other Topics Concern  . Not on file  Social History Narrative  . Not on file   Social Determinants of Health   Financial Resource Strain: Low Risk   . Difficulty of Paying Living Expenses: Not hard at all  Food Insecurity: No Food Insecurity  . Worried About Charity fundraiser in the Last Year: Never true  . Ran Out of Food in the Last Year: Never true  Transportation Needs: No Transportation Needs  . Lack of Transportation (Medical): No  . Lack of Transportation (Non-Medical): No  Physical Activity: Inactive  . Days of Exercise per Week: 0 days  . Minutes of Exercise per Session: 0 min  Stress: No Stress Concern Present  . Feeling of Stress : Only a little  Social Connections: Moderately Integrated  . Frequency of Communication with Friends and Family: More than three times a week  . Frequency of Social Gatherings with Friends and Family: More than three times a week  . Attends Religious Services: More than 4 times per year  . Active Member of Clubs or Organizations: No  . Attends Archivist Meetings: Never  . Marital Status: Married  Human resources officer Violence: Not At Risk  . Fear of Current or Ex-Partner: No  . Emotionally Abused: No  . Physically Abused: No  . Sexually Abused: No    FAMILY HISTORY:  Family History  Problem Relation Age of Onset  . Arthritis Mother   . High blood pressure Mother   .  Heart disease Father   . Breast cancer Maternal Aunt   . Stroke Maternal Grandmother   . Cancer Paternal Grandmother   . Diabetes Brother   . Colon cancer Neg Hx     CURRENT MEDICATIONS:  Current Outpatient Medications  Medication Sig Dispense Refill  . amoxicillin (AMOXIL) 500 MG capsule SMARTSIG:4 Capsule(s) By Mouth    . capecitabine (XELODA) 500 MG tablet Take 2 tablets (1,000 mg total) by mouth 2 (two) times daily after a meal. Take Monday- Friday. Take only on days of radiation. 120 tablet 0  . capecitabine (XELODA) 500 MG tablet TAKE 2 TABLETS (1,000 MG TOTAL) BY MOUTH 2 (TWO) TIMES DAILY AFTER A MEAL. [place]. TAKE ONLY ON DAYS OF RADIATION. 120 tablet 0  . celecoxib (CELEBREX) 200 MG capsule Take 200 mg by mouth daily.    . ciprofloxacin (CIPRO) 500 MG tablet Take 1 tablet (500 mg total) by mouth 2 (two) times daily. 14 tablet 0  . dexamethasone 10 mg in sodium chloride 0.9 % 50 mL Inject 10 mg into the vein every 14 (fourteen) days. Prior to chemotherapy administration    . feeding supplement (ENSURE ENLIVE / ENSURE PLUS) LIQD Take 237 mLs by mouth 2 (two) times daily between meals. 237 mL 12  . lidocaine-prilocaine (EMLA) cream Apply a small amount to port a cath site and cover with plastic wrap 1 hour prior to chemotherapy appointments 30 g 3  . omega-3 acid ethyl esters (LOVAZA) 1 g capsule Take 2 g by mouth daily.    Marland Kitchen omeprazole (PRILOSEC) 20 MG capsule Take 20 mg by mouth daily.    . potassium chloride SA (KLOR-CON) 20 MEQ tablet Take 1 tablet (20 mEq total) by mouth daily. 30 tablet 2  . prochlorperazine (COMPAZINE) 10 MG tablet Take 1 tablet (10 mg total) by mouth 2 (two) times daily. Take 30 minutes prior to Xeloda 60 tablet 1  . simvastatin (ZOCOR) 40 MG tablet Take 40 mg by mouth at bedtime.     . traMADol (ULTRAM) 50 MG tablet Take 1 tablet (50 mg total) by mouth every 6 (six) hours as needed. 20 tablet 0  . Turmeric 500 MG TABS Take 500 mg by mouth daily.     . valsartan (DIOVAN) 320 MG tablet Take 320 mg by mouth daily.     No current facility-administered medications for this visit.   Facility-Administered Medications Ordered in Other Visits  Medication Dose Route Frequency Provider Last Rate Last Admin  . heparin lock flush 100 unit/mL  500 Units Intracatheter Once PRN [name], MD      . sodium chloride flush (NS) 0.9 % injection 10 mL  10 mL Intracatheter PRN [name], MD   10 mL at 11/26/20 [phone]  ALLERGIES:  No Known Allergies  PHYSICAL EXAM:  Performance status (ECOG): 0 - Asymptomatic  Vitals:   04/08/21 1055  BP: 124/77  Pulse: 80  Resp: 18  Temp: (!) 97.2 F (36.2 C)  SpO2: 96%   Wt Readings from Last 3 Encounters:  04/08/21 157 lb 14.4 oz (71.6 kg)  04/02/21 159 lb 6.4 oz (72.3 kg)  03/26/21 161 lb (73 kg)   Physical Exam Vitals reviewed.  Constitutional:      Appearance: Normal appearance.  Cardiovascular:     Rate and Rhythm: Normal rate and regular rhythm.     Pulses: Normal pulses.     Heart sounds: Normal heart sounds.  Pulmonary:     Effort: Pulmonary effort is normal.     Breath sounds: Normal breath sounds.  Abdominal:     Palpations: Abdomen is soft. There is no hepatomegaly or mass.     Tenderness: There is no abdominal tenderness.     Hernia: No hernia is present.  Musculoskeletal:     Right lower leg: No edema.     Left lower leg: No edema.  Neurological:     General: No focal deficit present.     Mental Status: She is alert and oriented to person, place, and time.  Psychiatric:        Mood and Affect: Mood normal.        Behavior: Behavior normal.      LABORATORY DATA:  I have reviewed the labs as listed.  CBC Latest Ref Rng & Units 04/08/2021 04/02/2021 03/26/2021  WBC 4.0 - 10.5 K/uL 4.8 5.3 5.6  Hemoglobin 12.0 - 15.0 g/dL 13.1 13.5 13.8  Hematocrit 36.0 - 46.0 % 40.9 41.1 43.7  Platelets 150 - 400 K/uL 165 157 192   CMP Latest Ref Rng & Units 04/08/2021  04/02/2021 03/26/2021  Glucose 70 - 99 mg/dL 86 91 91  BUN 8 - 23 mg/dL 15 15 17   Creatinine 0.44 - 1.00 mg/dL 0.87 0.80 0.81  Sodium 135 - 145 mmol/L 141 141 140  Potassium 3.5 - 5.1 mmol/L 3.3(L) 3.1(L) 4.1  Chloride 98 - 111 mmol/L 107 107 107  CO2 22 - 32 mmol/L 24 24 26   Calcium 8.9 - 10.3 mg/dL 9.2 9.5 9.8  Total Protein 6.5 - 8.1 g/dL 6.9 6.9 7.4  Total Bilirubin 0.3 - 1.2 mg/dL 0.8 0.8 0.8  Alkaline Phos 38 - 126 U/L 75 76 81  AST 15 - 41 U/L 21 18 22   ALT 0 - 44 U/L 15 15 18     DIAGNOSTIC IMAGING:  I have independently reviewed the scans and discussed with the patient. No results found.   ASSESSMENT:  1. Rectal adenocarcinoma: -History of rectal polyp with adenocarcinoma, status post transanal resection of the polyp in November 2013. -Colonoscopy on July 10, 2020 shows 2.5 cm centrally depressed ulcerated lesion with heaped up circular margin, 8 cm from anal verge. Distally, just above the anal verge, suture line consistent with history of prior transanal polypectomy. -Pathology of the rectal mass consistent with adenocarcinoma. Splenic flexure polypectomy was tubular adenoma. Sigmoid colon biopsy was benign. -CT CAP with contrast on August 05, 2020 shows no evidence of neoplastic disease within the chest, abdomen or pelvis. Complicated left midpole renal cyst versus complex cystic mass. No lung masses. -Pelvis MRI on 08/27/2020 showed T3BN0 tumor. Distance of tumor/node from mesorectal fascia was 1 cm. Distance from tumor to the internal anal sphincter was 4.2 cm. Tumor size is 2.2 x 2.2 cm. -  Total neoadjuvant therapy with 4 months of FOLFOX followed by long course chemo RT with Xeloda followed by restaging pelvis MRI 6 to 8 weeks after completion of XRT followed by resection recommended. -7 cycles of FOLFOX from 09/16/2020 through 12/31/2020, cycle 8 discontinued due to poor tolerance. -XRT with Xeloda started on 03/18/2021.  2. Social/family history: -She lives at home  with her husband. She worked as a Counselling psychologist for 47 years. No history of smoking. -Maternal aunt had breast cancer.   PLAN:  1.Stage II (T3N0) rectal adenocarcinoma: -XRT started on 03/18/2021. -She has been taking 1 and half tablet of Xeloda twice daily. -I have recommended her to take 2 tablets of Xeloda in the morning and 1 tablet in the evening. -Reviewed her labs which showed normal LFTs and creatinine.  CBC was also normal. -Physical examination did not reveal any mucositis or hand-foot skin reaction. -She has 10 more days of radiation.  She will come back in 1 week with repeat labs and physical exam.  2. Left kidney lesion: -Follow-up with Dr. Alyson Ingles with MRI in 6 months.  3. Hypokalemia:  -Potassium today 3.3.  She will continue home potassium 20 mEq daily.  4. Diarrhea: -She reports that diarrhea is well controlled with Imodium.  She is taking 2 tablets at the first onset followed by 1 tablet after each watery bowel movement.   Orders placed this encounter:  No orders of the defined types were placed in this encounter.    [name], MD [place] [phone]   I, Milinda Antis, am acting as a scribe for Dr. Sanda Linger.  I, [name] MD, have reviewed the above documentation for accuracy and completeness, and I agree with the above.

## 2021-04-08 NOTE — Patient Instructions (Signed)
[place] at [facility] Discharge Instructions  You were seen today by Dr. Delton Coombes. He went over your recent results. Take Xeloda 2 tablets in the morning and 1 tablet in the evening. Continue taking Imodium daily as needed; take 2 tablets three times daily if you have breakthrough diarrhea. Take your potassium tablet once you get home today and continue taking 1 tablet daily. Dr. Delton Coombes will see you back in 1 week for labs and follow up.   Thank you for choosing [place] at [facility] to provide your oncology and hematology care.  To afford each patient quality time with our provider, please arrive at least 15 minutes before your scheduled appointment time.   If you have a lab appointment with the [facility] please come in thru the Main Entrance and check in at the main information desk  You need to re-schedule your appointment should you arrive 10 or more minutes late.  We strive to give you quality time with our providers, and arriving late affects you and other patients whose appointments are after yours.  Also, if you no show three or more times for appointments you may be dismissed from the clinic at the providers discretion.     Again, thank you for choosing [facility].  Our hope is that these requests will decrease the amount of time that you wait before being seen by our physicians.       _____________________________________________________________  Should you have questions after your visit to [facility], please contact our office at [phone] between the hours of 8:00 a.m. and 4:30 p.m.  Voicemails left after 4:00 p.m. will not be returned until the following business day.  For prescription refill requests, have your pharmacy contact our office and allow 72 hours.    Cancer Center Support Programs:   > Cancer Support Group  2nd Tuesday of the month 1pm-2pm, Journey Room

## 2021-04-15 ENCOUNTER — Inpatient Hospital Stay (HOSPITAL_COMMUNITY): Payer: Medicare Other

## 2021-04-15 ENCOUNTER — Other Ambulatory Visit: Payer: Self-pay

## 2021-04-15 ENCOUNTER — Inpatient Hospital Stay (HOSPITAL_BASED_OUTPATIENT_CLINIC_OR_DEPARTMENT_OTHER): Payer: Medicare Other | Admitting: Hematology

## 2021-04-15 VITALS — BP 129/74 | HR 85 | Temp 97.1°F | Resp 18 | Wt 157.1 lb

## 2021-04-15 DIAGNOSIS — C20 Malignant neoplasm of rectum: Secondary | ICD-10-CM

## 2021-04-15 LAB — CBC WITH DIFFERENTIAL/PLATELET
Abs Immature Granulocytes: 0.03 10*3/uL (ref 0.00–0.07)
Basophils Absolute: 0 10*3/uL (ref 0.0–0.1)
Basophils Relative: 0 %
Eosinophils Absolute: 0.2 10*3/uL (ref 0.0–0.5)
Eosinophils Relative: 4 %
HCT: 39.8 % (ref 36.0–46.0)
Hemoglobin: 13.1 g/dL (ref 12.0–15.0)
Immature Granulocytes: 1 %
Lymphocytes Relative: 12 %
Lymphs Abs: 0.6 10*3/uL — ABNORMAL LOW (ref 0.7–4.0)
MCH: 31.8 pg (ref 26.0–34.0)
MCHC: 32.9 g/dL (ref 30.0–36.0)
MCV: 96.6 fL (ref 80.0–100.0)
Monocytes Absolute: 0.5 10*3/uL (ref 0.1–1.0)
Monocytes Relative: 11 %
Neutro Abs: 3.5 10*3/uL (ref 1.7–7.7)
Neutrophils Relative %: 72 %
Platelets: 176 10*3/uL (ref 150–400)
RBC: 4.12 MIL/uL (ref 3.87–5.11)
RDW: 15.2 % (ref 11.5–15.5)
WBC: 4.9 10*3/uL (ref 4.0–10.5)
nRBC: 0 % (ref 0.0–0.2)

## 2021-04-15 LAB — COMPREHENSIVE METABOLIC PANEL
ALT: 23 U/L (ref 0–44)
AST: 29 U/L (ref 15–41)
Albumin: 4 g/dL (ref 3.5–5.0)
Alkaline Phosphatase: 69 U/L (ref 38–126)
Anion gap: 10 (ref 5–15)
BUN: 13 mg/dL (ref 8–23)
CO2: 25 mmol/L (ref 22–32)
Calcium: 9.7 mg/dL (ref 8.9–10.3)
Chloride: 105 mmol/L (ref 98–111)
Creatinine, Ser: 0.81 mg/dL (ref 0.44–1.00)
GFR, Estimated: >60 mL/min (ref >60)
Glucose, Bld: 91 mg/dL (ref 70–99)
Potassium: 3.3 mmol/L — ABNORMAL LOW (ref 3.5–5.1)
Sodium: 140 mmol/L (ref 135–145)
Total Bilirubin: 0.8 mg/dL (ref 0.3–1.2)
Total Protein: 6.9 g/dL (ref 6.5–8.1)

## 2021-04-15 LAB — MAGNESIUM: Magnesium: 1.6 mg/dL — ABNORMAL LOW (ref 1.7–2.4)

## 2021-04-15 NOTE — Progress Notes (Signed)
Brenda Mclean, [place] [address]   CLINIC:  Medical Oncology/Hematology  PCP:  Roderic Scarce, MD [address] [name] / [place] [address] [phone]   REASON FOR VISIT:  Follow-up for rectal cancer  PRIOR THERAPY:  1. Transanal resection of polyp on 10/2012. 2. FOLFOX and Aloxi x 7 cycles from 09/16/2020 to 12/31/2020.  NGS Results: Not done  CURRENT THERAPY: XRT with Xeloda BID (1,000 mg & 500 mg)  BRIEF ONCOLOGIC HISTORY:  Oncology History  Rectal cancer ([facility])  08/14/2020 Initial Diagnosis   Rectal cancer ([facility])   09/02/2020 Cancer Staging   Staging form: Colon and Rectum, AJCC 8th Edition - Clinical stage from 09/02/2020: Stage IIA (cT3, cN0, cM0) - Signed by [name], MD on 09/02/2020   09/16/2020 -  Chemotherapy   The patient had palonosetron (ALOXI) injection 0.25 mg, 0.25 mg, Intravenous,  Once, 7 of 7 cycles Administration: 0.25 mg (09/16/2020), 0.25 mg (10/07/2020), 0.25 mg (10/21/2020), 0.25 mg (11/04/2020), 0.25 mg (11/26/2020), 0.25 mg (12/10/2020), 0.25 mg (12/31/2020) pegfilgrastim-cbqv (UDENYCA) injection 6 mg, 6 mg, Subcutaneous, Once, 6 of 6 cycles Administration: 6 mg (10/09/2020), 6 mg (10/23/2020), 6 mg (11/06/2020), 6 mg (01/01/2021) leucovorin 700 mg in dextrose 5 % 250 mL infusion, 380 mg/m2 = 736 mg, Intravenous,  Once, 7 of 7 cycles Dose modification: 300 mg/m2 (original dose 400 mg/m2, Cycle 6, Reason: Provider Judgment) Administration: 700 mg (09/16/2020), 700 mg (10/07/2020), 700 mg (10/21/2020), 700 mg (11/04/2020), 700 mg (11/26/2020), 552 mg (12/10/2020), 552 mg (12/31/2020) oxaliplatin (ELOXATIN) 125 mg in dextrose 5 % 500 mL chemo infusion, 68 mg/m2 = 125 mg (80 % of original dose 85 mg/m2), Intravenous,  Once, 7 of 7 cycles Dose modification: 68 mg/m2 (80 % of original dose 85 mg/m2, Cycle 1, Reason: Provider Judgment), 68 mg/m2 (80 % of original dose 85 mg/m2, Cycle 2, Reason: Provider Judgment), 55 mg/m2  (original dose 85 mg/m2, Cycle 4, Reason: Other (see comments), Comment: neuropathy) Administration: 125 mg (09/16/2020), 125 mg (10/07/2020), 125 mg (10/21/2020), 100 mg (11/04/2020), 100 mg (11/26/2020), 100 mg (12/10/2020), 100 mg (12/31/2020) fluorouracil (ADRUCIL) chemo injection 750 mg, 400 mg/m2 = 750 mg, Intravenous,  Once, 7 of 7 cycles Dose modification: 300 mg/m2 (original dose 400 mg/m2, Cycle 6, Reason: Provider Judgment) Administration: 750 mg (09/16/2020), 750 mg (10/07/2020), 750 mg (10/21/2020), 750 mg (11/04/2020), 750 mg (11/26/2020), 550 mg (12/10/2020), 550 mg (12/31/2020) fluorouracil (ADRUCIL) 4,400 mg in sodium chloride 0.9 % 62 mL chemo infusion, 2,400 mg/m2 = 4,400 mg, Intravenous, 1 Day/Dose, 7 of 7 cycles Dose modification: 1,920 mg/m2 (80 % of original dose 2,400 mg/m2, Cycle 5, Reason: Other (see comments), Comment: diarrhea), 1,800 mg/m2 (original dose 2,400 mg/m2, Cycle 6, Reason: Provider Judgment) Administration: 4,400 mg (09/16/2020), 4,400 mg (10/07/2020), 4,400 mg (10/21/2020), 4,400 mg (11/04/2020), 3,550 mg (11/26/2020), 3,300 mg (12/10/2020), 3,300 mg (12/31/2020)  for chemotherapy treatment.      CANCER STAGING: Cancer Staging Rectal cancer [facility]) Staging form: Colon and Rectum, AJCC 8th Edition - Clinical stage from 09/02/2020: Stage IIA (cT3, cN0, cM0) - Signed by [name], MD on 09/02/2020   INTERVAL HISTORY:  Brenda Mclean, a 75 y.o. female, returns for routine follow-up of her rectal cancer. Brenda Mclean was last seen on 04/08/2021.   Today she reports feeling okay. She continues taking Xeloda 1,000 mg during the day and 500 mg in the evening along with her radiation. She reports having 4-5 episodes of soft BM's during the  day and takes 2 tablets Imodium in the morning and then 1 tablet BID to TID during the day. She continues taking magnesium QD and potassium. She denies having rectal pain or skin rashes.  She has about 8 to 9 more radiation treatments  left.   REVIEW OF SYSTEMS:  Review of Systems  Constitutional: Positive for appetite change (50%) and fatigue (50%).  Gastrointestinal: Negative for diarrhea (on Imodium) and rectal pain.  Skin: Negative for rash.  All other systems reviewed and are negative.   PAST MEDICAL/SURGICAL HISTORY:  Past Medical History:  Diagnosis Date  . Arthritis   . COVID-19 03/2019  . GERD (gastroesophageal reflux disease)   . HLD (hyperlipidemia)   . HTN (hypertension)   . Port-A-Cath in place 09/09/2020  . 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 Sigmoid diverticulitis 7858   Uncomplicated   Past Surgical History:  Procedure Laterality Date  . BIOPSY  07/10/2020   Procedure: BIOPSY;  Surgeon: [name], MD;  [facility]: [facility];  Service: Endoscopy;;  . CHOLECYSTECTOMY  1998  . COLON SURGERY  10/30/2012   Dr. Audrie Lia; transanal resection of 2 cm rectal polyp; pathology revealed superficially invasive moderately differentiated adenocarcinoma arising in adenomatous polyp of distal rectum.  . COLONOSCOPY  10/24/2013   Dr. West Carbo; with propofol; small granuloma and sutures distal rectum, small polyp mid rectum removed, sigmoid diverticulosis, otherwise normal exam.  No pathology received.  Recommended colonoscopy in 5 years.  . COLONOSCOPY  09/08/2012   Dr. West Carbo; with propofol; multi lobulated friable 3 cm mass distal rectum s/p multiple biopsies, scattered sigmoid diverticulosis, otherwise normal exam.  Pathology with adenomatous polyp.  . COLONOSCOPY WITH PROPOFOL N/A 07/10/2020   Procedure: COLONOSCOPY WITH PROPOFOL;  Surgeon: [name], MD;  Location: [facility];  Service: Endoscopy;  Laterality: N/A;  10:00am  . POLYPECTOMY  07/10/2020   Procedure: POLYPECTOMY;  Surgeon: [name], MD;  Location: [facility];  Service: Endoscopy;;  . PORTACATH PLACEMENT Left 09/09/2020   Procedure: INSERTION PORT-A-CATH;  Surgeon: Aviva Signs, MD;  Location: [facility];  Service:  General;  Laterality: Left;  . REPLACEMENT TOTAL HIP W/  RESURFACING IMPLANTS Bilateral   . 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 REPLACEMENT TOTAL KNEE Right 12/03/2014  . TOTAL SHOULDER ARTHROPLASTY  04/26/2018    SOCIAL HISTORY:  Social History   Socioeconomic History  . Marital status: Married    Spouse name: Not on file  . Number of children: 2  . Years of education: Not on file  . Highest education level: Not on file  Occupational History    Employer: retired  Tobacco Use  . Smoking status: Never Smoker  . Smokeless tobacco: Never Used  Vaping Use  . Vaping Use: Never used  Substance and Sexual Activity  . Alcohol use: Never  . Drug use: Never  . Sexual activity: Not Currently  Other Topics Concern  . Not on file  Social History Narrative  . Not on file   Social Determinants of Health   Financial Resource Strain: Low Risk   . Difficulty of Paying Living Expenses: Not hard at all  Food Insecurity: No Food Insecurity  . Worried About Charity fundraiser in the Last Year: Never true  . Ran Out of Food in the Last Year: Never true  Transportation Needs: No Transportation Needs  . Lack of Transportation (Medical): No  . Lack of Transportation (Non-Medical): No  Physical Activity: Inactive  . Days of  Exercise per Week: 0 days  . Minutes of Exercise per Session: 0 min  Stress: No Stress Concern Present  . Feeling of Stress : Only a little  Social Connections: Moderately Integrated  . Frequency of Communication with Friends and Family: More than three times a week  . Frequency of Social Gatherings with Friends and Family: More than three times a week  . Attends Religious Services: More than 4 times per year  . Active Member of Clubs or Organizations: No  . Attends Archivist Meetings: Never  . Marital Status: Married  Human resources officer Violence: Not At Risk  . Fear of Current or Ex-Partner: No  . Emotionally Abused: No  . Physically Abused: No  .  Sexually Abused: No    FAMILY HISTORY:  Family History  Problem Relation Age of Onset  . Arthritis Mother   . High blood pressure Mother   . Heart disease Father   . Breast cancer Maternal Aunt   . Stroke Maternal Grandmother   . Cancer Paternal Grandmother   . Diabetes Brother   . Colon cancer Neg Hx     CURRENT MEDICATIONS:  Current Outpatient Medications  Medication Sig Dispense Refill  . amoxicillin (AMOXIL) 500 MG capsule SMARTSIG:4 Capsule(s) By Mouth    . capecitabine (XELODA) 500 MG tablet Take 2 tablets (1,000 mg total) by mouth 2 (two) times daily after a meal. Take Monday- Friday. Take only on days of radiation. 120 tablet 0  . capecitabine (XELODA) 500 MG tablet TAKE 2 TABLETS (1,000 MG TOTAL) BY MOUTH 2 (TWO) TIMES DAILY AFTER A MEAL. [place]. TAKE ONLY ON DAYS OF RADIATION. 120 tablet 0  . celecoxib (CELEBREX) 200 MG capsule Take 200 mg by mouth daily.    . ciprofloxacin (CIPRO) 500 MG tablet Take 1 tablet (500 mg total) by mouth 2 (two) times daily. 14 tablet 0  . dexamethasone 10 mg in sodium chloride 0.9 % 50 mL Inject 10 mg into the vein every 14 (fourteen) days. Prior to chemotherapy administration    . feeding supplement (ENSURE ENLIVE / ENSURE PLUS) LIQD Take 237 mLs by mouth 2 (two) times daily between meals. 237 mL 12  . lidocaine-prilocaine (EMLA) cream Apply a small amount to port a cath site and cover with plastic wrap 1 hour prior to chemotherapy appointments 30 g 3  . omega-3 acid ethyl esters (LOVAZA) 1 g capsule Take 2 g by mouth daily.    Marland Kitchen omeprazole (PRILOSEC) 20 MG capsule Take 20 mg by mouth daily.    . potassium chloride SA (KLOR-CON) 20 MEQ tablet Take 1 tablet (20 mEq total) by mouth daily. 30 tablet 2  . prochlorperazine (COMPAZINE) 10 MG tablet Take 1 tablet (10 mg total) by mouth 2 (two) times daily. Take 30 minutes prior to Xeloda 60 tablet 1  . simvastatin (ZOCOR) 40 MG tablet Take 40 mg by mouth at bedtime.     . traMADol  (ULTRAM) 50 MG tablet Take 1 tablet (50 mg total) by mouth every 6 (six) hours as needed. 20 tablet 0  . Turmeric 500 MG TABS Take 500 mg by mouth daily.    . valsartan (DIOVAN) 320 MG tablet Take 320 mg by mouth daily.     No current facility-administered medications for this visit.   Facility-Administered Medications Ordered in Other Visits  Medication Dose Route Frequency Provider Last Rate Last Admin  . heparin lock flush 100 unit/mL  500 Units Intracatheter Once PRN [name], MD      .  sodium chloride flush (NS) 0.9 % injection 10 mL  10 mL Intracatheter PRN [name], MD   10 mL at 11/26/20 0950    ALLERGIES:  No Known Allergies  PHYSICAL EXAM:  Performance status (ECOG): 0 - Asymptomatic  Vitals:   04/15/21 1403  BP: 129/74  Pulse: 85  Resp: 18  Temp: (!) 97.1 F (36.2 C)  SpO2: 98%   Wt Readings from Last 3 Encounters:  04/15/21 157 lb 1.6 oz (71.3 kg)  04/08/21 157 lb 14.4 oz (71.6 kg)  04/02/21 159 lb 6.4 oz (72.3 kg)   Physical Exam Vitals reviewed.  Constitutional:      Appearance: Normal appearance.  Cardiovascular:     Rate and Rhythm: Normal rate and regular rhythm.     Pulses: Normal pulses.     Heart sounds: Normal heart sounds.  Pulmonary:     Effort: Pulmonary effort is normal.     Breath sounds: Normal breath sounds.  Musculoskeletal:     Right lower leg: No edema.     Left lower leg: No edema.  Skin:    Findings: No rash.  Neurological:     General: No focal deficit present.     Mental Status: She is alert and oriented to person, place, and time.  Psychiatric:        Mood and Affect: Mood normal.        Behavior: Behavior normal.      LABORATORY DATA:  I have reviewed the labs as listed.  CBC Latest Ref Rng & Units 04/15/2021 04/08/2021 04/02/2021  WBC 4.0 - 10.5 K/uL 4.9 4.8 5.3  Hemoglobin 12.0 - 15.0 g/dL 13.1 13.1 13.5  Hematocrit 36.0 - 46.0 % 39.8 40.9 41.1  Platelets 150 - 400 K/uL 176 165 157   CMP Latest  Ref Rng & Units 04/15/2021 04/08/2021 04/02/2021  Glucose 70 - 99 mg/dL 91 86 91  BUN 8 - 23 mg/dL 13 15 15   Creatinine 0.44 - 1.00 mg/dL 0.81 0.87 0.80  Sodium 135 - 145 mmol/L 140 141 141  Potassium 3.5 - 5.1 mmol/L 3.3(L) 3.3(L) 3.1(L)  Chloride 98 - 111 mmol/L 105 107 107  CO2 22 - 32 mmol/L 25 24 24   Calcium 8.9 - 10.3 mg/dL 9.7 9.2 9.5  Total Protein 6.5 - 8.1 g/dL 6.9 6.9 6.9  Total Bilirubin 0.3 - 1.2 mg/dL 0.8 0.8 0.8  Alkaline Phos 38 - 126 U/L 69 75 76  AST 15 - 41 U/L 29 21 18   ALT 0 - 44 U/L 23 15 15     DIAGNOSTIC IMAGING:  I have independently reviewed the scans and discussed with the patient. No results found.   ASSESSMENT:  1. Rectal adenocarcinoma: -History of rectal polyp with adenocarcinoma, status post transanal resection of the polyp in November 2013. -Colonoscopy on July 10, 2020 shows 2.5 cm centrally depressed ulcerated lesion with heaped up circular margin, 8 cm from anal verge. Distally, just above the anal verge, suture line consistent with history of prior transanal polypectomy. -Pathology of the rectal mass consistent with adenocarcinoma. Splenic flexure polypectomy was tubular adenoma. Sigmoid colon biopsy was benign. -CT CAP with contrast on August 05, 2020 shows no evidence of neoplastic disease within the chest, abdomen or pelvis. Complicated left midpole renal cyst versus complex cystic mass. No lung masses. -Pelvis MRI on 08/27/2020 showed T3BN0 tumor. Distance of tumor/node from mesorectal fascia was 1 cm. Distance from tumor to the internal anal sphincter was 4.2 cm. Tumor size is 2.2 x  2.2 cm. -Total neoadjuvant therapy with 4 months of FOLFOX followed by long course chemo RT with Xeloda followed by restaging pelvis MRI 6 to 8 weeks after completion of XRT followed by resection recommended. -7 cycles of FOLFOX from 09/16/2020 through 12/31/2020, cycle 8 discontinued due to poor tolerance. -XRT with Xeloda started on 03/18/2021.  2. Social/family  history: -She lives at home with her husband. She worked as a Counselling psychologist for 47 years. No history of smoking. -Maternal aunt had breast cancer.   PLAN:  1.Stage II (T3N0) rectal adenocarcinoma: -She is taking 1 and half tablets of Xeloda twice daily. - I have recommended her to take 2 tablets of Xeloda in the morning and 1 tablet in the evening. - Reviewed her labs today which showed normal LFTs.  Magnesium is slightly low at 1.6.  CBC was normal. - No signs or symptoms of hand-foot skin reaction. - Recommend continue Xeloda Monday through Friday and RTC in 1 week.  2. Left kidney lesion: -Follow-up with Dr. Alyson Ingles with MRI in 6 months.  3. Hypokalemia:  -Potassium 3.3 today.  Will increase potassium 20 mEq twice daily.  4. Diarrhea: -Continue Imodium 2 tablets at the first onset of diarrhea followed by 1 tablet after each watery bowel movement.   Orders placed this encounter:  Orders Placed This Encounter  Procedures  . CBC with Differential/Platelet  . Comprehensive metabolic panel  . Magnesium     [name], MD [place] [phone]   I, Milinda Antis, am acting as a scribe for Dr. Sanda Linger.  I, [name] MD, have reviewed the above documentation for accuracy and completeness, and I agree with the above.

## 2021-04-15 NOTE — Patient Instructions (Signed)
[place] at [facility] Discharge Instructions  You were seen today by Dr. Delton Coombes. He went over your recent results. Continue going to radiation and taking Xeloda 1,000 mg in the morning and 500 mg in the evening. Start taking magnesium twice daily; if your diarrhea worsens, take just once a day. You will be scheduled to have an MRI of your pelvis done before your next visit. Dr. Delton Coombes will see you back in 1 week for labs and follow up.   Thank you for choosing [place] at [facility] to provide your oncology and hematology care.  To afford each patient quality time with our provider, please arrive at least 15 minutes before your scheduled appointment time.   If you have a lab appointment with the [facility] please come in thru the Main Entrance and check in at the main information desk  You need to re-schedule your appointment should you arrive 10 or more minutes late.  We strive to give you quality time with our providers, and arriving late affects you and other patients whose appointments are after yours.  Also, if you no show three or more times for appointments you may be dismissed from the clinic at the providers discretion.     Again, thank you for choosing [facility]  Our hope is that these requests will decrease the amount of time that you wait before being seen by our physicians.       _____________________________________________________________  Should you have questions after your visit to [facility], please contact our office at [phone] between the hours of 8:00 a.m. and 4:30 p.m.  Voicemails left after 4:00 p.m. will not be returned until the following business day.  For prescription refill requests, have your pharmacy contact our office and allow 72 hours.    Cancer Center Support Programs:   > Cancer Support Group  2nd Tuesday of the month 1pm-2pm, Journey Room

## 2021-04-22 ENCOUNTER — Inpatient Hospital Stay (HOSPITAL_COMMUNITY): Payer: Medicare Other

## 2021-04-22 ENCOUNTER — Other Ambulatory Visit: Payer: Self-pay

## 2021-04-22 ENCOUNTER — Inpatient Hospital Stay (HOSPITAL_BASED_OUTPATIENT_CLINIC_OR_DEPARTMENT_OTHER): Payer: Medicare Other | Admitting: Hematology

## 2021-04-22 VITALS — BP 114/68 | HR 78 | Temp 97.0°F | Resp 18 | Wt 155.7 lb

## 2021-04-22 DIAGNOSIS — C20 Malignant neoplasm of rectum: Secondary | ICD-10-CM

## 2021-04-22 LAB — COMPREHENSIVE METABOLIC PANEL
ALT: 38 U/L (ref 0–44)
AST: 37 U/L (ref 15–41)
Albumin: 3.9 g/dL (ref 3.5–5.0)
Alkaline Phosphatase: 85 U/L (ref 38–126)
Anion gap: 9 (ref 5–15)
BUN: 14 mg/dL (ref 8–23)
CO2: 27 mmol/L (ref 22–32)
Calcium: 9.4 mg/dL (ref 8.9–10.3)
Chloride: 105 mmol/L (ref 98–111)
Creatinine, Ser: 0.79 mg/dL (ref 0.44–1.00)
GFR, Estimated: >60 mL/min (ref >60)
Glucose, Bld: 97 mg/dL (ref 70–99)
Potassium: 3.9 mmol/L (ref 3.5–5.1)
Sodium: 141 mmol/L (ref 135–145)
Total Bilirubin: 0.6 mg/dL (ref 0.3–1.2)
Total Protein: 6.8 g/dL (ref 6.5–8.1)

## 2021-04-22 LAB — CBC WITH DIFFERENTIAL/PLATELET
Abs Immature Granulocytes: 0.02 10*3/uL (ref 0.00–0.07)
Basophils Absolute: 0 10*3/uL (ref 0.0–0.1)
Basophils Relative: 1 %
Eosinophils Absolute: 0.1 10*3/uL (ref 0.0–0.5)
Eosinophils Relative: 3 %
HCT: 40.2 % (ref 36.0–46.0)
Hemoglobin: 12.9 g/dL (ref 12.0–15.0)
Immature Granulocytes: 0 %
Lymphocytes Relative: 6 %
Lymphs Abs: 0.4 10*3/uL — ABNORMAL LOW (ref 0.7–4.0)
MCH: 31.5 pg (ref 26.0–34.0)
MCHC: 32.1 g/dL (ref 30.0–36.0)
MCV: 98 fL (ref 80.0–100.0)
Monocytes Absolute: 0.6 10*3/uL (ref 0.1–1.0)
Monocytes Relative: 11 %
Neutro Abs: 4.5 10*3/uL (ref 1.7–7.7)
Neutrophils Relative %: 79 %
Platelets: 161 10*3/uL (ref 150–400)
RBC: 4.1 MIL/uL (ref 3.87–5.11)
RDW: 16.2 % — ABNORMAL HIGH (ref 11.5–15.5)
WBC: 5.6 10*3/uL (ref 4.0–10.5)
nRBC: 0 % (ref 0.0–0.2)

## 2021-04-22 LAB — MAGNESIUM: Magnesium: 1.7 mg/dL (ref 1.7–2.4)

## 2021-04-22 NOTE — Patient Instructions (Signed)
[place] at [facility] Discharge Instructions  You were seen and examined today by Dr. Delton Coombes.  Please follow up as scheduled in one week.   Thank you for choosing [place] at [facility] to provide your oncology and hematology care.  To afford each patient quality time with our provider, please arrive at least 15 minutes before your scheduled appointment time.   If you have a lab appointment with the [facility] please come in thru the Main Entrance and check in at the main information desk.  You need to re-schedule your appointment should you arrive 10 or more minutes late.  We strive to give you quality time with our providers, and arriving late affects you and other patients whose appointments are after yours.  Also, if you no show three or more times for appointments you may be dismissed from the clinic at the providers discretion.     Again, thank you for choosing [facility].  Our hope is that these requests will decrease the amount of time that you wait before being seen by our physicians.       _____________________________________________________________  Should you have questions after your visit to [facility], please contact our office at [phone] and follow the prompts.  Our office hours are 8:00 a.m. and 4:30 p.m. Monday - Friday.  Please note that voicemails left after 4:00 p.m. may not be returned until the following business day.  We are closed weekends and major holidays.  You do have access to a nurse 24-7, just call the main number to the clinic [phone] and do not press any options, hold on the line and a nurse will answer the phone.    For prescription refill requests, have your pharmacy contact our office and allow 72 hours.    Due to Covid, you will need to wear a mask upon entering the hospital. If you do not have a mask, a mask will be given to you at the Main Entrance upon  arrival. For doctor visits, patients may have 1 support person age 6 or older with them. For treatment visits, patients can not have anyone with them due to social distancing guidelines and our immunocompromised population.

## 2021-04-22 NOTE — Progress Notes (Signed)
[facility] [address] [place], [place] [id number]   CLINIC:  Medical Oncology/Hematology  PCP:  Virgina NorfolkBarker, Wendy L, MD [address] [place] [id number] [phone]   REASON FOR VISIT:  Follow-up for rectal cancer  PRIOR THERAPY:  1. Transanal resection of polyp on 10/2012. 2. FOLFOX and Aloxi x 7 cycles from 09/16/2020 to 12/31/2020.  NGS Results: Not done  CURRENT THERAPY: XRT with Xeloda BID (1,000 mg & 500 mg)  BRIEF ONCOLOGIC HISTORY:  Oncology History  Rectal cancer (HCC)  08/14/2020 Initial Diagnosis   Rectal cancer (HCC)   09/02/2020 Cancer Staging   Staging form: Colon and Rectum, AJCC 8th Edition - Clinical stage from 09/02/2020: Stage IIA (cT3, cN0, cM0) - Signed by Doreatha MassedKatragadda, Savahna Casados, MD on 09/02/2020   09/16/2020 -  Chemotherapy   The patient had palonosetron (ALOXI) injection 0.25 mg, 0.25 mg, Intravenous,  Once, 7 of 7 cycles Administration: 0.25 mg (09/16/2020), 0.25 mg (10/07/2020), 0.25 mg (10/21/2020), 0.25 mg (11/04/2020), 0.25 mg (11/26/2020), 0.25 mg (12/10/2020), 0.25 mg (12/31/2020) pegfilgrastim-cbqv (UDENYCA) injection 6 mg, 6 mg, Subcutaneous, Once, 6 of 6 cycles Administration: 6 mg (10/09/2020), 6 mg (10/23/2020), 6 mg (11/06/2020), 6 mg (01/01/2021) leucovorin 700 mg in dextrose 5 % 250 mL infusion, 380 mg/m2 = 736 mg, Intravenous,  Once, 7 of 7 cycles Dose modification: 300 mg/m2 (original dose 400 mg/m2, Cycle 6, Reason: Provider Judgment) Administration: 700 mg (09/16/2020), 700 mg (10/07/2020), 700 mg (10/21/2020), 700 mg (11/04/2020), 700 mg (11/26/2020), 552 mg (12/10/2020), 552 mg (12/31/2020) oxaliplatin (ELOXATIN) 125 mg in dextrose 5 % 500 mL chemo infusion, 68 mg/m2 = 125 mg (80 % of original dose 85 mg/m2), Intravenous,  Once, 7 of 7 cycles Dose modification: 68 mg/m2 (80 % of original dose 85 mg/m2, Cycle 1, Reason: Provider Judgment), 68 mg/m2 (80 % of original dose 85 mg/m2, Cycle 2, Reason: Provider Judgment), 55 mg/m2  (original dose 85 mg/m2, Cycle 4, Reason: Other (see comments), Comment: neuropathy) Administration: 125 mg (09/16/2020), 125 mg (10/07/2020), 125 mg (10/21/2020), 100 mg (11/04/2020), 100 mg (11/26/2020), 100 mg (12/10/2020), 100 mg (12/31/2020) fluorouracil (ADRUCIL) chemo injection 750 mg, 400 mg/m2 = 750 mg, Intravenous,  Once, 7 of 7 cycles Dose modification: 300 mg/m2 (original dose 400 mg/m2, Cycle 6, Reason: Provider Judgment) Administration: 750 mg (09/16/2020), 750 mg (10/07/2020), 750 mg (10/21/2020), 750 mg (11/04/2020), 750 mg (11/26/2020), 550 mg (12/10/2020), 550 mg (12/31/2020) fluorouracil (ADRUCIL) 4,400 mg in sodium chloride 0.9 % 62 mL chemo infusion, 2,400 mg/m2 = 4,400 mg, Intravenous, 1 Day/Dose, 7 of 7 cycles Dose modification: 1,920 mg/m2 (80 % of original dose 2,400 mg/m2, Cycle 5, Reason: Other (see comments), Comment: diarrhea), 1,800 mg/m2 (original dose 2,400 mg/m2, Cycle 6, Reason: Provider Judgment) Administration: 4,400 mg (09/16/2020), 4,400 mg (10/07/2020), 4,400 mg (10/21/2020), 4,400 mg (11/04/2020), 3,550 mg (11/26/2020), 3,300 mg (12/10/2020), 3,300 mg (12/31/2020)  for chemotherapy treatment.      CANCER STAGING: Cancer Staging Rectal cancer [facility](HCC) Staging form: Colon and Rectum, AJCC 8th Edition - Clinical stage from 09/02/2020: Stage IIA (cT3, cN0, cM0) - Signed by Doreatha MassedKatragadda, Rajvi Armentor, MD on 09/02/2020   INTERVAL HISTORY:  Ms. Barb MerinoBetty H Manzi, a 75 y.o. female, seen for follow-up of her rectal cancer. She is taking Xeloda 1000 mg in the morning and 500 mg in the evening.  She reportedly went to radiation yesterday and was evaluated by Dr. Jannet MantisQ and was told to have burns in the area.  Radiation is being held this  week.  She is taking Imodium 2 tablets at the first onset of diarrhea followed by 1 tablet after each watery bowel movement.  She is taking total of 6 tablets/day.  Denied any nausea or vomiting.  She will have radiation started back next Monday for the whole  week.   REVIEW OF SYSTEMS:  Review of Systems  Constitutional: Positive for appetite change (50%). Fatigue: 50%  Gastrointestinal: Positive for diarrhea (on Imodium). Negative for rectal pain.  Skin: Negative for rash.  All other systems reviewed and are negative.   PAST MEDICAL/SURGICAL HISTORY:  Past Medical History:  Diagnosis Date  . Arthritis   . COVID-19 03/2019  . GERD (gastroesophageal reflux disease)   . HLD (hyperlipidemia)   . HTN (hypertension)   . Port-A-Cath in place 09/09/2020  . 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 Sigmoid diverticulitis 6270   Uncomplicated   Past Surgical History:  Procedure Laterality Date  . BIOPSY  07/10/2020   Procedure: BIOPSY;  Surgeon: [name], MD;  Location: [facility];  Service: Endoscopy;;  . CHOLECYSTECTOMY  1998  . COLON SURGERY  10/30/2012   Dr. Audrie Lia; transanal resection of 2 cm rectal polyp; pathology revealed superficially invasive moderately differentiated adenocarcinoma arising in adenomatous polyp of distal rectum.  . COLONOSCOPY  10/24/2013   Dr. West Carbo; with propofol; small granuloma and sutures distal rectum, small polyp mid rectum removed, sigmoid diverticulosis, otherwise normal exam.  No pathology received.  Recommended colonoscopy in 5 years.  . COLONOSCOPY  09/08/2012   Dr. West Carbo; with propofol; multi lobulated friable 3 cm mass distal rectum s/p multiple biopsies, scattered sigmoid diverticulosis, otherwise normal exam.  Pathology with adenomatous polyp.  . COLONOSCOPY WITH PROPOFOL N/A 07/10/2020   Procedure: COLONOSCOPY WITH PROPOFOL;  Surgeon: [name], MD;  Location: [facility];  Service: Endoscopy;  Laterality: N/A;  10:00am  . POLYPECTOMY  07/10/2020   Procedure: POLYPECTOMY;  Surgeon: [name], MD;  Location: [facility];  Service: Endoscopy;;  . PORTACATH PLACEMENT Left 09/09/2020   Procedure: INSERTION PORT-A-CATH;  Surgeon: Aviva Signs, MD;  Location: [facility];  Service:  General;  Laterality: Left;  . REPLACEMENT TOTAL HIP W/  RESURFACING IMPLANTS Bilateral   . 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 REPLACEMENT TOTAL KNEE Right 12/03/2014  . TOTAL SHOULDER ARTHROPLASTY  04/26/2018    SOCIAL HISTORY:  Social History   Socioeconomic History  . Marital status: Married    Spouse name: Not on file  . Number of children: 2  . Years of education: Not on file  . Highest education level: Not on file  Occupational History    Employer: retired  Tobacco Use  . Smoking status: Never Smoker  . Smokeless tobacco: Never Used  Vaping Use  . Vaping Use: Never used  Substance and Sexual Activity  . Alcohol use: Never  . Drug use: Never  . Sexual activity: Not Currently  Other Topics Concern  . Not on file  Social History Narrative  . Not on file   Social Determinants of Health   Financial Resource Strain: Low Risk   . Difficulty of Paying Living Expenses: Not hard at all  Food Insecurity: No Food Insecurity  . Worried About Charity fundraiser in the Last Year: Never true  . Ran Out of Food in the Last Year: Never true  Transportation Needs: No Transportation Needs  . Lack of Transportation (Medical): No  . Lack of Transportation (Non-Medical): No  Physical Activity:  Inactive  . Days of Exercise per Week: 0 days  . Minutes of Exercise per Session: 0 min  Stress: No Stress Concern Present  . Feeling of Stress : Only a little  Social Connections: Moderately Integrated  . Frequency of Communication with Friends and Family: More than three times a week  . Frequency of Social Gatherings with Friends and Family: More than three times a week  . Attends Religious Services: More than 4 times per year  . Active Member of Clubs or Organizations: No  . Attends Archivist Meetings: Never  . Marital Status: Married  Human resources officer Violence: Not At Risk  . Fear of Current or Ex-Partner: No  . Emotionally Abused: No  . Physically Abused: No  .  Sexually Abused: No    FAMILY HISTORY:  Family History  Problem Relation Age of Onset  . Arthritis Mother   . High blood pressure Mother   . Heart disease Father   . Breast cancer Maternal Aunt   . Stroke Maternal Grandmother   . Cancer Paternal Grandmother   . Diabetes Brother   . Colon cancer Neg Hx     CURRENT MEDICATIONS:  Current Outpatient Medications  Medication Sig Dispense Refill  . amoxicillin (AMOXIL) 500 MG capsule SMARTSIG:4 Capsule(s) By Mouth    . capecitabine (XELODA) 500 MG tablet Take 2 tablets (1,000 mg total) by mouth 2 (two) times daily after a meal. Take Monday- Friday. Take only on days of radiation. 120 tablet 0  . capecitabine (XELODA) 500 MG tablet TAKE 2 TABLETS (1,000 MG TOTAL) BY MOUTH 2 (TWO) TIMES DAILY AFTER A MEAL. [place]. TAKE ONLY ON DAYS OF RADIATION. 120 tablet 0  . celecoxib (CELEBREX) 200 MG capsule Take 200 mg by mouth daily.    . ciprofloxacin (CIPRO) 500 MG tablet Take 1 tablet (500 mg total) by mouth 2 (two) times daily. 14 tablet 0  . dexamethasone 10 mg in sodium chloride 0.9 % 50 mL Inject 10 mg into the vein every 14 (fourteen) days. Prior to chemotherapy administration    . feeding supplement (ENSURE ENLIVE / ENSURE PLUS) LIQD Take 237 mLs by mouth 2 (two) times daily between meals. 237 mL 12  . lidocaine-prilocaine (EMLA) cream Apply a small amount to port a cath site and cover with plastic wrap 1 hour prior to chemotherapy appointments 30 g 3  . omega-3 acid ethyl esters (LOVAZA) 1 g capsule Take 2 g by mouth daily.    Marland Kitchen omeprazole (PRILOSEC) 20 MG capsule Take 20 mg by mouth daily.    . potassium chloride SA (KLOR-CON) 20 MEQ tablet Take 1 tablet (20 mEq total) by mouth daily. 30 tablet 2  . prochlorperazine (COMPAZINE) 10 MG tablet Take 1 tablet (10 mg total) by mouth 2 (two) times daily. Take 30 minutes prior to Xeloda 60 tablet 1  . simvastatin (ZOCOR) 40 MG tablet Take 40 mg by mouth at bedtime.     . traMADol  (ULTRAM) 50 MG tablet Take 1 tablet (50 mg total) by mouth every 6 (six) hours as needed. 20 tablet 0  . Turmeric 500 MG TABS Take 500 mg by mouth daily.    . valsartan (DIOVAN) 320 MG tablet Take 320 mg by mouth daily.     No current facility-administered medications for this visit.   Facility-Administered Medications Ordered in Other Visits  Medication Dose Route Frequency Provider Last Rate Last Admin  . heparin lock flush 100 unit/mL  500 Units Intracatheter  Once PRN [name], MD      . sodium chloride flush (NS) 0.9 % injection 10 mL  10 mL Intracatheter PRN [name], MD   10 mL at 11/26/20 0950    ALLERGIES:  No Known Allergies  PHYSICAL EXAM:  Performance status (ECOG): 0 - Asymptomatic  Vitals:   04/22/21 1043  BP: 114/68  Pulse: 78  Resp: 18  Temp: (!) 97 F (36.1 C)  SpO2: 100%   Wt Readings from Last 3 Encounters:  04/22/21 155 lb 11.2 oz (70.6 kg)  04/15/21 157 lb 1.6 oz (71.3 kg)  04/08/21 157 lb 14.4 oz (71.6 kg)   Physical Exam Vitals reviewed.  Constitutional:      Appearance: Normal appearance.  Cardiovascular:     Rate and Rhythm: Normal rate and regular rhythm.     Pulses: Normal pulses.     Heart sounds: Normal heart sounds.  Pulmonary:     [place]: Pulmonary effort is normal.     Breath sounds: Normal breath sounds.  Musculoskeletal:     Right lower leg: No edema.     Left lower leg: No edema.  Skin:    Findings: No rash.  Neurological:     General: No focal deficit present.     Mental Status: She is alert and oriented to person, place, and time.  Psychiatric:        Mood and Affect: Mood normal.        Behavior: Behavior normal.      LABORATORY DATA:  I have reviewed the labs as listed.  CBC Latest Ref Rng & Units 04/22/2021 04/15/2021 04/08/2021  WBC 4.0 - 10.5 K/uL 5.6 4.9 4.8  Hemoglobin 12.0 - 15.0 g/dL 12.9 13.1 13.1  Hematocrit 36.0 - 46.0 % 40.2 39.8 40.9  Platelets 150 - 400 K/uL 161 176 165   CMP  Latest Ref Rng & Units 04/22/2021 04/15/2021 04/08/2021  Glucose 70 - 99 mg/dL 97 91 86  BUN 8 - 23 mg/dL 14 13 15   Creatinine 0.44 - 1.00 mg/dL 0.79 0.81 0.87  Sodium 135 - 145 mmol/L 141 140 141  Potassium 3.5 - 5.1 mmol/L 3.9 3.3(L) 3.3(L)  Chloride 98 - 111 mmol/L 105 105 107  CO2 22 - 32 mmol/L 27 25 24   Calcium 8.9 - 10.3 mg/dL 9.4 9.7 9.2  Total Protein 6.5 - 8.1 g/dL 6.8 6.9 6.9  Total Bilirubin 0.3 - 1.2 mg/dL 0.6 0.8 0.8  Alkaline Phos 38 - 126 U/L 85 69 75  AST 15 - 41 U/L 37 29 21  ALT 0 - 44 U/L 38 23 15    DIAGNOSTIC IMAGING:  I have independently reviewed the scans and discussed with the patient. No results found.   ASSESSMENT:  1. Rectal adenocarcinoma: -History of rectal polyp with adenocarcinoma, status post transanal resection of the polyp in November 2013. -Colonoscopy on July 10, 2020 shows 2.5 cm centrally depressed ulcerated lesion with heaped up circular margin, 8 cm from anal verge. Distally, just above the anal verge, suture line consistent with history of prior transanal polypectomy. -Pathology of the rectal mass consistent with adenocarcinoma. Splenic flexure polypectomy was tubular adenoma. Sigmoid colon biopsy was benign. -CT CAP with contrast on August 05, 2020 shows no evidence of neoplastic disease within the chest, abdomen or pelvis. Complicated left midpole renal cyst versus complex cystic mass. No lung masses. -Pelvis MRI on 08/27/2020 showed T3BN0 tumor. Distance of tumor/node from mesorectal fascia was 1 cm. Distance from tumor to the  internal anal sphincter was 4.2 cm. Tumor size is 2.2 x 2.2 cm. -Total neoadjuvant therapy with 4 months of FOLFOX followed by long course chemo RT with Xeloda followed by restaging pelvis MRI 6 to 8 weeks after completion of XRT followed by resection recommended. -7 cycles of FOLFOX from 09/16/2020 through 12/31/2020, cycle 8 discontinued due to poor tolerance. -XRT with Xeloda started on 03/18/2021.  2.  Social/family history: -She lives at home with her husband. She worked as a Counselling psychologist for 47 years. No history of smoking. -Maternal aunt had breast cancer.   PLAN:  1.Stage II (T3N0) rectal adenocarcinoma: -She is taking Xeloda 1000 mg in the morning and 400 mg in the evening. - She has lost 2 pounds in the last 1 week.  She is drinking about 2 ensures per day.  She has eaten 1 meal yesterday.  I have encouraged her to eat more frequent small meals. - Physical examination today did not show any mucositis or hand-foot skin reaction. - Reviewed her labs today which showed normal chemistries and CBC. - I will hold her Xeloda this week. - Plan to restart Xeloda on 04/27/2021.  She was told to come back next week for follow-up with labs.  2. Left kidney lesion: -Follow-up with Dr. Alyson Ingles with an MRI in 6 months.  3. Hypokalemia:  -Potassium is 3.9. - Continue potassium 20 mEq twice daily.  4. Diarrhea: -Continue Imodium 2 tablets at the first onset followed by 1 tablet after each watery bowel movement.   Orders placed this encounter:  Orders Placed This Encounter  Procedures  . CBC with Differential/Platelet  . Comprehensive metabolic panel  . Magnesium     [name], MD [place] [phone]   I, Milinda Antis, am acting as a scribe for Dr. Sanda Linger.  I, [name] MD, have reviewed the above documentation for accuracy and completeness, and I agree with the above.

## 2021-04-30 ENCOUNTER — Other Ambulatory Visit: Payer: Self-pay

## 2021-04-30 ENCOUNTER — Inpatient Hospital Stay (HOSPITAL_COMMUNITY): Payer: Medicare Other

## 2021-04-30 ENCOUNTER — Inpatient Hospital Stay (HOSPITAL_COMMUNITY): Payer: Medicare Other | Attending: Hematology | Admitting: Hematology

## 2021-04-30 ENCOUNTER — Ambulatory Visit: Payer: Medicare Other | Admitting: Urology

## 2021-04-30 VITALS — BP 127/87 | HR 76 | Temp 97.8°F | Resp 18 | Wt 152.0 lb

## 2021-04-30 DIAGNOSIS — R197 Diarrhea, unspecified: Secondary | ICD-10-CM | POA: Diagnosis not present

## 2021-04-30 DIAGNOSIS — N289 Disorder of kidney and ureter, unspecified: Secondary | ICD-10-CM | POA: Insufficient documentation

## 2021-04-30 DIAGNOSIS — E876 Hypokalemia: Secondary | ICD-10-CM | POA: Insufficient documentation

## 2021-04-30 DIAGNOSIS — C20 Malignant neoplasm of rectum: Secondary | ICD-10-CM | POA: Diagnosis present

## 2021-04-30 LAB — COMPREHENSIVE METABOLIC PANEL
ALT: 23 U/L (ref 0–44)
AST: 24 U/L (ref 15–41)
Albumin: 3.9 g/dL (ref 3.5–5.0)
Alkaline Phosphatase: 80 U/L (ref 38–126)
Anion gap: 8 (ref 5–15)
BUN: 14 mg/dL (ref 8–23)
CO2: 29 mmol/L (ref 22–32)
Calcium: 9.5 mg/dL (ref 8.9–10.3)
Chloride: 104 mmol/L (ref 98–111)
Creatinine, Ser: 0.83 mg/dL (ref 0.44–1.00)
GFR, Estimated: >60 mL/min (ref >60)
Glucose, Bld: 124 mg/dL — ABNORMAL HIGH (ref 70–99)
Potassium: 3.4 mmol/L — ABNORMAL LOW (ref 3.5–5.1)
Sodium: 141 mmol/L (ref 135–145)
Total Bilirubin: 0.7 mg/dL (ref 0.3–1.2)
Total Protein: 6.9 g/dL (ref 6.5–8.1)

## 2021-04-30 LAB — CBC WITH DIFFERENTIAL/PLATELET
Abs Immature Granulocytes: 0.01 10*3/uL (ref 0.00–0.07)
Basophils Absolute: 0 10*3/uL (ref 0.0–0.1)
Basophils Relative: 1 %
Eosinophils Absolute: 0.1 10*3/uL (ref 0.0–0.5)
Eosinophils Relative: 2 %
HCT: 39.8 % (ref 36.0–46.0)
Hemoglobin: 12.9 g/dL (ref 12.0–15.0)
Immature Granulocytes: 0 %
Lymphocytes Relative: 8 %
Lymphs Abs: 0.3 10*3/uL — ABNORMAL LOW (ref 0.7–4.0)
MCH: 31.8 pg (ref 26.0–34.0)
MCHC: 32.4 g/dL (ref 30.0–36.0)
MCV: 98 fL (ref 80.0–100.0)
Monocytes Absolute: 0.4 10*3/uL (ref 0.1–1.0)
Monocytes Relative: 10 %
Neutro Abs: 3.2 10*3/uL (ref 1.7–7.7)
Neutrophils Relative %: 79 %
Platelets: 172 10*3/uL (ref 150–400)
RBC: 4.06 MIL/uL (ref 3.87–5.11)
RDW: 17.2 % — ABNORMAL HIGH (ref 11.5–15.5)
WBC: 4 10*3/uL (ref 4.0–10.5)
nRBC: 0 % (ref 0.0–0.2)

## 2021-04-30 LAB — MAGNESIUM: Magnesium: 1.8 mg/dL (ref 1.7–2.4)

## 2021-04-30 NOTE — Progress Notes (Signed)
[place], [place] [address]   CLINIC:  Medical Oncology/Hematology  PCP:  Roderic Scarce, MD [address] [name] / [place] [address] [phone]   REASON FOR VISIT:  Follow-up for rectal cancer  PRIOR THERAPY:  1. Transanal resection of polyp on 10/2012. 2. FOLFOX and Aloxi x 7 cycles from 09/16/2020 to 12/31/2020.  NGS Results: Not done  CURRENT THERAPY: XRT with Xeloda BID (1,000 mg & 500 mg)  BRIEF ONCOLOGIC HISTORY:  Oncology History  Rectal cancer ([place])  08/14/2020 Initial Diagnosis   Rectal cancer ([place])   09/02/2020 Cancer Staging   Staging form: Colon and Rectum, AJCC 8th Edition - Clinical stage from 09/02/2020: Stage IIA (cT3, cN0, cM0) - Signed by [name], MD on 09/02/2020   09/16/2020 -  Chemotherapy   The patient had palonosetron (ALOXI) injection 0.25 mg, 0.25 mg, Intravenous,  Once, 7 of 7 cycles Administration: 0.25 mg (09/16/2020), 0.25 mg (10/07/2020), 0.25 mg (10/21/2020), 0.25 mg (11/04/2020), 0.25 mg (11/26/2020), 0.25 mg (12/10/2020), 0.25 mg (12/31/2020) pegfilgrastim-cbqv (UDENYCA) injection 6 mg, 6 mg, Subcutaneous, Once, 6 of 6 cycles Administration: 6 mg (10/09/2020), 6 mg (10/23/2020), 6 mg (11/06/2020), 6 mg (01/01/2021) leucovorin 700 mg in dextrose 5 % 250 mL infusion, 380 mg/m2 = 736 mg, Intravenous,  Once, 7 of 7 cycles Dose modification: 300 mg/m2 (original dose 400 mg/m2, Cycle 6, Reason: Provider Judgment) Administration: 700 mg (09/16/2020), 700 mg (10/07/2020), 700 mg (10/21/2020), 700 mg (11/04/2020), 700 mg (11/26/2020), 552 mg (12/10/2020), 552 mg (12/31/2020) oxaliplatin (ELOXATIN) 125 mg in dextrose 5 % 500 mL chemo infusion, 68 mg/m2 = 125 mg (80 % of original dose 85 mg/m2), Intravenous,  Once, 7 of 7 cycles Dose modification: 68 mg/m2 (80 % of original dose 85 mg/m2, Cycle 1, Reason: Provider Judgment), 68 mg/m2 (80 % of original dose 85 mg/m2, Cycle 2, Reason: Provider Judgment), 55 mg/m2  (original dose 85 mg/m2, Cycle 4, Reason: Other (see comments), Comment: neuropathy) Administration: 125 mg (09/16/2020), 125 mg (10/07/2020), 125 mg (10/21/2020), 100 mg (11/04/2020), 100 mg (11/26/2020), 100 mg (12/10/2020), 100 mg (12/31/2020) fluorouracil (ADRUCIL) chemo injection 750 mg, 400 mg/m2 = 750 mg, Intravenous,  Once, 7 of 7 cycles Dose modification: 300 mg/m2 (original dose 400 mg/m2, Cycle 6, Reason: Provider Judgment) Administration: 750 mg (09/16/2020), 750 mg (10/07/2020), 750 mg (10/21/2020), 750 mg (11/04/2020), 750 mg (11/26/2020), 550 mg (12/10/2020), 550 mg (12/31/2020) fluorouracil (ADRUCIL) 4,400 mg in sodium chloride 0.9 % 62 mL chemo infusion, 2,400 mg/m2 = 4,400 mg, Intravenous, 1 Day/Dose, 7 of 7 cycles Dose modification: 1,920 mg/m2 (80 % of original dose 2,400 mg/m2, Cycle 5, Reason: Other (see comments), Comment: diarrhea), 1,800 mg/m2 (original dose 2,400 mg/m2, Cycle 6, Reason: Provider Judgment) Administration: 4,400 mg (09/16/2020), 4,400 mg (10/07/2020), 4,400 mg (10/21/2020), 4,400 mg (11/04/2020), 3,550 mg (11/26/2020), 3,300 mg (12/10/2020), 3,300 mg (12/31/2020)  for chemotherapy treatment.      CANCER STAGING: Cancer Staging Rectal cancer [facility]) Staging form: Colon and Rectum, AJCC 8th Edition - Clinical stage from 09/02/2020: Stage IIA (cT3, cN0, cM0) - Signed by [name], MD on 09/02/2020   INTERVAL HISTORY:  [name], a 75 y.o. female, returns for routine follow-up of her rectal cancer. Brenda Mclean was last seen on 04/22/2021.   Today she reports feeling okay. She started radiation on Monday along with Xeloda and is taking 1,000 mg in the AM and 500 mg in PM. She continues having diarrhea and is taking Imodium,  about 3 tablets per day, but denies N/V, mouth sores, abdominal pain or skin rashes. Her appetite is decreased and she is drinking 2-3 cans of Ensure daily. She continues taking potassium 20 mEq daily. She denies having numbness or pain in her  hands or feet.   REVIEW OF SYSTEMS:  Review of Systems  Constitutional: Positive for appetite change (25%) and fatigue (75%).  HENT:   Negative for mouth sores.   Gastrointestinal: Positive for diarrhea (on Imodium). Negative for abdominal pain, nausea and vomiting.  Skin: Negative for rash.  Neurological: Negative for numbness.  All other systems reviewed and are negative.   PAST MEDICAL/SURGICAL HISTORY:  Past Medical History:  Diagnosis Date  . Arthritis   . COVID-19 03/2019  . GERD (gastroesophageal reflux disease)   . HLD (hyperlipidemia)   . HTN (hypertension)   . Port-A-Cath in place 09/09/2020  . 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 Sigmoid diverticulitis [id number]   Uncomplicated   Past Surgical History:  Procedure Laterality Date  . BIOPSY  07/10/2020   Procedure: BIOPSY;  Surgeon: [name], MD;  [facility];  Service: Endoscopy;;  . CHOLECYSTECTOMY  1998  . COLON SURGERY  10/30/2012   Dr. Audrie Lia; transanal resection of 2 cm rectal polyp; pathology revealed superficially invasive moderately differentiated adenocarcinoma arising in adenomatous polyp of distal rectum.  . COLONOSCOPY  10/24/2013   Dr. West Carbo; with propofol; small granuloma and sutures distal rectum, small polyp mid rectum removed, sigmoid diverticulosis, otherwise normal exam.  No pathology received.  Recommended colonoscopy in 5 years.  . COLONOSCOPY  09/08/2012   Dr. West Carbo; with propofol; multi lobulated friable 3 cm mass distal rectum s/p multiple biopsies, scattered sigmoid diverticulosis, otherwise normal exam.  Pathology with adenomatous polyp.  . COLONOSCOPY WITH PROPOFOL N/A 07/10/2020   Procedure: COLONOSCOPY WITH PROPOFOL;  Surgeon: [name], MD;  Location: [facility];  Service: Endoscopy;  Laterality: N/A;  10:00am  . POLYPECTOMY  07/10/2020   Procedure: POLYPECTOMY;  Surgeon: [name], MD;  Location: [facility];  Service: Endoscopy;;  . PORTACATH  PLACEMENT Left 09/09/2020   Procedure: INSERTION PORT-A-CATH;  Surgeon: Aviva Signs, MD;  Location: [facility] ORS;  Service: General;  Laterality: Left;  . REPLACEMENT TOTAL HIP W/  RESURFACING IMPLANTS Bilateral   . 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 REPLACEMENT TOTAL KNEE Right 12/03/2014  . TOTAL SHOULDER ARTHROPLASTY  04/26/2018    SOCIAL HISTORY:  Social History   Socioeconomic History  . Marital status: Married    Spouse name: Not on file  . Number of children: 2  . Years of education: Not on file  . Highest education level: Not on file  Occupational History    Employer: retired  Tobacco Use  . Smoking status: Never Smoker  . Smokeless tobacco: Never Used  Vaping Use  . Vaping Use: Never used  Substance and Sexual Activity  . Alcohol use: Never  . Drug use: Never  . Sexual activity: Not Currently  Other Topics Concern  . Not on file  Social History Narrative  . Not on file   Social Determinants of Health   Financial Resource Strain: Low Risk   . Difficulty of Paying Living Expenses: Not hard at all  Food Insecurity: No Food Insecurity  . Worried About Charity fundraiser in the Last Year: Never true  . Ran Out of Food in the Last Year: Never true  Transportation Needs: No Transportation Needs  . Lack  of Transportation (Medical): No  . Lack of Transportation (Non-Medical): No  Physical Activity: Inactive  . Days of Exercise per Week: 0 days  . Minutes of Exercise per Session: 0 min  Stress: No Stress Concern Present  . Feeling of Stress : Only a little  Social Connections: Moderately Integrated  . Frequency of Communication with Friends and Family: More than three times a week  . Frequency of Social Gatherings with Friends and Family: More than three times a week  . Attends Religious Services: More than 4 times per year  . Active Member of Clubs or Organizations: No  . Attends Archivist Meetings: Never  . Marital Status: Married  Arboriculturist Violence: Not At Risk  . Fear of Current or Ex-Partner: No  . Emotionally Abused: No  . Physically Abused: No  . Sexually Abused: No    FAMILY HISTORY:  Family History  Problem Relation Age of Onset  . Arthritis Mother   . High blood pressure Mother   . Heart disease Father   . Breast cancer Maternal Aunt   . Stroke Maternal Grandmother   . Cancer Paternal Grandmother   . Diabetes Brother   . Colon cancer Neg Hx     CURRENT MEDICATIONS:  Current Outpatient Medications  Medication Sig Dispense Refill  . amoxicillin (AMOXIL) 500 MG capsule SMARTSIG:4 Capsule(s) By Mouth    . capecitabine (XELODA) 500 MG tablet Take 2 tablets (1,000 mg total) by mouth 2 (two) times daily after a meal. Take Monday- Friday. Take only on days of radiation. 120 tablet 0  . capecitabine (XELODA) 500 MG tablet TAKE 2 TABLETS (1,000 MG TOTAL) BY MOUTH 2 (TWO) TIMES DAILY AFTER A MEAL. [place]. TAKE ONLY ON DAYS OF RADIATION. 120 tablet 0  . celecoxib (CELEBREX) 200 MG capsule Take 200 mg by mouth daily.    . ciprofloxacin (CIPRO) 500 MG tablet Take 1 tablet (500 mg total) by mouth 2 (two) times daily. 14 tablet 0  . dexamethasone 10 mg in sodium chloride 0.9 % 50 mL Inject 10 mg into the vein every 14 (fourteen) days. Prior to chemotherapy administration    . feeding supplement (ENSURE ENLIVE / ENSURE PLUS) LIQD Take 237 mLs by mouth 2 (two) times daily between meals. 237 mL 12  . lidocaine-prilocaine (EMLA) cream Apply a small amount to port a cath site and cover with plastic wrap 1 hour prior to chemotherapy appointments 30 g 3  . omega-3 acid ethyl esters (LOVAZA) 1 g capsule Take 2 g by mouth daily.    Marland Kitchen omeprazole (PRILOSEC) 20 MG capsule Take 20 mg by mouth daily.    . potassium chloride SA (KLOR-CON) 20 MEQ tablet Take 1 tablet (20 mEq total) by mouth daily. 30 tablet 2  . prochlorperazine (COMPAZINE) 10 MG tablet Take 1 tablet (10 mg total) by mouth 2 (two) times daily. Take 30  minutes prior to Xeloda 60 tablet 1  . simvastatin (ZOCOR) 40 MG tablet Take 40 mg by mouth at bedtime.     . SSD 1 % cream Apply topically 3 (three) times daily.    . traMADol (ULTRAM) 50 MG tablet Take 1 tablet (50 mg total) by mouth every 6 (six) hours as needed. 20 tablet 0  . Turmeric 500 MG TABS Take 500 mg by mouth daily.    . valsartan (DIOVAN) 320 MG tablet Take 320 mg by mouth daily.     No current facility-administered medications for this visit.  Facility-Administered Medications Ordered in Other Visits  Medication Dose Route Frequency Provider Last Rate Last Admin  . heparin lock flush 100 unit/mL  500 Units Intracatheter Once PRN [name], MD      . sodium chloride flush (NS) 0.9 % injection 10 mL  10 mL Intracatheter PRN [name], MD   10 mL at 11/26/20 0950    ALLERGIES:  No Known Allergies  PHYSICAL EXAM:  Performance status (ECOG): 0 - Asymptomatic  Vitals:   04/30/21 0939  BP: 127/87  Pulse: 76  Resp: 18  Temp: 97.8 F (36.6 C)  SpO2: 96%   Wt Readings from Last 3 Encounters:  04/30/21 152 lb (68.9 kg)  04/22/21 155 lb 11.2 oz (70.6 kg)  04/15/21 157 lb 1.6 oz (71.3 kg)   Physical Exam Vitals reviewed.  Constitutional:      Appearance: Normal appearance.  Cardiovascular:     Rate and Rhythm: Normal rate and regular rhythm.     Pulses: Normal pulses.     Heart sounds: Normal heart sounds.  Pulmonary:     Effort: Pulmonary effort is normal.     Breath sounds: Normal breath sounds.  Musculoskeletal:     Right lower leg: No edema.     Left lower leg: No edema.  Neurological:     General: No focal deficit present.     Mental Status: She is alert and oriented to person, place, and time.  Psychiatric:        Mood and Affect: Mood normal.        Behavior: Behavior normal.      LABORATORY DATA:  I have reviewed the labs as listed.  CBC Latest Ref Rng & Units 04/30/2021 04/22/2021 04/15/2021  WBC 4.0 - 10.5 K/uL 4.0 5.6 4.9   Hemoglobin 12.0 - 15.0 g/dL 12.9 12.9 13.1  Hematocrit 36.0 - 46.0 % 39.8 40.2 39.8  Platelets 150 - 400 K/uL 172 161 176   CMP Latest Ref Rng & Units 04/30/2021 04/22/2021 04/15/2021  Glucose 70 - 99 mg/dL 124(H) 97 91  BUN 8 - 23 mg/dL 14 14 13   Creatinine 0.44 - 1.00 mg/dL 0.83 0.79 0.81  Sodium 135 - 145 mmol/L 141 141 140  Potassium 3.5 - 5.1 mmol/L 3.4(L) 3.9 3.3(L)  Chloride 98 - 111 mmol/L 104 105 105  CO2 22 - 32 mmol/L 29 27 25   Calcium 8.9 - 10.3 mg/dL 9.5 9.4 9.7  Total Protein 6.5 - 8.1 g/dL 6.9 6.8 6.9  Total Bilirubin 0.3 - 1.2 mg/dL 0.7 0.6 0.8  Alkaline Phos 38 - 126 U/L 80 85 69  AST 15 - 41 U/L 24 37 29  ALT 0 - 44 U/L 23 38 23    DIAGNOSTIC IMAGING:  I have independently reviewed the scans and discussed with the patient. No results found.   ASSESSMENT:  1. Rectal adenocarcinoma: -History of rectal polyp with adenocarcinoma, status post transanal resection of the polyp in November 2013. -Colonoscopy on July 10, 2020 shows 2.5 cm centrally depressed ulcerated lesion with heaped up circular margin, 8 cm from anal verge. Distally, just above the anal verge, suture line consistent with history of prior transanal polypectomy. -Pathology of the rectal mass consistent with adenocarcinoma. Splenic flexure polypectomy was tubular adenoma. Sigmoid colon biopsy was benign. -CT CAP with contrast on August 05, 2020 shows no evidence of neoplastic disease within the chest, abdomen or pelvis. Complicated left midpole renal cyst versus complex cystic mass. No lung masses. -Pelvis MRI on 08/27/2020 showed  T3BN0 tumor. Distance of tumor/node from mesorectal fascia was 1 cm. Distance from tumor to the internal anal sphincter was 4.2 cm. Tumor size is 2.2 x 2.2 cm. -Total neoadjuvant therapy with 4 months of FOLFOX followed by long course chemo RT with Xeloda followed by restaging pelvis MRI 6 to 8 weeks after completion of XRT followed by resection recommended. -7 cycles of  FOLFOX from 09/16/2020 through 12/31/2020, cycle 8 discontinued due to poor tolerance. -XRT with Xeloda from 03/18/2021 through 05/01/2021.  2. Social/family history: -She lives at home with her husband. She worked as a Counselling psychologist for 47 years. No history of smoking. -Maternal aunt had breast cancer.   PLAN:  1.Stage II (T3N0) rectal adenocarcinoma: -She is started back Xeloda 1000 mg in the morning and 500 mg in the evening on 04/27/2021 with radiation. - Her appetite is decreased.  She is drinking about 3 cans of Ensure daily.  Denied any nausea or vomiting. - Reviewed labs today which showed normal LFTs.  CBC was grossly normal. - Recommend continuing Xeloda until Friday evening. - Recommend follow-up in 4 weeks with pelvic MRI and CT of the abdomen. - We will refer her back to Dr. Marcello Moores at that time.  2. Left kidney lesion: -Follow-up with Dr. Alyson Ingles with MRI in 6 months.  3. Hypokalemia:  -Continue potassium 20 mEq twice daily.  Potassium today is 3.4.  4. Diarrhea: -Continue Imodium 2 tablets at the first onset of diarrhea followed by 1 tablet after each watery bowel movement.  She is requiring 3 Imodium's per day.   Orders placed this encounter:  Orders Placed This Encounter  Procedures  . CT Abdomen W Contrast  . MR Pelvis W Contrast     [name], MD [place] [phone]   I, Milinda Antis, am acting as a scribe for Dr. Sanda Linger.  I, [name] MD, have reviewed the above documentation for accuracy and completeness, and I agree with the above.

## 2021-04-30 NOTE — Patient Instructions (Signed)
[place] at [facility] Discharge Instructions  You were seen today by Dr. Delton Coombes. He went over your recent results. Take your last dose of Xeloda on May 6th and STOP. You will be scheduled to have a CT scan of your abdomen and an MRI of your pelvis done before your next visit. Dr. Delton Coombes will see you back in 1 month for labs and follow up.   Thank you for choosing [place] at [facility] to provide your oncology and hematology care.  To afford each patient quality time with our provider, please arrive at least 15 minutes before your scheduled appointment time.   If you have a lab appointment with the [facility] please come in thru the Main Entrance and check in at the main information desk  You need to re-schedule your appointment should you arrive 10 or more minutes late.  We strive to give you quality time with our providers, and arriving late affects you and other patients whose appointments are after yours.  Also, if you no show three or more times for appointments you may be dismissed from the clinic at the providers discretion.     Again, thank you for choosing [facility].  Our hope is that these requests will decrease the amount of time that you wait before being seen by our physicians.       _____________________________________________________________  Should you have questions after your visit to [facility], please contact our office at [phone] between the hours of 8:00 a.m. and 4:30 p.m.  Voicemails left after 4:00 p.m. will not be returned until the following business day.  For prescription refill requests, have your pharmacy contact our office and allow 72 hours.    Cancer Center Support Programs:   > Cancer Support Group  2nd Tuesday of the month 1pm-2pm, Journey Room

## 2021-05-01 ENCOUNTER — Ambulatory Visit: Payer: Medicare Other | Admitting: Urology

## 2021-05-08 ENCOUNTER — Other Ambulatory Visit (HOSPITAL_COMMUNITY): Payer: Self-pay

## 2021-05-18 ENCOUNTER — Other Ambulatory Visit (HOSPITAL_COMMUNITY): Payer: Self-pay

## 2021-05-28 ENCOUNTER — Other Ambulatory Visit (HOSPITAL_COMMUNITY): Payer: Self-pay | Admitting: *Deleted

## 2021-05-28 DIAGNOSIS — C20 Malignant neoplasm of rectum: Secondary | ICD-10-CM

## 2021-05-29 ENCOUNTER — Other Ambulatory Visit: Payer: Self-pay

## 2021-05-29 ENCOUNTER — Ambulatory Visit (HOSPITAL_COMMUNITY)
Admission: RE | Admit: 2021-05-29 | Discharge: 2021-05-29 | Disposition: A | Discharge status: Home / Self Care | Payer: Medicare Other | Source: Ambulatory Visit | Attending: Hematology | Admitting: Hematology

## 2021-05-29 ENCOUNTER — Inpatient Hospital Stay (HOSPITAL_COMMUNITY): Payer: Medicare Other | Attending: Hematology

## 2021-05-29 DIAGNOSIS — Z85048 Personal history of other malignant neoplasm of rectum, rectosigmoid junction, and anus: Secondary | ICD-10-CM | POA: Diagnosis present

## 2021-05-29 DIAGNOSIS — C20 Malignant neoplasm of rectum: Secondary | ICD-10-CM

## 2021-05-29 DIAGNOSIS — R197 Diarrhea, unspecified: Secondary | ICD-10-CM | POA: Diagnosis not present

## 2021-05-29 DIAGNOSIS — E876 Hypokalemia: Secondary | ICD-10-CM | POA: Diagnosis not present

## 2021-05-29 LAB — CBC WITH DIFFERENTIAL/PLATELET
Abs Immature Granulocytes: 0.01 10*3/uL (ref 0.00–0.07)
Basophils Absolute: 0 10*3/uL (ref 0.0–0.1)
Basophils Relative: 1 %
Eosinophils Absolute: 0.2 10*3/uL (ref 0.0–0.5)
Eosinophils Relative: 3 %
HCT: 39.2 % (ref 36.0–46.0)
Hemoglobin: 12.6 g/dL (ref 12.0–15.0)
Immature Granulocytes: 0 %
Lymphocytes Relative: 16 %
Lymphs Abs: 1 10*3/uL (ref 0.7–4.0)
MCH: 31.9 pg (ref 26.0–34.0)
MCHC: 32.1 g/dL (ref 30.0–36.0)
MCV: 99.2 fL (ref 80.0–100.0)
Monocytes Absolute: 0.5 10*3/uL (ref 0.1–1.0)
Monocytes Relative: 9 %
Neutro Abs: 4.4 10*3/uL (ref 1.7–7.7)
Neutrophils Relative %: 71 %
Platelets: 157 10*3/uL (ref 150–400)
RBC: 3.95 MIL/uL (ref 3.87–5.11)
RDW: 18.2 % — ABNORMAL HIGH (ref 11.5–15.5)
WBC: 6.1 10*3/uL (ref 4.0–10.5)
nRBC: 0 % (ref 0.0–0.2)

## 2021-05-29 LAB — COMPREHENSIVE METABOLIC PANEL
ALT: 17 U/L (ref 0–44)
AST: 21 U/L (ref 15–41)
Albumin: 3.9 g/dL (ref 3.5–5.0)
Alkaline Phosphatase: 74 U/L (ref 38–126)
Anion gap: 8 (ref 5–15)
BUN: 15 mg/dL (ref 8–23)
CO2: 27 mmol/L (ref 22–32)
Calcium: 9.2 mg/dL (ref 8.9–10.3)
Chloride: 106 mmol/L (ref 98–111)
Creatinine, Ser: 0.82 mg/dL (ref 0.44–1.00)
GFR, Estimated: >60 mL/min (ref >60)
Glucose, Bld: 98 mg/dL (ref 70–99)
Potassium: 3.2 mmol/L — ABNORMAL LOW (ref 3.5–5.1)
Sodium: 141 mmol/L (ref 135–145)
Total Bilirubin: 0.7 mg/dL (ref 0.3–1.2)
Total Protein: 6.8 g/dL (ref 6.5–8.1)

## 2021-05-29 LAB — MAGNESIUM: Magnesium: 1.9 mg/dL (ref 1.7–2.4)

## 2021-05-29 IMAGING — CT CT ABDOMEN W/ CM
2 of 5 series · 15 of 46 positions shown, 17 images · IV contrast (omnipaque)
Comparison: CT abdomen pelvis, [DATE], MR abdomen, [DATE]

CLINICAL DATA: Rectal cancer restaging, ongoing chemotherapy

EXAM:
CT ABDOMEN WITH CONTRAST
TECHNIQUE: Multidetector CT imaging of the abdomen was performed using the
standard protocol following bolus administration of intravenous
contrast.
CONTRAST:  75mL OMNIPAQUE IOHEXOL 300 MG/ML  SOLN

[Series 2: axial st · axial · 0.70mm/px · z∈[+1135,+1340]mm · 12 of 49 slices shown, 14 images]
[im 4/49  soft-tissue]
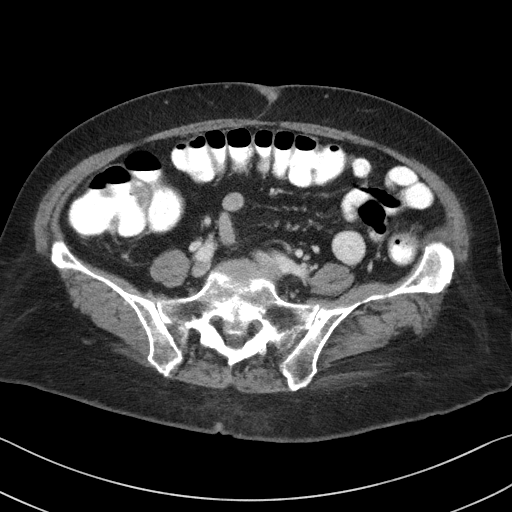
[im 4/49  bone]
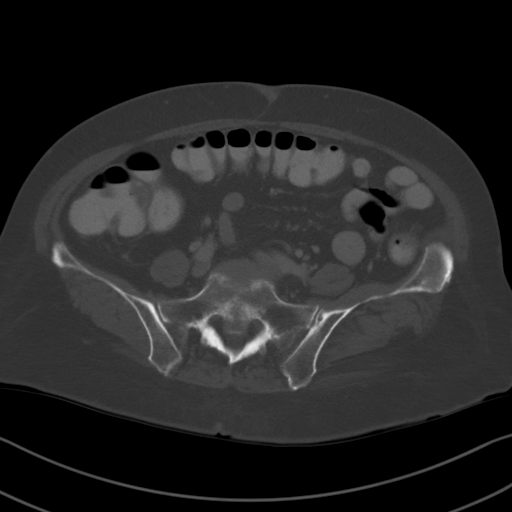
[im 8/49  soft-tissue]
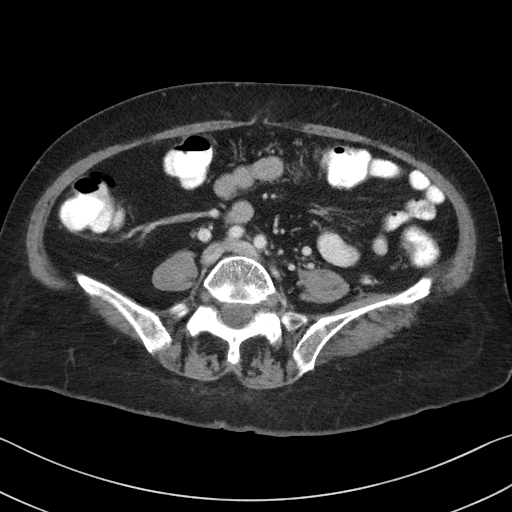
[im 12/49  soft-tissue]
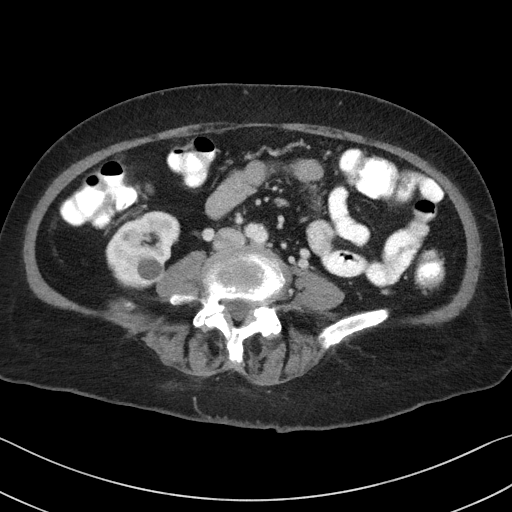
[im 15/49  soft-tissue]
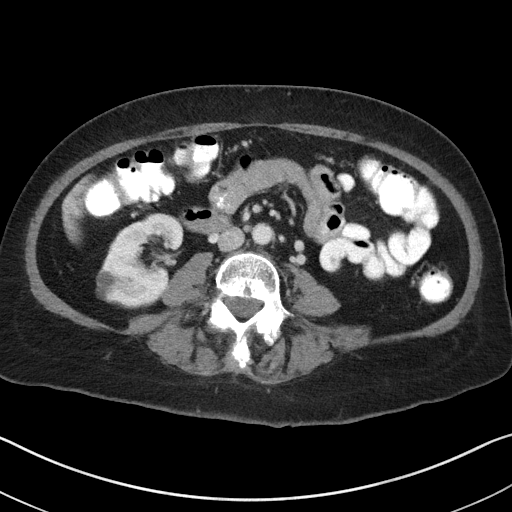
[im 19/49  soft-tissue]
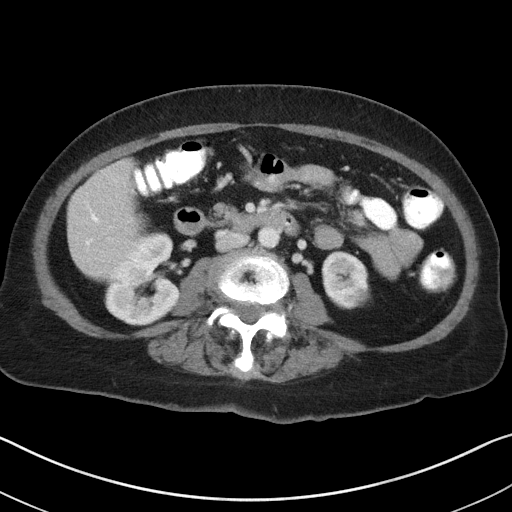
[im 23/49  soft-tissue]
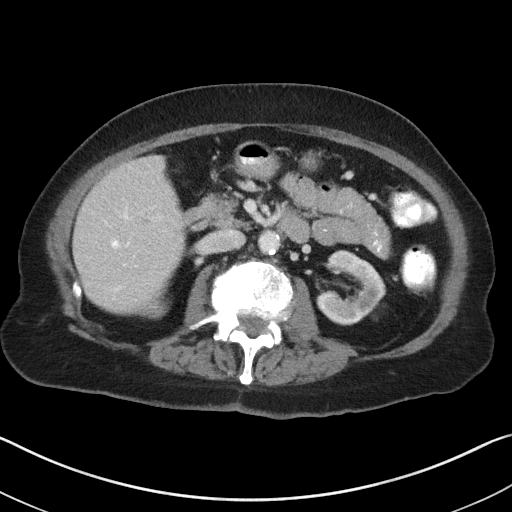
[im 26/49  soft-tissue]
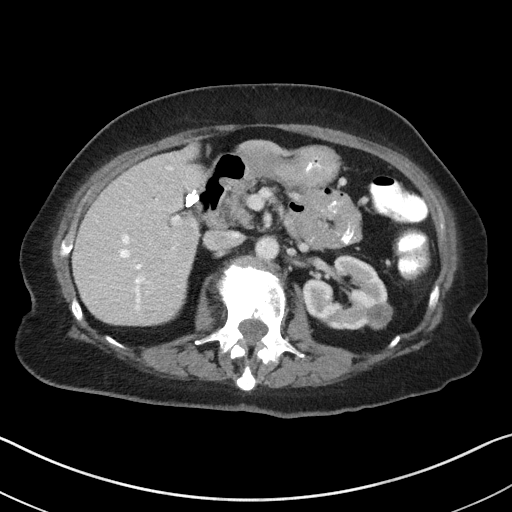
[im 30/49  soft-tissue]
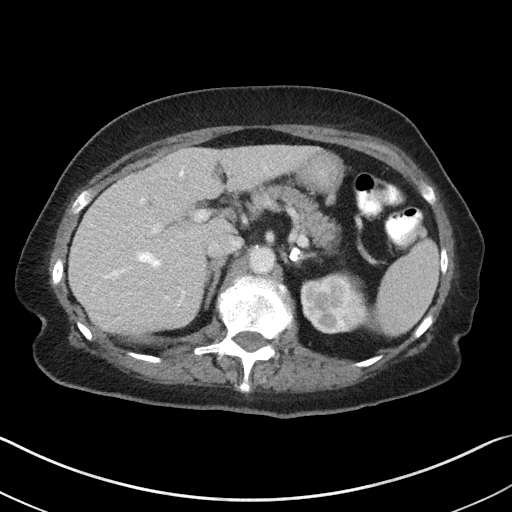
[im 34/49  soft-tissue]
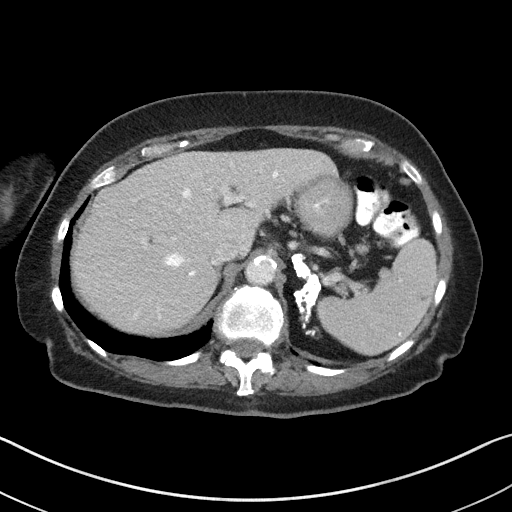
[im 34/49  bone]
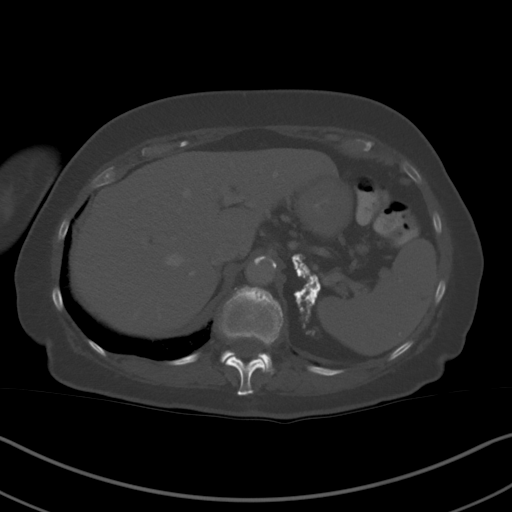
[im 37/49  soft-tissue]
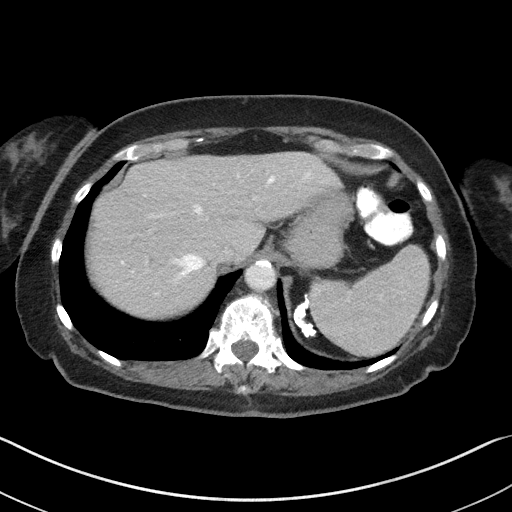
[im 41/49  soft-tissue]
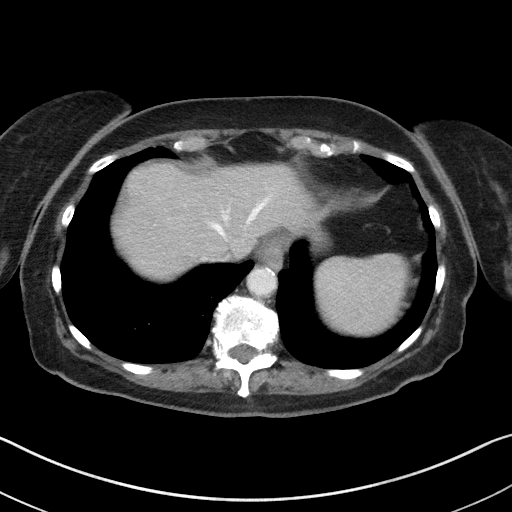
[im 45/49  soft-tissue]
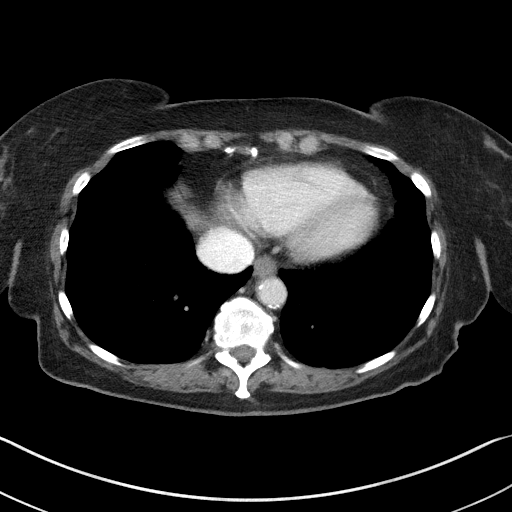

[Series 5: coronal st · coronal · 0.50mm/px · 3 of 88 slices shown]
[im 30/88  soft-tissue]
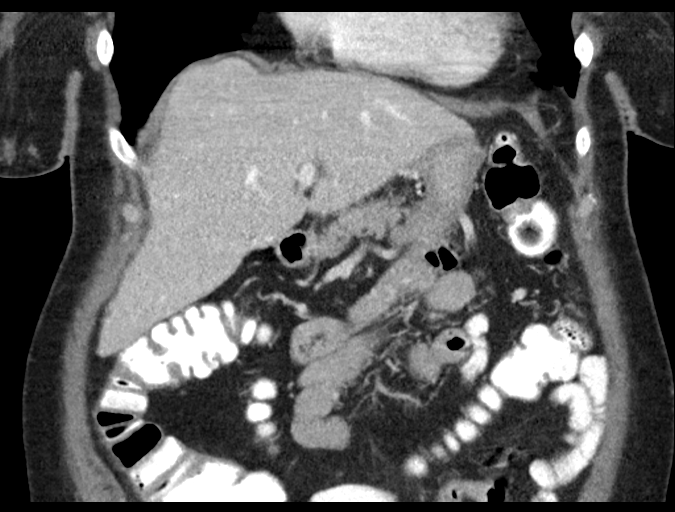
[im 39/88  soft-tissue]
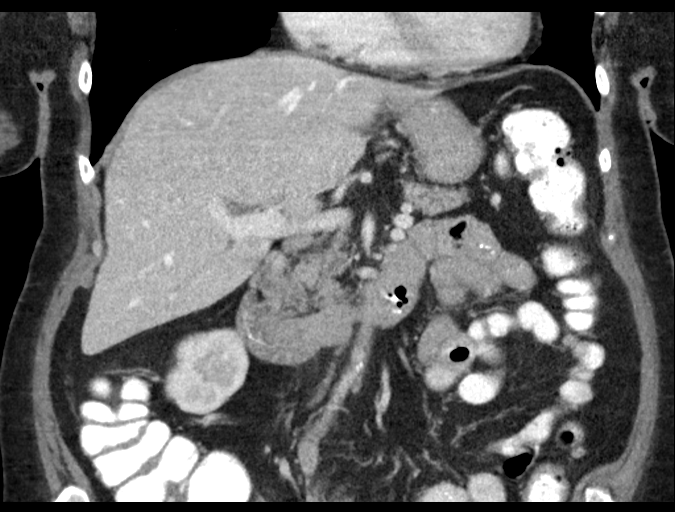
[im 49/88  soft-tissue]
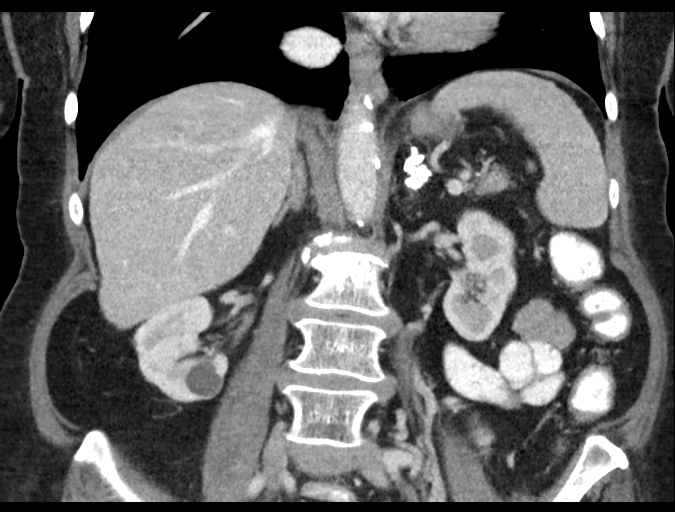

[15 of 46 positions shown; findings below may reference images not displayed]

FINDINGS: Lower chest: No acute abnormality.

Hepatobiliary: No focal liver abnormality is seen. Status post
cholecystectomy. No biliary dilatation.

Pancreas: Unremarkable. No pancreatic ductal dilatation or
surrounding inflammatory changes.

Spleen: Normal in size without focal abnormality.

Adrenals/Urinary Tract: There is an irregular calcification in the
expected vicinity of the left adrenal gland, perhaps related to
prior hemorrhage (series 2, image 15). The right adrenal gland is
normal. Multiple small bilateral renal cysts. Of note, an exophytic
cyst of the lateral midportion of the left kidney previously
characterized as a Bosniak III lesion is substantially diminished in
size, now measuring 1.8 x 1.5 cm, previously 3.4 x 2.7 cm,
consistent with a resolving hemorrhagic cyst (series 2, image 25).
Kidneys are otherwise normal, without renal calculi, focal lesion,
or hydronephrosis. Bladder is unremarkable.

Stomach/Bowel: Stomach is within normal limits. Appendix appears
normal. No evidence of bowel wall thickening, distention, or
inflammatory changes.

Vascular/Lymphatic: Aortic atherosclerosis. No enlarged abdominal
lymph nodes.

Other: No abdominal wall hernia or abnormality. No abdominopelvic
ascites.

Musculoskeletal: No acute or significant osseous findings.
IMPRESSION: 1. No evidence of metastatic disease in the abdomen.
2. An exophytic cyst of the lateral midportion of the left kidney
previously characterized as a Bosniak III lesion is substantially
diminished in size, consistent with a resolving hemorrhagic cyst. No
further follow-up or characterization is required for this benign
lesion.

Aortic Atherosclerosis ([H3]-[H3]).

## 2021-05-29 IMAGING — MR MR PELVIS W/O CM
10 series · 48 of 48 positions shown · non-contrast
Comparison: MRI [DATE]

CLINICAL DATA: Follow-up rectal carcinoma.

EXAM:
MRI PELVIS WITHOUT CONTRAST
TECHNIQUE: Multiplanar multisequence MR imaging of the pelvis was performed. No
intravenous contrast was administered. Rectal gel administered.
Diffusion-weighted imaging degraded by hip prosthetic.

[Series 6: T2 · sagittal · 3.0mm · 1.19mm/px · 5 of 35 slices shown (1 of 5)]
[im 1/35]
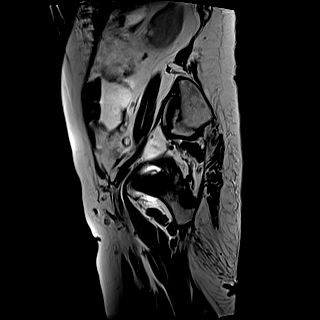
[im 9/35]
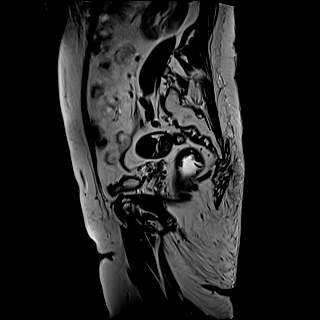
[im 18/35]
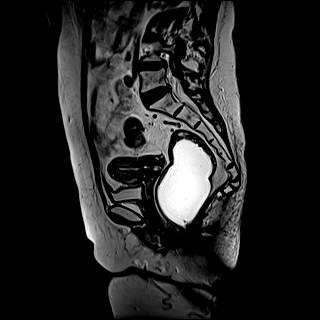
[im 26/35]
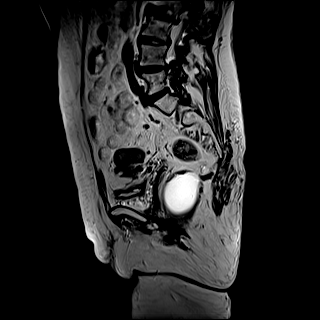
[im 35/35]
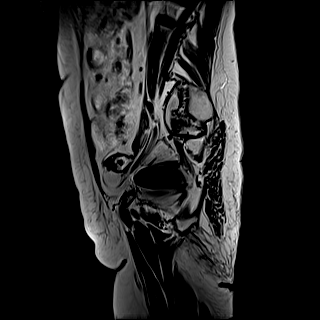

[Series 7: T2 · axial · 5.0mm · 1.32mm/px · z∈[-131,+113]mm · 5 of 42 slices shown (2 of 5)]
[im 1/42]
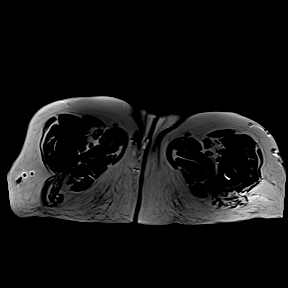
[im 11/42]
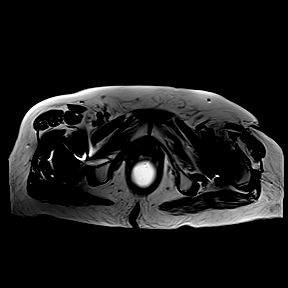
[im 21/42]
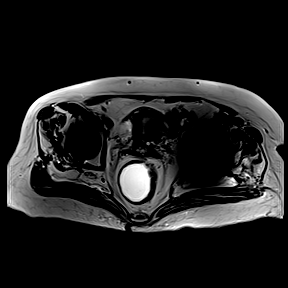
[im 31/42]
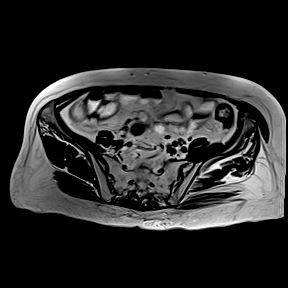
[im 42/42]
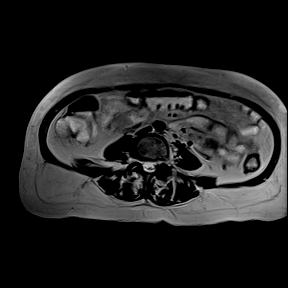

[Series 8: T2 · coronal · 3.0mm · 0.69mm/px · 7 of 55 slices shown (3 of 5)]
[im 1/55]
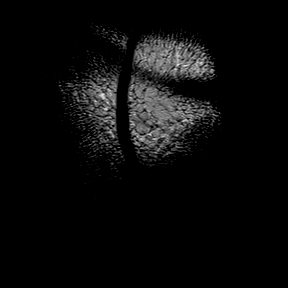
[im 10/55]
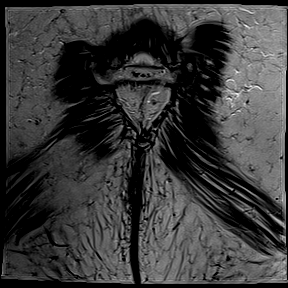
[im 19/55]
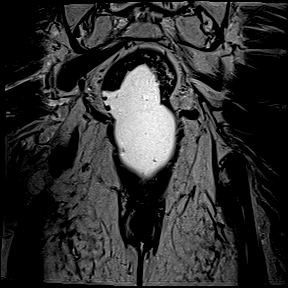
[im 28/55]
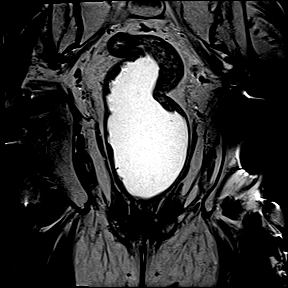
[im 37/55]
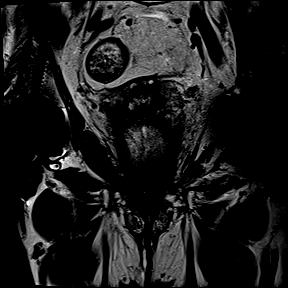
[im 46/55]
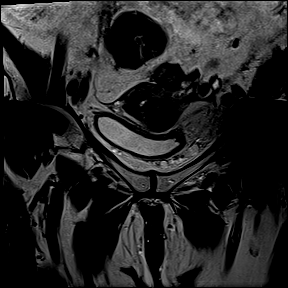
[im 55/55]
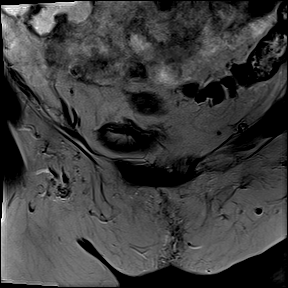

[Series 9: ax dwi_tracew · axial · 5.0mm · 1.48mm/px · z∈[-114,+50]mm · 4 of 34 slices shown (1 of 3)]
[im 1/34]
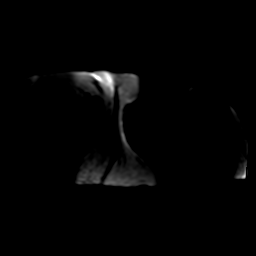
[im 12/34]
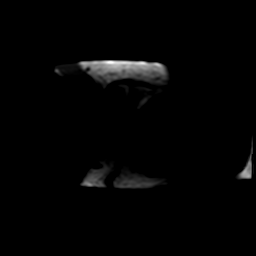
[im 23/34]
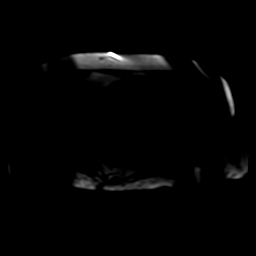
[im 34/34]
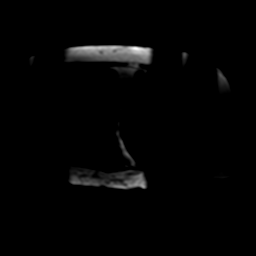

[Series 9: ax dwi_tracew · axial · 5.0mm · 1.48mm/px · z∈[-114,+50]mm · 4 of 34 slices shown (2 of 3)]
[im 1/34]
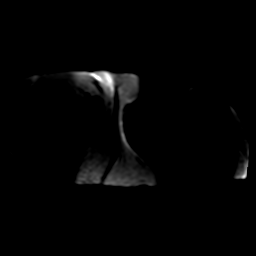
[im 12/34]
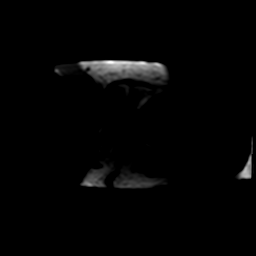
[im 23/34]
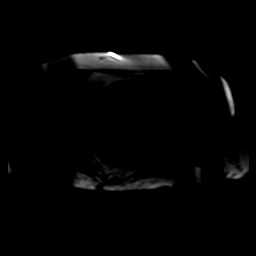
[im 34/34]
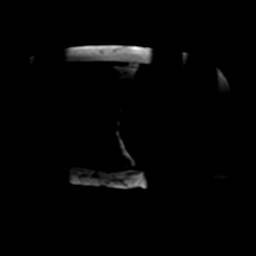

[Series 9: ax dwi_tracew · axial · 5.0mm · 1.48mm/px · z∈[-114,+50]mm · 4 of 34 slices shown (3 of 3)]
[im 1/34]
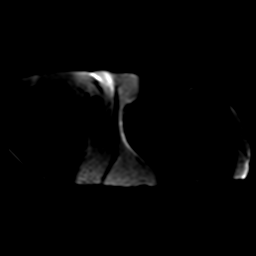
[im 12/34]
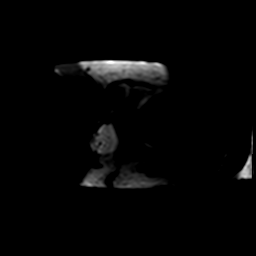
[im 23/34]
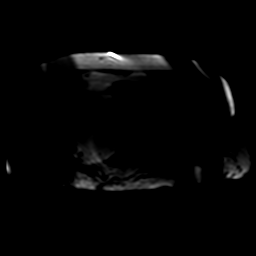
[im 34/34]
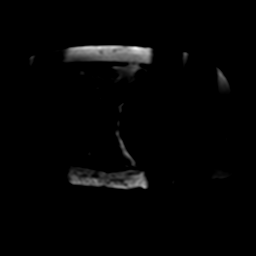

[Series 10: ax dwi_adc · axial · 5.0mm · 1.48mm/px · z∈[-114,+50]mm · 4 of 34 slices shown]
[im 1/34]
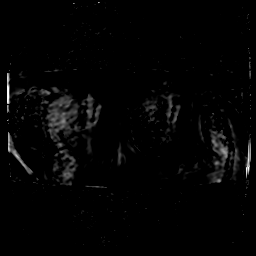
[im 12/34]
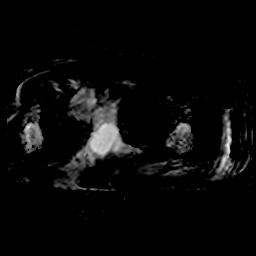
[im 23/34]
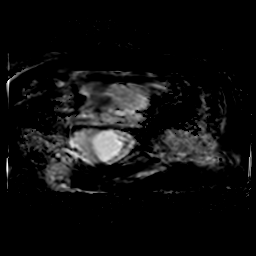
[im 34/34]
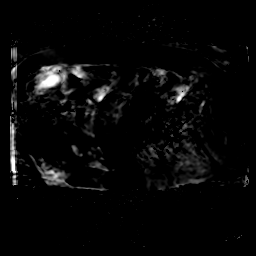

[Series 11: ax dwi_calc_bval · axial · 5.0mm · 1.48mm/px · z∈[-114,+50]mm · 4 of 34 slices shown]
[im 1/34]
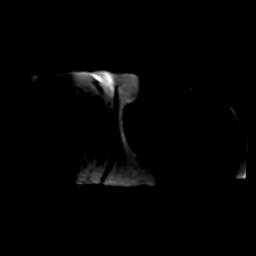
[im 12/34]
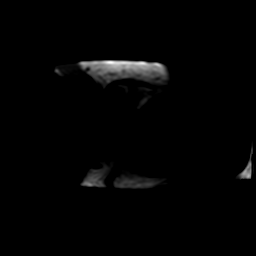
[im 23/34]
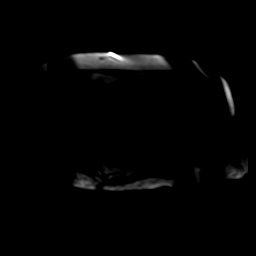
[im 34/34]
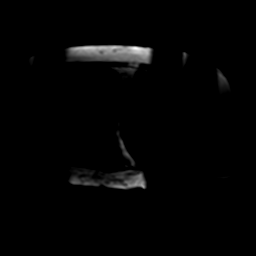

[Series 12: T2 · axial · 3.0mm · 0.70mm/px · z∈[-86,+57]mm · 6 of 49 slices shown (4 of 5)]
[im 1/49]
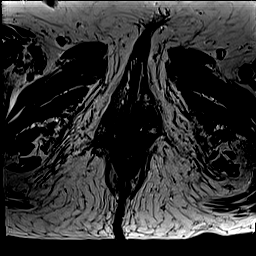
[im 10/49]
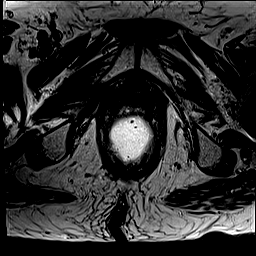
[im 20/49]
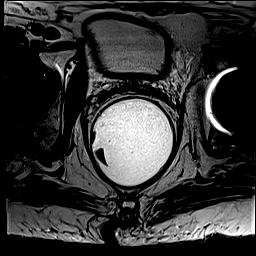
[im 29/49]
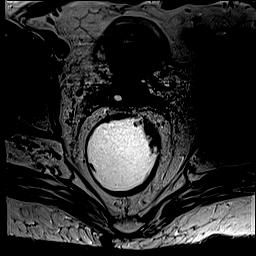
[im 39/49]
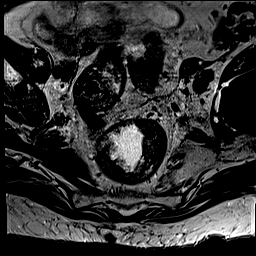
[im 49/49]
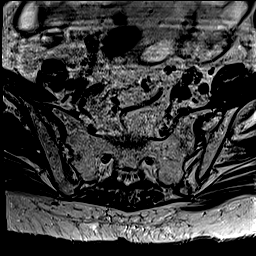

[Series 13: T2 · coronal · 3.0mm · 0.70mm/px · 5 of 45 slices shown (5 of 5)]
[im 1/45]
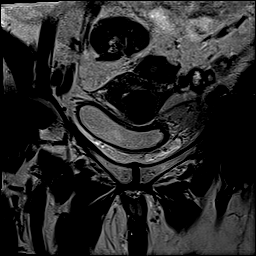
[im 12/45]
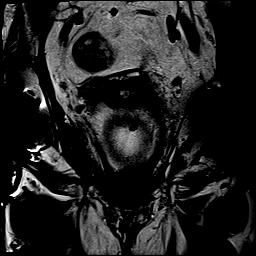
[im 23/45]
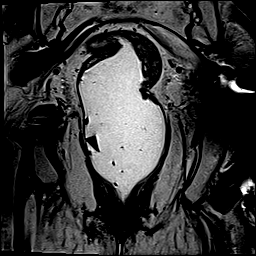
[im 34/45]
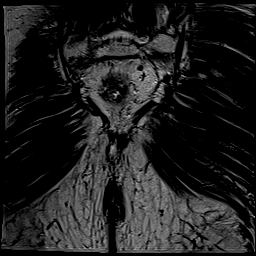
[im 45/45]
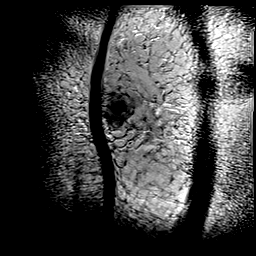

[48 of 48 positions shown; findings below may reference images not displayed]

FINDINGS: Urinary Tract:  Distal ureters and bladder normal.

Bowel: The rectum is distended by the rectal gel. In the vicinity of
the prior mucosal lesion there is no clear persistent lesion
present.

There is no lymph nodes in the perirectal fat. No external internal
iliac nodes noted.

No IV contrast was administered.

Vascular/Lymphatic: No pelvic lymph nodes.

Reproductive:  Leiomyoma in the anterior body years

Other:  None

Musculoskeletal: No aggressive osseous lesion
IMPRESSION: 1. No evidence of colorectal carcinoma recurrence in the vicinity of
the prior T3 lesion identified on MRI [DATE].
[DATE]. No evidence of metastatic adenopathy in the perirectal fat.
3. No evidence of iliac lymphadenopathy.
4. LEFT hip prosthetic artifact noted on the diffusion-weighted
imaging.

## 2021-05-29 MED ORDER — IOHEXOL 300 MG/ML  SOLN
100.0000 mL | Freq: Once | INTRAMUSCULAR | Status: AC | PRN
  Administered 2021-05-29: 100 mL via INTRAVENOUS

## 2021-05-29 MED ORDER — IOHEXOL 300 MG/ML  SOLN
75.0000 mL | Freq: Once | INTRAMUSCULAR | Status: AC | PRN
  Administered 2021-05-29: 75 mL via INTRAVENOUS

## 2021-06-01 ENCOUNTER — Ambulatory Visit (HOSPITAL_COMMUNITY): Payer: Medicare Other | Admitting: Hematology

## 2021-06-02 ENCOUNTER — Inpatient Hospital Stay (HOSPITAL_BASED_OUTPATIENT_CLINIC_OR_DEPARTMENT_OTHER): Payer: Medicare Other | Admitting: Hematology and Oncology

## 2021-06-02 ENCOUNTER — Other Ambulatory Visit: Payer: Self-pay

## 2021-06-02 ENCOUNTER — Encounter (HOSPITAL_COMMUNITY): Payer: Self-pay | Admitting: Hematology

## 2021-06-02 VITALS — BP 123/86 | HR 67 | Temp 97.6°F | Resp 18 | Wt 150.1 lb

## 2021-06-02 DIAGNOSIS — Z95828 Presence of other vascular implants and grafts: Secondary | ICD-10-CM

## 2021-06-02 DIAGNOSIS — C20 Malignant neoplasm of rectum: Secondary | ICD-10-CM | POA: Diagnosis not present

## 2021-06-02 DIAGNOSIS — Z85048 Personal history of other malignant neoplasm of rectum, rectosigmoid junction, and anus: Secondary | ICD-10-CM | POA: Diagnosis not present

## 2021-06-02 NOTE — Progress Notes (Signed)
[place], [place] [address]   CLINIC:  Medical Oncology/Hematology  PCP:  Roderic Scarce, MD [address] [name] / [place] [address] [phone]   REASON FOR VISIT:  Follow-up for rectal cancer  PRIOR THERAPY:  1. Transanal resection of polyp on 10/2012. 2. FOLFOX and Aloxi x 7 cycles from 09/16/2020 to 12/31/2020. 3. XRT with Xeloda BID (1,000 mg & 500 mg) from 03/18/2021 to 05/01/2021.  NGS Results: Not done  CURRENT THERAPY: awaiting surgical re-evaluation  BRIEF ONCOLOGIC HISTORY:  Oncology History  Rectal cancer ([place])  08/14/2020 Initial Diagnosis   Rectal cancer ([place])   09/02/2020 Cancer Staging   Staging form: Colon and Rectum, AJCC 8th Edition - Clinical stage from 09/02/2020: Stage IIA (cT3, cN0, cM0) - Signed by [name], MD on 09/02/2020   09/16/2020 -  Chemotherapy   The patient had palonosetron (ALOXI) injection 0.25 mg, 0.25 mg, Intravenous,  Once, 7 of 7 cycles Administration: 0.25 mg (09/16/2020), 0.25 mg (10/07/2020), 0.25 mg (10/21/2020), 0.25 mg (11/04/2020), 0.25 mg (11/26/2020), 0.25 mg (12/10/2020), 0.25 mg (12/31/2020) pegfilgrastim-cbqv (UDENYCA) injection 6 mg, 6 mg, Subcutaneous, Once, 6 of 6 cycles Administration: 6 mg (10/09/2020), 6 mg (10/23/2020), 6 mg (11/06/2020), 6 mg (01/01/2021) leucovorin 700 mg in dextrose 5 % 250 mL infusion, 380 mg/m2 = 736 mg, Intravenous,  Once, 7 of 7 cycles Dose modification: 300 mg/m2 (original dose 400 mg/m2, Cycle 6, Reason: Provider Judgment) Administration: 700 mg (09/16/2020), 700 mg (10/07/2020), 700 mg (10/21/2020), 700 mg (11/04/2020), 700 mg (11/26/2020), 552 mg (12/10/2020), 552 mg (12/31/2020) oxaliplatin (ELOXATIN) 125 mg in dextrose 5 % 500 mL chemo infusion, 68 mg/m2 = 125 mg (80 % of original dose 85 mg/m2), Intravenous,  Once, 7 of 7 cycles Dose modification: 68 mg/m2 (80 % of original dose 85 mg/m2, Cycle 1, Reason: Provider Judgment), 68 mg/m2 (80 % of original  dose 85 mg/m2, Cycle 2, Reason: Provider Judgment), 55 mg/m2 (original dose 85 mg/m2, Cycle 4, Reason: Other (see comments), Comment: neuropathy) Administration: 125 mg (09/16/2020), 125 mg (10/07/2020), 125 mg (10/21/2020), 100 mg (11/04/2020), 100 mg (11/26/2020), 100 mg (12/10/2020), 100 mg (12/31/2020) fluorouracil (ADRUCIL) chemo injection 750 mg, 400 mg/m2 = 750 mg, Intravenous,  Once, 7 of 7 cycles Dose modification: 300 mg/m2 (original dose 400 mg/m2, Cycle 6, Reason: Provider Judgment) Administration: 750 mg (09/16/2020), 750 mg (10/07/2020), 750 mg (10/21/2020), 750 mg (11/04/2020), 750 mg (11/26/2020), 550 mg (12/10/2020), 550 mg (12/31/2020) fluorouracil (ADRUCIL) 4,400 mg in sodium chloride 0.9 % 62 mL chemo infusion, 2,400 mg/m2 = 4,400 mg, Intravenous, 1 Day/Dose, 7 of 7 cycles Dose modification: 1,920 mg/m2 (80 % of original dose 2,400 mg/m2, Cycle 5, Reason: Other (see comments), Comment: diarrhea), 1,800 mg/m2 (original dose 2,400 mg/m2, Cycle 6, Reason: Provider Judgment) Administration: 4,400 mg (09/16/2020), 4,400 mg (10/07/2020), 4,400 mg (10/21/2020), 4,400 mg (11/04/2020), 3,550 mg (11/26/2020), 3,300 mg (12/10/2020), 3,300 mg (12/31/2020)  for chemotherapy treatment.      CANCER STAGING: Cancer Staging Rectal cancer [facility]) Staging form: Colon and Rectum, AJCC 8th Edition - Clinical stage from 09/02/2020: Stage IIA (cT3, cN0, cM0) - Signed by [name], MD on 09/02/2020   INTERVAL HISTORY:  Brenda Mclean, a 75 y.o. female, returns for routine follow-up of her rectal cancer. Brenda Mclean was last seen on 04/30/2021.   On exam today Brenda Mclean reports that she has been well in the interim since last visit.  She does have some diarrhea daily, but this  is markedly improved from when she was taking the Xeloda pills.  She notes that her appetite has been getting better and she has not been seeing any dark stools or blood in the stool.  She currently denies any fevers, chills,  sweats, nausea, vomiting or diarrhea.  She is quite happy to hear the results of the CT scan and MRI.  We will plan to refer the patient to surgery for consideration of resection status post neoadjuvant therapy.   REVIEW OF SYSTEMS:  Review of Systems  Constitutional: Positive for appetite change (25%) and fatigue (75%).  HENT:   Negative for mouth sores.   Gastrointestinal: Positive for diarrhea (on Imodium). Negative for abdominal pain, nausea and vomiting.  Skin: Negative for rash.  Neurological: Negative for numbness.  All other systems reviewed and are negative.   PAST MEDICAL/SURGICAL HISTORY:  Past Medical History:  Diagnosis Date  . Arthritis   . COVID-19 03/2019  . GERD (gastroesophageal reflux disease)   . HLD (hyperlipidemia)   . HTN (hypertension)   . Port-A-Cath in place 09/09/2020  . 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 Sigmoid diverticulitis 0867   Uncomplicated   Past Surgical History:  Procedure Laterality Date  . BIOPSY  07/10/2020   Procedure: BIOPSY;  Surgeon: [name], MD;  [facility]: [facility];  Service: Endoscopy;;  . CHOLECYSTECTOMY  1998  . COLON SURGERY  10/30/2012   Dr. Audrie Lia; transanal resection of 2 cm rectal polyp; pathology revealed superficially invasive moderately differentiated adenocarcinoma arising in adenomatous polyp of distal rectum.  . COLONOSCOPY  10/24/2013   Dr. West Carbo; with propofol; small granuloma and sutures distal rectum, small polyp mid rectum removed, sigmoid diverticulosis, otherwise normal exam.  No pathology received.  Recommended colonoscopy in 5 years.  . COLONOSCOPY  09/08/2012   Dr. [place] Carbo; with propofol; multi lobulated friable 3 cm mass distal rectum s/p multiple biopsies, scattered sigmoid diverticulosis, otherwise normal exam.  Pathology with adenomatous polyp.  . COLONOSCOPY WITH PROPOFOL N/A 07/10/2020   Procedure: COLONOSCOPY WITH PROPOFOL;  Surgeon: [name], MD;  Location: [facility];   Service: Endoscopy;  Laterality: N/A;  10:00am  . POLYPECTOMY  07/10/2020   Procedure: POLYPECTOMY;  Surgeon: [name], MD;  Location: [facility];  Service: Endoscopy;;  . PORTACATH PLACEMENT Left 09/09/2020   Procedure: INSERTION PORT-A-CATH;  Surgeon: Aviva Signs, MD;  Location: [facility];  Service: General;  Laterality: Left;  . REPLACEMENT TOTAL HIP W/  RESURFACING IMPLANTS Bilateral   . 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 REPLACEMENT TOTAL KNEE Right 12/03/2014  . TOTAL SHOULDER ARTHROPLASTY  04/26/2018    SOCIAL HISTORY:  Social History   Socioeconomic History  . Marital status: Married    Spouse name: Not on file  . Number of children: 2  . Years of education: Not on file  . Highest education level: Not on file  Occupational History    Employer: retired  Tobacco Use  . Smoking status: Never Smoker  . Smokeless tobacco: Never Used  Vaping Use  . Vaping Use: Never used  Substance and Sexual Activity  . Alcohol use: Never  . Drug use: Never  . Sexual activity: Not Currently  Other Topics Concern  . Not on file  Social History Narrative  . Not on file   Social Determinants of Health   Financial Resource Strain: Low Risk   . Difficulty of Paying Living Expenses: Not hard at all  Food Insecurity: No Food Insecurity  .  Worried About Charity fundraiser in the Last Year: Never true  . Ran Out of Food in the Last Year: Never true  Transportation Needs: No Transportation Needs  . Lack of Transportation (Medical): No  . Lack of Transportation (Non-Medical): No  Physical Activity: Inactive  . Days of Exercise per Week: 0 days  . Minutes of Exercise per Session: 0 min  Stress: No Stress Concern Present  . Feeling of Stress : Only a little  Social Connections: Moderately Integrated  . Frequency of Communication with Friends and Family: More than three times a week  . Frequency of Social Gatherings with Friends and Family: More than three times a week  .  Attends Religious Services: More than 4 times per year  . Active Member of Clubs or Organizations: No  . Attends Archivist Meetings: Never  . Marital Status: Married  Human resources officer Violence: Not At Risk  . Fear of Current or Ex-Partner: No  . Emotionally Abused: No  . Physically Abused: No  . Sexually Abused: No    FAMILY HISTORY:  Family History  Problem Relation Age of Onset  . Arthritis Mother   . High blood pressure Mother   . Heart disease Father   . Breast cancer Maternal Aunt   . Stroke Maternal Grandmother   . Cancer Paternal Grandmother   . Diabetes Brother   . Colon cancer Neg Hx     CURRENT MEDICATIONS:  Current Outpatient Medications  Medication Sig Dispense Refill  . amoxicillin (AMOXIL) 500 MG capsule SMARTSIG:4 Capsule(s) By Mouth    . capecitabine (XELODA) 500 MG tablet Take 2 tablets (1,000 mg total) by mouth 2 (two) times daily after a meal. Take Monday- Friday. Take only on days of radiation. 120 tablet 0  . capecitabine (XELODA) 500 MG tablet TAKE 2 TABLETS (1,000 MG TOTAL) BY MOUTH 2 (TWO) TIMES DAILY AFTER A MEAL. [place]. TAKE ONLY ON DAYS OF RADIATION. 120 tablet 0  . celecoxib (CELEBREX) 200 MG capsule Take 200 mg by mouth daily.    . ciprofloxacin (CIPRO) 500 MG tablet Take 1 tablet (500 mg total) by mouth 2 (two) times daily. 14 tablet 0  . dexamethasone 10 mg in sodium chloride 0.9 % 50 mL Inject 10 mg into the vein every 14 (fourteen) days. Prior to chemotherapy administration    . feeding supplement (ENSURE ENLIVE / ENSURE PLUS) LIQD Take 237 mLs by mouth 2 (two) times daily between meals. 237 mL 12  . lidocaine-prilocaine (EMLA) cream Apply a small amount to port a cath site and cover with plastic wrap 1 hour prior to chemotherapy appointments 30 g 3  . omega-3 acid ethyl esters (LOVAZA) 1 g capsule Take 2 g by mouth daily.    Marland Kitchen omeprazole (PRILOSEC) 20 MG capsule Take 20 mg by mouth daily.    . potassium chloride SA  (KLOR-CON) 20 MEQ tablet Take 1 tablet (20 mEq total) by mouth daily. 30 tablet 2  . prochlorperazine (COMPAZINE) 10 MG tablet Take 1 tablet (10 mg total) by mouth 2 (two) times daily. Take 30 minutes prior to Xeloda 60 tablet 1  . simvastatin (ZOCOR) 40 MG tablet Take 40 mg by mouth at bedtime.     . SSD 1 % cream Apply topically 3 (three) times daily.    . traMADol (ULTRAM) 50 MG tablet Take 1 tablet (50 mg total) by mouth every 6 (six) hours as needed. 20 tablet 0  . Turmeric 500 MG TABS  Take 500 mg by mouth daily.    . valsartan (DIOVAN) 320 MG tablet Take 320 mg by mouth daily.     No current facility-administered medications for this visit.   Facility-Administered Medications Ordered in Other Visits  Medication Dose Route Frequency Provider Last Rate Last Admin  . heparin lock flush 100 unit/mL  500 Units Intracatheter Once PRN [name], MD      . sodium chloride flush (NS) 0.9 % injection 10 mL  10 mL Intracatheter PRN [name], MD   10 mL at 11/26/20 0950    ALLERGIES:  No Known Allergies  PHYSICAL EXAM:  Performance status (ECOG): 0 - Asymptomatic  Vitals:   06/02/21 1146  BP: 123/86  Pulse: 67  Resp: 18  Temp: 97.6 F (36.4 C)  SpO2: 97%   Wt Readings from Last 3 Encounters:  06/02/21 150 lb 1.6 oz (68.1 kg)  04/30/21 152 lb (68.9 kg)  04/22/21 155 lb 11.2 oz (70.6 kg)   Physical Exam Vitals reviewed.  Constitutional:      Appearance: Normal appearance.  Cardiovascular:     Rate and Rhythm: Normal rate and regular rhythm.     Pulses: Normal pulses.     Heart sounds: Normal heart sounds.  Pulmonary:     Effort: Pulmonary effort is normal.     Breath sounds: Normal breath sounds.  Musculoskeletal:     Right lower leg: No edema.     Left lower leg: No edema.  Neurological:     General: No focal deficit present.     Mental Status: She is alert and oriented to person, place, and time.  Psychiatric:        Mood and Affect: Mood normal.         Behavior: Behavior normal.      LABORATORY DATA:  I have reviewed the labs as listed.  CBC Latest Ref Rng & Units 05/29/2021 04/30/2021 04/22/2021  WBC 4.0 - 10.5 K/uL 6.1 4.0 5.6  Hemoglobin 12.0 - 15.0 g/dL 12.6 12.9 12.9  Hematocrit 36.0 - 46.0 % 39.2 39.8 40.2  Platelets 150 - 400 K/uL 157 172 161   CMP Latest Ref Rng & Units 05/29/2021 04/30/2021 04/22/2021  Glucose 70 - 99 mg/dL 98 124(H) 97  BUN 8 - 23 mg/dL 15 14 14   Creatinine 0.44 - 1.00 mg/dL 0.82 0.83 0.79  Sodium 135 - 145 mmol/L 141 141 141  Potassium 3.5 - 5.1 mmol/L 3.2(L) 3.4(L) 3.9  Chloride 98 - 111 mmol/L 106 104 105  CO2 22 - 32 mmol/L 27 29 27   Calcium 8.9 - 10.3 mg/dL 9.2 9.5 9.4  Total Protein 6.5 - 8.1 g/dL 6.8 6.9 6.8  Total Bilirubin 0.3 - 1.2 mg/dL 0.7 0.7 0.6  Alkaline Phos 38 - 126 U/L 74 80 85  AST 15 - 41 U/L 21 24 37  ALT 0 - 44 U/L 17 23 38    DIAGNOSTIC IMAGING:  I have independently reviewed the scans and discussed with the patient. CT Abdomen W Contrast  Result Date: 05/30/2021 CLINICAL DATA:  Rectal cancer restaging, ongoing chemotherapy EXAM: CT ABDOMEN WITH CONTRAST TECHNIQUE: Multidetector CT imaging of the abdomen was performed using the standard protocol following bolus administration of intravenous contrast. CONTRAST:  58mL OMNIPAQUE IOHEXOL 300 MG/ML  SOLN COMPARISON:  CT abdomen pelvis, 08/05/2020, MR abdomen, 09/11/2020 FINDINGS: Lower chest: No acute abnormality. Hepatobiliary: No focal liver abnormality is seen. Status post cholecystectomy. No biliary dilatation. Pancreas: Unremarkable. No pancreatic ductal dilatation or surrounding  inflammatory changes. Spleen: Normal in size without focal abnormality. Adrenals/Urinary Tract: There is an irregular calcification in the expected vicinity of the left adrenal gland, perhaps related to prior hemorrhage (series 2, image 15). The right adrenal gland is normal. Multiple small bilateral renal cysts. 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 Kidneys are otherwise normal, without renal calculi, focal lesion, or hydronephrosis. Bladder is unremarkable. Stomach/Bowel: Stomach is within normal limits. Appendix appears normal. No evidence of bowel wall thickening, distention, or inflammatory changes. Vascular/Lymphatic: Aortic atherosclerosis. No enlarged abdominal lymph nodes. Other: No abdominal wall hernia or abnormality. No abdominopelvic ascites. Musculoskeletal: No acute or significant osseous findings. IMPRESSION: 1. No evidence of metastatic disease in the abdomen. 2. An exophytic cyst of the lateral midportion of the left kidney previously characterized as a Bosniak III lesion is substantially diminished in size, consistent with a resolving hemorrhagic cyst. No further follow-up or characterization is required for this benign lesion. Aortic Atherosclerosis (ICD10-I70.0). Electronically Signed   By: Eddie Candle M.D.   On: 05/30/2021 12:59   MR PELVIS WO CONTRAST  Result Date: 05/30/2021 CLINICAL DATA:  Follow-up rectal carcinoma. EXAM: MRI PELVIS WITHOUT CONTRAST TECHNIQUE: Multiplanar multisequence MR imaging of the pelvis was performed. No intravenous contrast was administered. Rectal gel administered. Diffusion-weighted imaging degraded by hip prosthetic. COMPARISON:  MRI 08/27/2020 FINDINGS: Urinary Tract:  Distal ureters and bladder normal. Bowel: The rectum is distended by the rectal gel. In the vicinity of the prior mucosal lesion there is no clear persistent lesion present. There is no lymph nodes in the perirectal fat. No external internal iliac nodes noted. No IV contrast was administered. Vascular/Lymphatic: No pelvic lymph nodes. 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 No evidence of colorectal carcinoma recurrence in the vicinity of the prior T3 lesion identified on MRI 08/27/2020. 2. No evidence of metastatic adenopathy in the perirectal fat. 3. No evidence of iliac lymphadenopathy. 4. LEFT hip prosthetic artifact noted on the diffusion-weighted imaging. Electronically Signed   By: Suzy Bouchard M.D.   On: 05/30/2021 15:44     ASSESSMENT:  1. Rectal adenocarcinoma: -History of rectal polyp with adenocarcinoma, status post transanal resection of the polyp in November 2013. -Colonoscopy on July 10, 2020 shows 2.5 cm centrally depressed ulcerated lesion with heaped up circular margin, 8 cm from anal verge. Distally, just above the anal verge, suture line consistent with history of prior transanal polypectomy. -Pathology of the rectal mass consistent with adenocarcinoma. Splenic flexure polypectomy was tubular adenoma. Sigmoid colon biopsy was benign. -CT CAP with contrast on August 05, 2020 shows no evidence of neoplastic disease within the chest, abdomen or pelvis. Complicated left midpole renal cyst versus complex cystic mass. No lung masses. -Pelvis MRI on 08/27/2020 showed T3BN0 tumor. Distance of tumor/node from mesorectal fascia was 1 cm. Distance from tumor to the internal anal sphincter was 4.2 cm. Tumor size is 2.2 x 2.2 cm. -Total neoadjuvant therapy with 4 months of FOLFOX followed by long course chemo RT with Xeloda followed by restaging pelvis MRI 6 to 8 weeks after completion of XRT followed by resection recommended. -7 cycles of FOLFOX from 09/16/2020 through 12/31/2020, cycle 8 discontinued due to poor tolerance. -XRT with Xeloda  from 03/18/2021 through 05/01/2021.  2. Social/family history: -She lives at home with her husband. She worked as a Counselling psychologist for 47 years. No history of smoking. -Maternal aunt had breast cancer.  PLAN:  1.Stage II (T3N0) rectal  adenocarcinoma: -She is started back Xeloda 1000 mg in the morning and 500 mg in the evening on 04/27/2021 with radiation. - Her appetite is decreased.  She is drinking about 3 cans of Ensure daily.  Denied any nausea or vomiting. - Reviewed labs today which showed normal LFTs.  CBC was grossly normal. - patient completed Xeloda therapy with radiation.  - Pelvic MRI and CT of the abdomen on 05/29/2021 show no evidence of residual disease.  - We will refer her back to Dr. Marcello Moores for surgical re-evaluation.  --placeholder appointment in 2 months time.   2. Left kidney lesion: -Follow-up with Dr. Alyson Ingles with MRI in 6 months.  3. Hypokalemia:  -Continue potassium 20 mEq twice daily.  Potassium today is 3.2.  4. Diarrhea: -Continue Imodium 2 tablets at the first onset of diarrhea followed by 1 tablet after each watery bowel movement.  She is not requiring this as frequently.    Orders placed this encounter:  No orders of the defined types were placed in this encounter.  Ledell Peoples, MD Department of Hematology/Oncology [place] [facility] Phone: [phone] Pager: [phone] Email: [email or web address]

## 2021-06-08 ENCOUNTER — Encounter (HOSPITAL_COMMUNITY): Payer: Self-pay

## 2021-06-08 NOTE — Progress Notes (Signed)
Patient called with questions regarding inability to view 06/02/2021 visit information in [facility]. I attempted to call the patient back but was unable to reach the patient. I have left a detailed VM instructing the patient to call the [facility] for further clarification.

## 2021-08-04 ENCOUNTER — Ambulatory Visit (HOSPITAL_COMMUNITY): Payer: Medicare Other | Admitting: Hematology and Oncology

## 2021-08-04 ENCOUNTER — Other Ambulatory Visit (HOSPITAL_COMMUNITY): Payer: Medicare Other

## 2021-08-18 ENCOUNTER — Other Ambulatory Visit: Payer: Self-pay

## 2021-08-18 ENCOUNTER — Inpatient Hospital Stay (HOSPITAL_COMMUNITY): Payer: Medicare Other | Attending: Hematology

## 2021-08-18 ENCOUNTER — Other Ambulatory Visit: Payer: Self-pay | Admitting: Hematology and Oncology

## 2021-08-18 ENCOUNTER — Inpatient Hospital Stay (HOSPITAL_BASED_OUTPATIENT_CLINIC_OR_DEPARTMENT_OTHER): Payer: Medicare Other | Admitting: Hematology and Oncology

## 2021-08-18 VITALS — BP 148/63 | HR 60 | Temp 98.4°F | Resp 18 | Wt 151.2 lb

## 2021-08-18 DIAGNOSIS — Z95828 Presence of other vascular implants and grafts: Secondary | ICD-10-CM

## 2021-08-18 DIAGNOSIS — E876 Hypokalemia: Secondary | ICD-10-CM | POA: Insufficient documentation

## 2021-08-18 DIAGNOSIS — C20 Malignant neoplasm of rectum: Secondary | ICD-10-CM

## 2021-08-18 DIAGNOSIS — R197 Diarrhea, unspecified: Secondary | ICD-10-CM | POA: Insufficient documentation

## 2021-08-18 DIAGNOSIS — Z85048 Personal history of other malignant neoplasm of rectum, rectosigmoid junction, and anus: Secondary | ICD-10-CM | POA: Diagnosis present

## 2021-08-18 LAB — COMPREHENSIVE METABOLIC PANEL
ALT: 13 U/L (ref 0–44)
AST: 19 U/L (ref 15–41)
Albumin: 3.6 g/dL (ref 3.5–5.0)
Alkaline Phosphatase: 93 U/L (ref 38–126)
Anion gap: 7 (ref 5–15)
BUN: 14 mg/dL (ref 8–23)
CO2: 28 mmol/L (ref 22–32)
Calcium: 9 mg/dL (ref 8.9–10.3)
Chloride: 107 mmol/L (ref 98–111)
Creatinine, Ser: 0.77 mg/dL (ref 0.44–1.00)
GFR, Estimated: >60 mL/min (ref >60)
Glucose, Bld: 91 mg/dL (ref 70–99)
Potassium: 3.5 mmol/L (ref 3.5–5.1)
Sodium: 142 mmol/L (ref 135–145)
Total Bilirubin: 0.3 mg/dL (ref 0.3–1.2)
Total Protein: 7 g/dL (ref 6.5–8.1)

## 2021-08-18 LAB — CBC WITH DIFFERENTIAL/PLATELET
Abs Immature Granulocytes: 0.03 10*3/uL (ref 0.00–0.07)
Basophils Absolute: 0.1 10*3/uL (ref 0.0–0.1)
Basophils Relative: 1 %
Eosinophils Absolute: 0.1 10*3/uL (ref 0.0–0.5)
Eosinophils Relative: 2 %
HCT: 38.6 % (ref 36.0–46.0)
Hemoglobin: 12.6 g/dL (ref 12.0–15.0)
Immature Granulocytes: 1 %
Lymphocytes Relative: 17 %
Lymphs Abs: 0.9 10*3/uL (ref 0.7–4.0)
MCH: 32.2 pg (ref 26.0–34.0)
MCHC: 32.6 g/dL (ref 30.0–36.0)
MCV: 98.7 fL (ref 80.0–100.0)
Monocytes Absolute: 0.5 10*3/uL (ref 0.1–1.0)
Monocytes Relative: 9 %
Neutro Abs: 3.8 10*3/uL (ref 1.7–7.7)
Neutrophils Relative %: 70 %
Platelets: 256 10*3/uL (ref 150–400)
RBC: 3.91 MIL/uL (ref 3.87–5.11)
RDW: 13.2 % (ref 11.5–15.5)
WBC: 5.4 10*3/uL (ref 4.0–10.5)
nRBC: 0 % (ref 0.0–0.2)

## 2021-08-18 LAB — MAGNESIUM: Magnesium: 2.1 mg/dL (ref 1.7–2.4)

## 2021-08-18 NOTE — Progress Notes (Signed)
[place], [place] [address]   CLINIC:  Medical Oncology/Hematology  PCP:  Roderic Scarce, MD [address] [name] / [place] [address] [phone]   REASON FOR VISIT:  Follow-up for rectal cancer  PRIOR THERAPY:  1. Transanal resection of polyp on 10/2012. 2. FOLFOX and Aloxi x 7 cycles from 09/16/2020 to 12/31/2020. 3. XRT with Xeloda BID (1,000 mg & 500 mg) from 03/18/2021 to 05/01/2021.  NGS Results: Not done  CURRENT THERAPY: observation  BRIEF ONCOLOGIC HISTORY:  Oncology History  Rectal cancer ([place])  08/14/2020 Initial Diagnosis   Rectal cancer ([place])   09/02/2020 Cancer Staging   Staging form: Colon and Rectum, AJCC 8th Edition - Clinical stage from 09/02/2020: Stage IIA (cT3, cN0, cM0) - Signed by [name], MD on 09/02/2020   09/16/2020 -  Chemotherapy   The patient had palonosetron (ALOXI) injection 0.25 mg, 0.25 mg, Intravenous,  Once, 7 of 7 cycles Administration: 0.25 mg (09/16/2020), 0.25 mg (10/07/2020), 0.25 mg (10/21/2020), 0.25 mg (11/04/2020), 0.25 mg (11/26/2020), 0.25 mg (12/10/2020), 0.25 mg (12/31/2020) pegfilgrastim-cbqv (UDENYCA) injection 6 mg, 6 mg, Subcutaneous, Once, 6 of 6 cycles Administration: 6 mg (10/09/2020), 6 mg (10/23/2020), 6 mg (11/06/2020), 6 mg (01/01/2021) leucovorin 700 mg in dextrose 5 % 250 mL infusion, 380 mg/m2 = 736 mg, Intravenous,  Once, 7 of 7 cycles Dose modification: 300 mg/m2 (original dose 400 mg/m2, Cycle 6, Reason: Provider Judgment) Administration: 700 mg (09/16/2020), 700 mg (10/07/2020), 700 mg (10/21/2020), 700 mg (11/04/2020), 700 mg (11/26/2020), 552 mg (12/10/2020), 552 mg (12/31/2020) oxaliplatin (ELOXATIN) 125 mg in dextrose 5 % 500 mL chemo infusion, 68 mg/m2 = 125 mg (80 % of original dose 85 mg/m2), Intravenous,  Once, 7 of 7 cycles Dose modification: 68 mg/m2 (80 % of original dose 85 mg/m2, Cycle 1, Reason: Provider Judgment), 68 mg/m2 (80 % of original dose 85 mg/m2, Cycle  2, Reason: Provider Judgment), 55 mg/m2 (original dose 85 mg/m2, Cycle 4, Reason: Other (see comments), Comment: neuropathy) Administration: 125 mg (09/16/2020), 125 mg (10/07/2020), 125 mg (10/21/2020), 100 mg (11/04/2020), 100 mg (11/26/2020), 100 mg (12/10/2020), 100 mg (12/31/2020) fluorouracil (ADRUCIL) chemo injection 750 mg, 400 mg/m2 = 750 mg, Intravenous,  Once, 7 of 7 cycles Dose modification: 300 mg/m2 (original dose 400 mg/m2, Cycle 6, Reason: Provider Judgment) Administration: 750 mg (09/16/2020), 750 mg (10/07/2020), 750 mg (10/21/2020), 750 mg (11/04/2020), 750 mg (11/26/2020), 550 mg (12/10/2020), 550 mg (12/31/2020) fluorouracil (ADRUCIL) 4,400 mg in sodium chloride 0.9 % 62 mL chemo infusion, 2,400 mg/m2 = 4,400 mg, Intravenous, 1 Day/Dose, 7 of 7 cycles Dose modification: 1,920 mg/m2 (80 % of original dose 2,400 mg/m2, Cycle 5, Reason: Other (see comments), Comment: diarrhea), 1,800 mg/m2 (original dose 2,400 mg/m2, Cycle 6, Reason: Provider Judgment) Administration: 4,400 mg (09/16/2020), 4,400 mg (10/07/2020), 4,400 mg (10/21/2020), 4,400 mg (11/04/2020), 3,550 mg (11/26/2020), 3,300 mg (12/10/2020), 3,300 mg (12/31/2020)   for chemotherapy treatment.       CANCER STAGING: Cancer Staging Rectal cancer [facility]) Staging form: Colon and Rectum, AJCC 8th Edition - Clinical stage from 09/02/2020: Stage IIA (cT3, cN0, cM0) - Signed by [name], MD on 09/02/2020   INTERVAL HISTORY:  Ms. JALAYIAH EINSPAHR, a 75 y.o. female, returns for routine follow-up of her rectal cancer. [place] was last seen on 06/02/2021.   On exam today Mrs. Krell reports she does still have some residual diarrhea but it is pretty improved from prior.  She notes that her appetite  is been quite good and her weight has been steady.  She notes that she would like to have her port removed if feasible.  She notes that she is in agreement with Dr. Marcello Moores about weight and watch management for her rectal cancer.  She  currently denies any fevers, chills, sweats, nausea, vomiting or diarrhea.  A full 10 point ROS is listed below.  REVIEW OF SYSTEMS:  Review of Systems  Constitutional:  Positive for appetite change (25%) and fatigue (75%).  HENT:   Negative for mouth sores.   Gastrointestinal:  Positive for diarrhea (on Imodium). Negative for abdominal pain, nausea and vomiting.  Skin:  Negative for rash.  Neurological:  Negative for numbness.  All other systems reviewed and are negative.  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 Audrie Lia; transanal resection of 2 cm rectal polyp; pathology revealed superficially invasive moderately differentiated adenocarcinoma arising in adenomatous polyp of distal rectum.   COLONOSCOPY  10/24/2013   Dr. West Carbo; with propofol; small granuloma and sutures distal rectum, small polyp mid rectum removed, sigmoid diverticulosis, otherwise normal exam.  No pathology received.  Recommended colonoscopy in 5 years.   COLONOSCOPY  09/08/2012   Dr. West Carbo; with propofol; multi lobulated friable 3 cm mass distal rectum s/p multiple biopsies, scattered sigmoid diverticulosis, otherwise normal exam.  Pathology with adenomatous polyp.   COLONOSCOPY WITH PROPOFOL N/A 07/10/2020   Procedure: COLONOSCOPY WITH PROPOFOL;  Surgeon: [name], MD;  [facility];  Service: Endoscopy;  Laterality: N/A;  10:00am   POLYPECTOMY  07/10/2020   Procedure: POLYPECTOMY;  Surgeon: [name], MD;  [facility];  Service: Endoscopy;;   PORTACATH PLACEMENT Left 09/09/2020   Procedure: INSERTION PORT-A-CATH;  Surgeon: Aviva Signs, MD;  Location: [facility] OR[facility];  Service: General;  Laterality: Left;   REPLACEMENT TOTAL HIP W/  RESURFACING IMPLANTS Bilateral    REPLACEMENT TOTAL KNEE Left 07/04/2013   REPLACEMENT TOTAL KNEE Right 12/03/2014   TOTAL SHOULDER ARTHROPLASTY  04/26/2018    SOCIAL HISTORY:  Social History   Socioeconomic History   Marital status: Married    Spouse name: Not on file   Number of children: 2   Years of education: Not on file   Highest education level: Not on file  Occupational History    Employer: retired  Tobacco Use   Smoking status: Never   Smokeless tobacco: Never  Vaping Use   Vaping Use: Never used  Substance and Sexual Activity   Alcohol use: Never   Drug use: Never   Sexual activity: Not Currently  Other Topics Concern   Not on file  Social History Narrative   Not on file   Social Determinants of Health   Financial Resource Strain: Low Risk    Difficulty of Paying Living Expenses: Not hard at all  Food Insecurity: No Food Insecurity   Worried About [place] in the Last Year: Never true  Ran Out of Food in the Last Year: Never true  Transportation Needs: No Transportation Needs   Lack of Transportation (Medical): No   Lack of Transportation (Non-Medical): No  Physical Activity: Inactive   Days of Exercise per Week: 0 days   Minutes of Exercise per Session: 0 min  Stress: No Stress Concern Present   Feeling of Stress : Only a little  Social Connections: Moderately Integrated   Frequency of Communication with Friends and Family: More than three times a week   Frequency of Social Gatherings with Friends and Family: More than three times a week   Attends Religious Services: More than 4 times per year   Active Member of [employer] or Organizations: No   Attends Music therapist: Never   Marital  Status: Married  Human resources officer Violence: Not At Risk   Fear of Current or Ex-Partner: No   Emotionally Abused: No   Physically Abused: No   Sexually Abused: No    FAMILY HISTORY:  Family History  Problem Relation Age of Onset   Arthritis Mother    High blood pressure Mother    Heart disease Father    Breast cancer Maternal Aunt    Stroke Maternal Grandmother    Cancer Paternal Grandmother    Diabetes Brother    Colon cancer Neg Hx     CURRENT MEDICATIONS:  Current Outpatient Medications  Medication Sig Dispense Refill   capecitabine (XELODA) 500 MG tablet Take 2 tablets (1,000 mg total) by mouth 2 (two) times daily after a meal. Take Monday- Friday. Take only on days of radiation. 120 tablet 0   capecitabine (XELODA) 500 MG tablet TAKE 2 TABLETS (1,000 MG TOTAL) BY MOUTH 2 (TWO) TIMES DAILY AFTER A MEAL. [place]. TAKE ONLY ON DAYS OF RADIATION. 120 tablet 0   ciprofloxacin (CIPRO) 500 MG tablet Take 1 tablet (500 mg total) by mouth 2 (two) times daily. 14 tablet 0   dexamethasone 10 mg in sodium chloride 0.9 % 50 mL Inject 10 mg into the vein every 14 (fourteen) days. Prior to chemotherapy administration     feeding supplement (ENSURE ENLIVE / ENSURE PLUS) LIQD Take 237 mLs by mouth 2 (two) times daily between meals. 237 mL 12   lidocaine-prilocaine (EMLA) cream Apply a small amount to port a cath site and cover with plastic wrap 1 hour prior to chemotherapy appointments 30 g 3   omega-3 acid ethyl esters (LOVAZA) 1 g capsule Take 2 g by mouth daily.     omeprazole (PRILOSEC) 20 MG capsule Take 20 mg by mouth daily.     potassium chloride SA (KLOR-CON) 20 MEQ tablet Take 1 tablet (20 mEq total) by mouth daily. 30 tablet 2   prochlorperazine (COMPAZINE) 10 MG tablet Take 1 tablet (10 mg total) by mouth 2 (two) times daily. Take 30 minutes prior to Xeloda 60 tablet 1   simvastatin (ZOCOR) 40 MG tablet Take 40 mg by mouth at bedtime.      SSD 1 % cream Apply topically 3  (three) times daily.     Turmeric 500 MG TABS Take 500 mg by mouth daily.     valsartan (DIOVAN) 320 MG tablet Take 320 mg by mouth daily.     traMADol (ULTRAM) 50 MG tablet Take 1 tablet (50 mg total) by mouth every 6 (six) hours as needed. (Patient not taking: Reported on 08/18/2021) 20 tablet 0   No current facility-administered medications for this visit.   Facility-Administered Medications  Ordered in Other Visits  Medication Dose Route Frequency Provider Last Rate Last Admin   heparin lock flush 100 unit/mL  500 Units Intracatheter Once PRN [name], MD       sodium chloride flush (NS) 0.9 % injection 10 mL  10 mL Intracatheter PRN [name], MD   10 mL at 11/26/20 0950    ALLERGIES:  No Known Allergies  PHYSICAL EXAM:  Performance status (ECOG): 0 - Asymptomatic  Vitals:   08/18/21 1119  BP: (!) 148/63  Pulse: 60  Resp: 18  Temp: 98.4 F (36.9 C)  SpO2: 99%   Wt Readings from Last 3 Encounters:  08/18/21 151 lb 3.8 oz (68.6 kg)  06/02/21 150 lb 1.6 oz (68.1 kg)  04/30/21 152 lb (68.9 kg)   Physical Exam Vitals reviewed.  Constitutional:      Appearance: Normal appearance.  Cardiovascular:     Rate and Rhythm: Normal rate and regular rhythm.     Pulses: Normal pulses.     Heart sounds: Normal heart sounds.  Pulmonary:     Effort: Pulmonary effort is normal.     Breath sounds: Normal breath sounds.  Musculoskeletal:     Right lower leg: No edema.     Left lower leg: No edema.  Neurological:     General: No focal deficit present.     Mental Status: She is alert and oriented to person, place, and time.  Psychiatric:        Mood and Affect: Mood normal.        Behavior: Behavior normal.     LABORATORY DATA:  I have reviewed the labs as listed.  CBC Latest Ref Rng & Units 08/18/2021 05/29/2021 04/30/2021  WBC 4.0 - 10.5 K/uL 5.4 6.1 4.0  Hemoglobin 12.0 - 15.0 g/dL 12.6 12.6 12.9  Hematocrit 36.0 - 46.0 % 38.6 39.2 39.8  Platelets 150 -  400 K/uL 256 157 172   CMP Latest Ref Rng & Units 08/18/2021 05/29/2021 04/30/2021  Glucose 70 - 99 mg/dL 91 98 124(H)  BUN 8 - 23 mg/dL '14 15 14  '$ Creatinine 0.44 - 1.00 mg/dL 0.77 0.82 0.83  Sodium 135 - 145 mmol/L 142 141 141  Potassium 3.5 - 5.1 mmol/L 3.5 3.2(L) 3.4(L)  Chloride 98 - 111 mmol/L 107 106 104  CO2 22 - 32 mmol/L '28 27 29  '$ Calcium 8.9 - 10.3 mg/dL 9.0 9.2 9.5  Total Protein 6.5 - 8.1 g/dL 7.0 6.8 6.9  Total Bilirubin 0.3 - 1.2 mg/dL 0.3 0.7 0.7  Alkaline Phos 38 - 126 U/L 93 74 80  AST 15 - 41 U/L '19 21 24  '$ ALT 0 - 44 U/L '13 17 23    '$ DIAGNOSTIC IMAGING:  I have independently reviewed the scans and discussed with the patient. No results found.    ASSESSMENT:  1.  Rectal adenocarcinoma: -History of rectal polyp with adenocarcinoma, status post transanal resection of the polyp in November 2013. -Colonoscopy on July 10, 2020 shows 2.5 cm centrally depressed ulcerated lesion with heaped up circular margin, 8 cm from anal verge.  Distally, just above the anal verge, suture line consistent with history of prior transanal polypectomy. -Pathology of the rectal mass consistent with adenocarcinoma.  Splenic flexure polypectomy was tubular adenoma.  Sigmoid colon biopsy was benign. -CT CAP with contrast on August 05, 2020 shows no evidence of neoplastic disease within the chest, abdomen or pelvis.  Complicated left midpole renal cyst versus complex cystic mass.  No lung masses. -  Pelvis MRI on 08/27/2020 showed T3BN0 tumor.  Distance of tumor/node from mesorectal fascia was 1 cm.  Distance from tumor to the internal anal sphincter was 4.2 cm.  Tumor size is 2.2 x 2.2 cm. -Total neoadjuvant therapy with 4 months of FOLFOX followed by long course chemo RT with Xeloda followed by restaging pelvis MRI 6 to 8 weeks after completion of XRT followed by resection recommended. -7 cycles of FOLFOX from 09/16/2020 through 12/31/2020, cycle 8 discontinued due to poor tolerance. -XRT with Xeloda from  03/18/2021 through 05/01/2021.   2.  Social/family history: -She lives at home with her husband.  She worked as a Counselling psychologist for 47 years.  No history of smoking. -Maternal aunt had breast cancer.  PLAN:  1.  Stage II (T3N0) rectal adenocarcinoma: -She has completed chemotherapy and chemoradiation. She has discussed surgery with Dr. Marcello Moores and has opted for watch and wait.  - Reviewed labs today which showed normal LFTs.  CBC was grossly normal. - patient completed Xeloda therapy with radiation.  - Pelvic MRI and CT of the abdomen on 05/29/2021 show no evidence of residual disease.  - patient to be seen by Dr. Marcello Moores for repeat endoscopy and MRI.  --placeholder appointment in 3 months time.    2.  Left kidney lesion: - continue to monitor on subsequent imaging.    3.  Hypokalemia:  -Continue potassium 20 mEq twice daily.  Potassium today is 3.5.   4.  Diarrhea: -Continue Imodium 2 tablets at the first onset of diarrhea followed by 1 tablet after each watery bowel movement.  She is not requiring this as frequently.    Orders placed this encounter:  No orders of the defined types were placed in this encounter.  Ledell Peoples, MD Department of Hematology/Oncology [place] at [facility] Phone: [phone] Pager: [phone] Email: [email or web address]

## 2021-09-01 ENCOUNTER — Ambulatory Visit: Payer: Medicare Other | Admitting: General Surgery

## 2021-09-14 ENCOUNTER — Encounter (HOSPITAL_COMMUNITY): Payer: Self-pay

## 2021-09-14 NOTE — Progress Notes (Signed)
VM received from patient asking that proof that she is seen here at [facility] at [facility] be faxed to [place]. Upcoming appt schedule and last office note faxed. Attempted to return the patient's call but was unable to reach her.

## 2021-09-15 ENCOUNTER — Ambulatory Visit (INDEPENDENT_AMBULATORY_CARE_PROVIDER_SITE_OTHER): Payer: Medicare Other | Admitting: General Surgery

## 2021-09-15 ENCOUNTER — Other Ambulatory Visit: Payer: Self-pay

## 2021-09-15 ENCOUNTER — Encounter: Payer: Self-pay | Admitting: General Surgery

## 2021-09-15 VITALS — BP 139/84 | HR 72 | Temp 98.4°F | Resp 12 | Ht 60.0 in | Wt 152.0 lb

## 2021-09-15 DIAGNOSIS — C20 Malignant neoplasm of rectum: Secondary | ICD-10-CM

## 2021-09-15 NOTE — Progress Notes (Signed)
Brenda Mclean; [id number]; January 02, 1946   HPI Patient is a 75 year old white female who was referred to my care by Dr. Delton Coombes for Port-A-Cath removal.  She has finished with her chemotherapy for rectal cancer. 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 Audrie Lia; transanal resection of 2 cm rectal polyp; pathology revealed superficially invasive moderately differentiated adenocarcinoma arising in adenomatous polyp of distal rectum.   COLONOSCOPY  10/24/2013   Dr. West Carbo; with propofol; small granuloma and sutures distal rectum, small polyp mid rectum removed, sigmoid diverticulosis, otherwise normal exam.  No pathology received.  Recommended colonoscopy in 5 years.   COLONOSCOPY  09/08/2012   Dr. West Carbo; with propofol; multi lobulated friable 3 cm mass distal rectum s/p multiple biopsies, scattered sigmoid diverticulosis, otherwise normal exam.  Pathology with adenomatous polyp.   COLONOSCOPY WITH PROPOFOL N/A 07/10/2020   Procedure: COLONOSCOPY WITH PROPOFOL;  Surgeon: [name], MD;  [facility];  Service: Endoscopy;  Laterality: N/A;  10:00am   POLYPECTOMY  07/10/2020   Procedure: POLYPECTOMY;  Surgeon: [name], MD;  [facility];  Service: Endoscopy;;   PORTACATH PLACEMENT Left 09/09/2020   Procedure: INSERTION PORT-A-CATH;  Surgeon: Aviva Signs, MD;  Location: [facility];  Service: General;  Laterality: Left;   REPLACEMENT TOTAL HIP W/  RESURFACING IMPLANTS Bilateral    REPLACEMENT  TOTAL KNEE Left 07/04/2013   REPLACEMENT TOTAL KNEE Right 12/03/2014   TOTAL SHOULDER ARTHROPLASTY  04/26/2018    Family History  Problem Relation Age of Onset   Arthritis Mother    High blood pressure Mother    Heart disease Father    Breast cancer Maternal Aunt    Stroke Maternal Grandmother    Cancer Paternal Grandmother    Diabetes Brother    Colon cancer Neg Hx     Current Outpatient Medications on File Prior to Visit  Medication Sig Dispense Refill   MULTIPLE VITAMIN PO Take by mouth.     Omega-3 Fatty Acids (FISH OIL) 1200 MG CAPS Take by mouth.     capecitabine (XELODA) 500 MG tablet Take 2 tablets (1,000 mg total) by mouth 2 (two) times daily after a meal. Take Monday- Friday. Take only on days of radiation. (Patient not taking: Reported on 09/15/2021) 120 tablet 0   capecitabine (XELODA) 500 MG tablet TAKE 2 TABLETS (1,000 MG TOTAL) BY MOUTH 2 (TWO) TIMES DAILY AFTER A MEAL. [place]. TAKE ONLY ON DAYS OF RADIATION. (Patient not taking: Reported on 09/15/2021) 120 tablet 0   Current Facility-Administered Medications on File Prior to Visit  Medication Dose Route Frequency Provider Last Rate Last Admin   heparin lock flush 100 unit/mL  500 Units Intracatheter Once PRN [name], MD       sodium chloride flush (NS) 0.9 % injection 10 mL  10 mL Intracatheter PRN [name], MD   10 mL at 11/26/20 0950    No Known Allergies  Social History   Substance and  Sexual Activity  Alcohol Use Never    Social History   Tobacco Use  Smoking Status Never  Smokeless Tobacco Never    Review of Systems  Constitutional: Negative.   HENT: Negative.    Eyes: Negative.   Respiratory: Negative.    Cardiovascular: Negative.   Gastrointestinal: Negative.   Genitourinary: Negative.   Musculoskeletal: Negative.   Skin: Negative.   Neurological: Negative.   Endo/Heme/Allergies: Negative.   Psychiatric/Behavioral: Negative.     Objective   Vitals:    09/15/21 1305  BP: 139/84  Pulse: 72  Resp: 12  Temp: 98.4 F (36.9 C)  SpO2: 96%    Physical Exam Vitals reviewed.  Constitutional:      Appearance: Normal appearance. She is not ill-appearing.  HENT:     Head: Normocephalic and atraumatic.  Cardiovascular:     Rate and Rhythm: Normal rate and regular rhythm.     Heart sounds: Normal heart sounds. No murmur heard.   No gallop.  Pulmonary:     Effort: Pulmonary effort is normal. No respiratory distress.     Breath sounds: Normal breath sounds. No stridor. No wheezing, rhonchi or rales.     Comments: Port-A-Cath in place left upper chest wall. Skin:    General: Skin is warm and dry.  Neurological:     Mental Status: She is alert and oriented to person, place, and time.    Assessment  Rectal carcinoma, finished with chemotherapy Plan  Patient is scheduled for Port-A-Cath removal in the minor procedure room on 09/25/2021.  The risks and benefits of the procedure including bleeding and infection were fully explained to the patient, who gave informed consent.

## 2021-09-15 NOTE — H&P (Signed)
Brenda Mclean; [id number]; 01-16-1946   HPI Patient is a 75 year old white female who was referred to my care by Dr. Delton Coombes for Port-A-Cath removal.  She has finished with her chemotherapy for rectal cancer. 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 Audrie Lia; transanal resection of 2 cm rectal polyp; pathology revealed superficially invasive moderately differentiated adenocarcinoma arising in adenomatous polyp of distal rectum.   COLONOSCOPY  10/24/2013   Dr. West Carbo; with propofol; small granuloma and sutures distal rectum, small polyp mid rectum removed, sigmoid diverticulosis, otherwise normal exam.  No pathology received.  Recommended colonoscopy in 5 years.   COLONOSCOPY  09/08/2012   Dr. West Carbo; with propofol; multi lobulated friable 3 cm mass distal rectum s/p multiple biopsies, scattered sigmoid diverticulosis, otherwise normal exam.  Pathology with adenomatous polyp.   COLONOSCOPY WITH PROPOFOL N/A 07/10/2020   Procedure: COLONOSCOPY WITH PROPOFOL;  Surgeon: [name], MD;  [facility];  Service: Endoscopy;  Laterality: N/A;  10:00am   POLYPECTOMY  07/10/2020   Procedure: POLYPECTOMY;  Surgeon: [name], MD;  [facility];  Service: Endoscopy;;   PORTACATH PLACEMENT Left 09/09/2020   Procedure: INSERTION PORT-A-CATH;  Surgeon: Aviva Signs, MD;  Location: [facility];  Service: General;  Laterality: Left;   REPLACEMENT TOTAL HIP W/  RESURFACING IMPLANTS Bilateral    REPLACEMENT  TOTAL KNEE Left 07/04/2013   REPLACEMENT TOTAL KNEE Right 12/03/2014   TOTAL SHOULDER ARTHROPLASTY  04/26/2018    Family History  Problem Relation Age of Onset   Arthritis Mother    High blood pressure Mother    Heart disease Father    Breast cancer Maternal Aunt    Stroke Maternal Grandmother    Cancer Paternal Grandmother    Diabetes Brother    Colon cancer Neg Hx     Current Outpatient Medications on File Prior to Visit  Medication Sig Dispense Refill   MULTIPLE VITAMIN PO Take by mouth.     Omega-3 Fatty Acids (FISH OIL) 1200 MG CAPS Take by mouth.     capecitabine (XELODA) 500 MG tablet Take 2 tablets (1,000 mg total) by mouth 2 (two) times daily after a meal. Take Monday- Friday. Take only on days of radiation. (Patient not taking: Reported on 09/15/2021) 120 tablet 0   capecitabine (XELODA) 500 MG tablet TAKE 2 TABLETS (1,000 MG TOTAL) BY MOUTH 2 (TWO) TIMES DAILY AFTER A MEAL. [place]. TAKE ONLY ON DAYS OF RADIATION. (Patient not taking: Reported on 09/15/2021) 120 tablet 0   Current Facility-Administered Medications on File Prior to Visit  Medication Dose Route Frequency Provider Last Rate Last Admin   heparin lock flush 100 unit/mL  500 Units Intracatheter Once PRN [name], MD       sodium chloride flush (NS) 0.9 % injection 10 mL  10 mL Intracatheter PRN [name], MD   10 mL at 11/26/20 0950    No Known Allergies  Social History   Substance and  Sexual Activity  Alcohol Use Never    Social History   Tobacco Use  Smoking Status Never  Smokeless Tobacco Never    Review of Systems  Constitutional: Negative.   HENT: Negative.    Eyes: Negative.   Respiratory: Negative.    Cardiovascular: Negative.   Gastrointestinal: Negative.   Genitourinary: Negative.   Musculoskeletal: Negative.   Skin: Negative.   Neurological: Negative.   Endo/Heme/Allergies: Negative.   Psychiatric/Behavioral: Negative.     Objective   Vitals:    09/15/21 1305  BP: 139/84  Pulse: 72  Resp: 12  Temp: 98.4 F (36.9 C)  SpO2: 96%    Physical Exam Vitals reviewed.  Constitutional:      Appearance: Normal appearance. She is not ill-appearing.  HENT:     Head: Normocephalic and atraumatic.  Cardiovascular:     Rate and Rhythm: Normal rate and regular rhythm.     Heart sounds: Normal heart sounds. No murmur heard.   No gallop.  Pulmonary:     Effort: Pulmonary effort is normal. No respiratory distress.     Breath sounds: Normal breath sounds. No stridor. No wheezing, rhonchi or rales.     Comments: Port-A-Cath in place left upper chest wall. Skin:    General: Skin is warm and dry.  Neurological:     Mental Status: She is alert and oriented to person, place, and time.    Assessment  Rectal carcinoma, finished with chemotherapy Plan  Patient is scheduled for Port-A-Cath removal in the minor procedure room on 09/25/2021.  The risks and benefits of the procedure including bleeding and infection were fully explained to the patient, who gave informed consent.

## 2021-09-22 ENCOUNTER — Other Ambulatory Visit (HOSPITAL_COMMUNITY): Payer: Self-pay

## 2021-09-25 ENCOUNTER — Ambulatory Visit: Admit: 2021-09-25 | Payer: Medicare Other | Admitting: General Surgery

## 2021-09-25 SURGERY — MINOR REMOVAL PORT-A-CATH
Anesthesia: LOCAL

## 2021-10-14 ENCOUNTER — Encounter (HOSPITAL_COMMUNITY): Payer: Self-pay

## 2021-10-14 NOTE — Progress Notes (Signed)
VM received from patient asking that proof that she is seen here at [facility] be faxed to [place]. Upcoming appt schedule and last office note faxed to [phone] at patient's request

## 2021-10-29 NOTE — H&P (Addendum)
Brenda Mclean; [id number]; 30-Aug-1946   HPI Patient is a 75 year old white female who was referred to my care by Dr. Delton Coombes for Port-A-Cath removal.  She has finished with her chemotherapy for rectal cancer. 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 Audrie Lia; transanal resection of 2 cm rectal polyp; pathology revealed superficially invasive moderately differentiated adenocarcinoma arising in adenomatous polyp of distal rectum.   COLONOSCOPY  10/24/2013   Dr. West Carbo; with propofol; small granuloma and sutures distal rectum, small polyp mid rectum removed, sigmoid diverticulosis, otherwise normal exam.  No pathology received.  Recommended colonoscopy in 5 years.   COLONOSCOPY  09/08/2012   Dr. West Carbo; with propofol; multi lobulated friable 3 cm mass distal rectum s/p multiple biopsies, scattered sigmoid diverticulosis, otherwise normal exam.  Pathology with adenomatous polyp.   COLONOSCOPY WITH PROPOFOL N/A 07/10/2020   Procedure: COLONOSCOPY WITH PROPOFOL;  Surgeon: [name], MD;  [facility];  Service: Endoscopy;  Laterality: N/A;  10:00am   POLYPECTOMY  07/10/2020   Procedure: POLYPECTOMY;  Surgeon: [name], MD;  [facility];  Service: Endoscopy;;   PORTACATH PLACEMENT Left 09/09/2020   Procedure: INSERTION PORT-A-CATH;  Surgeon: Aviva Signs, MD;  Location: [facility];  Service: General;  Laterality: Left;   REPLACEMENT TOTAL HIP W/  RESURFACING IMPLANTS Bilateral    REPLACEMENT  TOTAL KNEE Left 07/04/2013   REPLACEMENT TOTAL KNEE Right 12/03/2014   TOTAL SHOULDER ARTHROPLASTY  04/26/2018    Family History  Problem Relation Age of Onset   Arthritis Mother    High blood pressure Mother    Heart disease Father    Breast cancer Maternal Aunt    Stroke Maternal Grandmother    Cancer Paternal Grandmother    Diabetes Brother    Colon cancer Neg Hx     Current Outpatient Medications on File Prior to Visit  Medication Sig Dispense Refill   MULTIPLE VITAMIN PO Take by mouth.     Omega-3 Fatty Acids (FISH OIL) 1200 MG CAPS Take by mouth.     capecitabine (XELODA) 500 MG tablet Take 2 tablets (1,000 mg total) by mouth 2 (two) times daily after a meal. Take Monday- Friday. Take only on days of radiation. (Patient not taking: Reported on 09/15/2021) 120 tablet 0   capecitabine (XELODA) 500 MG tablet TAKE 2 TABLETS (1,000 MG TOTAL) BY MOUTH 2 (TWO) TIMES DAILY AFTER A MEAL. [place]. TAKE ONLY ON DAYS OF RADIATION. (Patient not taking: Reported on 09/15/2021) 120 tablet 0   Current Facility-Administered Medications on File Prior to Visit  Medication Dose Route Frequency Provider Last Rate Last Admin   heparin lock flush 100 unit/mL  500 Units Intracatheter Once PRN [name], MD       sodium chloride flush (NS) 0.9 % injection 10 mL  10 mL Intracatheter PRN [name], MD   10 mL at 11/26/20 0950    No Known Allergies  Social History   Substance and  Sexual Activity  Alcohol Use Never    Social History   Tobacco Use  Smoking Status Never  Smokeless Tobacco Never    Review of Systems  Constitutional: Negative.   HENT: Negative.    Eyes: Negative.   Respiratory: Negative.    Cardiovascular: Negative.   Gastrointestinal: Negative.   Genitourinary: Negative.   Musculoskeletal: Negative.   Skin: Negative.   Neurological: Negative.   Endo/Heme/Allergies: Negative.   Psychiatric/Behavioral: Negative.     Objective   Vitals:    09/15/21 1305  BP: 139/84  Pulse: 72  Resp: 12  Temp: 98.4 F (36.9 C)  SpO2: 96%    Physical Exam Vitals reviewed.  Constitutional:      Appearance: Normal appearance. She is not ill-appearing.  HENT:     Head: Normocephalic and atraumatic.  Cardiovascular:     Rate and Rhythm: Normal rate and regular rhythm.     Heart sounds: Normal heart sounds. No murmur heard.   No gallop.  Pulmonary:     Effort: Pulmonary effort is normal. No respiratory distress.     Breath sounds: Normal breath sounds. No stridor. No wheezing, rhonchi or rales.     Comments: Port-A-Cath in place left upper chest wall. Skin:    General: Skin is warm and dry.  Neurological:     Mental Status: She is alert and oriented to person, place, and time.    Assessment  Rectal carcinoma, finished with chemotherapy Plan  Patient is scheduled for Port-A-Cath removal in the minor procedure room on 11/09/2021.  The risks and benefits of the procedure including bleeding and infection were fully explained to the patient, who gave informed consent.

## 2021-11-09 ENCOUNTER — Encounter (HOSPITAL_COMMUNITY)
Admission: RE | Disposition: A | Discharge status: Home / Self Care | Payer: Self-pay | Source: Ambulatory Visit | Attending: General Surgery

## 2021-11-09 ENCOUNTER — Encounter (HOSPITAL_COMMUNITY): Payer: Self-pay | Admitting: General Surgery

## 2021-11-09 ENCOUNTER — Other Ambulatory Visit: Payer: Self-pay

## 2021-11-09 ENCOUNTER — Ambulatory Visit (HOSPITAL_COMMUNITY)
Admission: RE | Admit: 2021-11-09 | Discharge: 2021-11-09 | Disposition: A | Discharge status: Home / Self Care | Payer: Medicare Other | Source: Ambulatory Visit | Attending: General Surgery | Admitting: General Surgery

## 2021-11-09 DIAGNOSIS — Z9221 Personal history of antineoplastic chemotherapy: Secondary | ICD-10-CM | POA: Diagnosis not present

## 2021-11-09 DIAGNOSIS — Z85048 Personal history of other malignant neoplasm of rectum, rectosigmoid junction, and anus: Secondary | ICD-10-CM | POA: Diagnosis not present

## 2021-11-09 DIAGNOSIS — C20 Malignant neoplasm of rectum: Secondary | ICD-10-CM

## 2021-11-09 DIAGNOSIS — Z452 Encounter for adjustment and management of vascular access device: Secondary | ICD-10-CM | POA: Insufficient documentation

## 2021-11-09 HISTORY — PX: PORT-A-CATH REMOVAL: SHX5289

## 2021-11-09 SURGERY — MINOR REMOVAL PORT-A-CATH
Anesthesia: LOCAL | Site: Chest

## 2021-11-09 MED ORDER — CHLORHEXIDINE GLUCONATE CLOTH 2 % EX PADS: 6.0000 | MEDICATED_PAD | Freq: Once | CUTANEOUS | Status: DC

## 2021-11-09 MED ORDER — LIDOCAINE HCL (PF) 1 % IJ SOLN
INTRAMUSCULAR | Status: DC | PRN
  Administered 2021-11-09: 7 mL

## 2021-11-09 MED ORDER — LIDOCAINE HCL (PF) 1 % IJ SOLN
INTRAMUSCULAR | Status: AC
  Filled 2021-11-09: qty 30

## 2021-11-09 SURGICAL SUPPLY — 20 items
APPLICATOR CHLORAPREP 10.5 ORG (MISCELLANEOUS) ×3 IMPLANT
CLOTH BEACON ORANGE TIMEOUT ST (SAFETY) ×3 IMPLANT
DECANTER SPIKE VIAL GLASS SM (MISCELLANEOUS) ×3 IMPLANT
DERMABOND ADVANCED (GAUZE/BANDAGES/DRESSINGS) ×2
DERMABOND ADVANCED .7 DNX12 (GAUZE/BANDAGES/DRESSINGS) ×1 IMPLANT
DRAPE HALF SHEET 40X57 (DRAPES) ×3 IMPLANT
ELECT REM PT RETURN 9FT ADLT (ELECTROSURGICAL)
ELECTRODE REM PT RTRN 9FT ADLT (ELECTROSURGICAL) IMPLANT
GLOVE SURG POLYISO LF SZ7.5 (GLOVE) ×3 IMPLANT
GLOVE SURG UNDER POLY LF SZ7 (GLOVE) ×3 IMPLANT
GOWN STRL REUS W/TWL LRG LVL3 (GOWN DISPOSABLE) IMPLANT
NEEDLE HYPO 25X1 1.5 SAFETY (NEEDLE) ×3 IMPLANT
PENCIL SMOKE EVACUATOR COATED (MISCELLANEOUS) IMPLANT
SPONGE GAUZE 2X2 8PLY STER LF (GAUZE/BANDAGES/DRESSINGS)
SPONGE GAUZE 2X2 8PLY STRL LF (GAUZE/BANDAGES/DRESSINGS) IMPLANT
SUT MNCRL AB 4-0 PS2 18 (SUTURE) ×3 IMPLANT
SUT VIC AB 3-0 SH 27 (SUTURE) ×3
SUT VIC AB 3-0 SH 27X BRD (SUTURE) ×1 IMPLANT
SYR CONTROL 10ML LL (SYRINGE) ×3 IMPLANT
TOWEL OR 17X26 4PK STRL BLUE (TOWEL DISPOSABLE) ×3 IMPLANT

## 2021-11-09 NOTE — Discharge Instructions (Signed)
Don't lie flat for 4 hours. Tylenol or Ibuprofen as needed for pain. Use icepack to help with swelling/pain.

## 2021-11-09 NOTE — Interval H&P Note (Signed)
History and Physical Interval Note:  11/09/2021 8:30 AM  Brenda Mclean  has presented today for surgery, with the diagnosis of Port in place Rectal adenocarcinoma.  The various methods of treatment have been discussed with the patient and family. After consideration of risks, benefits and other options for treatment, the patient has consented to  Procedure(s) with comments: MINOR REMOVAL PORT-A-CATH (N/A) - pt to arrive at 8:30am as a surgical intervention.  The patient's history has been reviewed, patient examined, no change in status, stable for surgery.  I have reviewed the patient's chart and labs.  Questions were answered to the patient's satisfaction.     Aviva Signs

## 2021-11-09 NOTE — Op Note (Signed)
Patient:  Brenda Mclean  DOB:  December 04, 1946  MRN:  [id number]   Preop Diagnosis: Rectal carcinoma, finished with chemotherapy  Postop Diagnosis: Same  Procedure: Port-A-Cath removal  Surgeon: Aviva Signs, MD  Anes: Local  Indications: Patient is a 75 year old white female who was treated for rectal carcinoma who has finished with her chemotherapy and is in need of Port-A-Cath removal.  The risks and benefits of the procedure were fully explained to the patient, who gave informed consent.  Procedure note: The procedure was performed in the minor procedure room.  The left upper chest was prepped and draped using the usual sterile technique with ChloraPrep.  Surgical site confirmation was performed.  1% Xylocaine was used for local anesthesia.  An incision was made through the previous surgical incision site.  The dissection was taken down to the Port-A-Cath.  The Port-A-Cath was removed in total without difficulty.  The subcutaneous layer was reapproximated using a 3-0 Vicryl interrupted suture.  The skin was closed using a 4-0 Monocryl subcuticular suture.  Dermabond was applied.  All tape and needle counts were correct at the end of the procedure.  The patient was discharged from the minor procedure room in good and stable condition.  Complications: None  EBL: Minimal  Specimen: None

## 2021-11-11 ENCOUNTER — Encounter (HOSPITAL_COMMUNITY): Payer: Self-pay | Admitting: General Surgery

## 2021-12-02 ENCOUNTER — Other Ambulatory Visit (HOSPITAL_COMMUNITY): Payer: Medicare Other

## 2021-12-08 ENCOUNTER — Other Ambulatory Visit (HOSPITAL_COMMUNITY): Payer: Self-pay | Admitting: *Deleted

## 2021-12-08 DIAGNOSIS — D702 Other drug-induced agranulocytosis: Secondary | ICD-10-CM

## 2021-12-08 DIAGNOSIS — C20 Malignant neoplasm of rectum: Secondary | ICD-10-CM

## 2021-12-08 NOTE — Progress Notes (Shared)
[address], [place] [address]   CLINIC:  Medical Oncology/Hematology  PCP:  Pcp, No None None   REASON FOR VISIT:  Follow-up for ***  PRIOR THERAPY: ***  NGS Results: ***  CURRENT THERAPY: ***  BRIEF ONCOLOGIC HISTORY:  Oncology History  Rectal cancer ([place])  08/14/2020 Initial Diagnosis   Rectal cancer ([place])   09/02/2020 Cancer Staging   Staging form: Colon and Rectum, AJCC 8th Edition - Clinical stage from 09/02/2020: Stage IIA (cT3, cN0, cM0) - Signed by [name], MD on 09/02/2020    09/16/2020 -  Chemotherapy   The patient had palonosetron (ALOXI) injection 0.25 mg, 0.25 mg, Intravenous,  Once, 7 of 7 cycles Administration: 0.25 mg (09/16/2020), 0.25 mg (10/07/2020), 0.25 mg (10/21/2020), 0.25 mg (11/04/2020), 0.25 mg (11/26/2020), 0.25 mg (12/10/2020), 0.25 mg (12/31/2020) pegfilgrastim-cbqv (UDENYCA) injection 6 mg, 6 mg, Subcutaneous, Once, 6 of 6 cycles Administration: 6 mg (10/09/2020), 6 mg (10/23/2020), 6 mg (11/06/2020), 6 mg (01/01/2021) leucovorin 700 mg in dextrose 5 % 250 mL infusion, 380 mg/m2 = 736 mg, Intravenous,  Once, 7 of 7 cycles Dose modification: 300 mg/m2 (original dose 400 mg/m2, Cycle 6, Reason: Provider Judgment) Administration: 700 mg (09/16/2020), 700 mg (10/07/2020), 700 mg (10/21/2020), 700 mg (11/04/2020), 700 mg (11/26/2020), 552 mg (12/10/2020), 552 mg (12/31/2020) oxaliplatin (ELOXATIN) 125 mg in dextrose 5 % 500 mL chemo infusion, 68 mg/m2 = 125 mg (80 % of original dose 85 mg/m2), Intravenous,  Once, 7 of 7 cycles Dose modification: 68 mg/m2 (80 % of original dose 85 mg/m2, Cycle 1, Reason: Provider Judgment), 68 mg/m2 (80 % of original dose 85 mg/m2, Cycle 2, Reason: Provider Judgment), 55 mg/m2 (original dose 85 mg/m2, Cycle 4, Reason: Other (see comments), Comment: neuropathy) Administration: 125 mg (09/16/2020), 125 mg (10/07/2020), 125 mg (10/21/2020), 100 mg (11/04/2020), 100 mg (11/26/2020), 100 mg  (12/10/2020), 100 mg (12/31/2020) fluorouracil (ADRUCIL) chemo injection 750 mg, 400 mg/m2 = 750 mg, Intravenous,  Once, 7 of 7 cycles Dose modification: 300 mg/m2 (original dose 400 mg/m2, Cycle 6, Reason: Provider Judgment) Administration: 750 mg (09/16/2020), 750 mg (10/07/2020), 750 mg (10/21/2020), 750 mg (11/04/2020), 750 mg (11/26/2020), 550 mg (12/10/2020), 550 mg (12/31/2020) fluorouracil (ADRUCIL) 4,400 mg in sodium chloride 0.9 % 62 mL chemo infusion, 2,400 mg/m2 = 4,400 mg, Intravenous, 1 Day/Dose, 7 of 7 cycles Dose modification: 1,920 mg/m2 (80 % of original dose 2,400 mg/m2, Cycle 5, Reason: Other (see comments), Comment: diarrhea), 1,800 mg/m2 (original dose 2,400 mg/m2, Cycle 6, Reason: Provider Judgment) Administration: 4,400 mg (09/16/2020), 4,400 mg (10/07/2020), 4,400 mg (10/21/2020), 4,400 mg (11/04/2020), 3,550 mg (11/26/2020), 3,300 mg (12/10/2020), 3,300 mg (12/31/2020)   for chemotherapy treatment.       CANCER STAGING: Cancer Staging  Rectal cancer [facility]) Staging form: Colon and Rectum, AJCC 8th Edition - Clinical stage from 09/02/2020: Stage IIA (cT3, cN0, cM0) - Signed by [name], MD on 09/02/2020   INTERVAL HISTORY:  [name], a 75 y.o. female, returns for routine follow-up of her ***. [name] was last seen on {XX/XX/XXXX}.    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 Audrie Lia; transanal resection of 2  cm rectal polyp; pathology revealed superficially invasive moderately differentiated adenocarcinoma arising in adenomatous polyp of distal rectum.   COLONOSCOPY  10/24/2013   Dr. West Carbo; with propofol; small granuloma and sutures distal rectum, small polyp mid rectum removed, sigmoid diverticulosis, otherwise normal exam.  No pathology received.  Recommended colonoscopy in 5 years.   COLONOSCOPY  09/08/2012   Dr. West Carbo; with propofol; multi lobulated friable 3 cm mass distal rectum s/p multiple biopsies, scattered sigmoid diverticulosis, otherwise normal exam.  Pathology with adenomatous polyp.   COLONOSCOPY WITH PROPOFOL N/A 07/10/2020   Procedure: COLONOSCOPY WITH PROPOFOL;  Surgeon: [name], MD;  [facility];  Service: Endoscopy;  Laterality: N/A;  10:00am   POLYPECTOMY  07/10/2020   Procedure: POLYPECTOMY;  Surgeon: [name], MD;  [facility];  Service: Endoscopy;;   PORT-A-CATH REMOVAL N/A 11/09/2021   Procedure: MINOR REMOVAL PORT-A-CATH;  Surgeon: Aviva Signs, MD;  Location: [facility];  Service: General;  Laterality: N/A;  pt to arrive at 8:30am   PORTACATH PLACEMENT Left 09/09/2020   Procedure: INSERTION PORT-A-CATH;  Surgeon: Aviva Signs, MD;  Location: [facility];  Service: General;  Laterality: Left;   REPLACEMENT TOTAL HIP W/  RESURFACING IMPLANTS Bilateral    REPLACEMENT TOTAL KNEE Left 07/04/2013   REPLACEMENT TOTAL KNEE Right 12/03/2014   TOTAL SHOULDER ARTHROPLASTY  04/26/2018    SOCIAL HISTORY:  Social History   Socioeconomic History   Marital status: Widowed    Spouse name: Not on file   Number of children: 2   Years of education: Not on file   Highest education level: Not on file  Occupational History    Employer: retired  Tobacco Use   Smoking status: Never   Smokeless tobacco: Never  Vaping Use   Vaping Use: Never used  Substance and Sexual Activity   Alcohol use: Never   Drug use: Never   Sexual activity: Not  Currently  Other Topics Concern   Not on file  Social History Narrative   Not on file   Social Determinants of Health   Financial Resource Strain: Not on file  Food Insecurity: Not on file  Transportation Needs: Not on file  Physical Activity: Not on file  Stress: Not on file  Social Connections: Not on file  Intimate Partner Violence: Not on file    FAMILY HISTORY:  Family History  Problem Relation Age of Onset   Arthritis Mother    High blood pressure Mother    Heart disease Father    Breast cancer Maternal Aunt    Stroke Maternal Grandmother    Cancer Paternal Grandmother    Diabetes Brother    Colon cancer Neg Hx     CURRENT MEDICATIONS:  Current Outpatient Medications  Medication Sig Dispense Refill   MULTIPLE VITAMIN PO Take 1 tablet by mouth daily.     Omega-3 Fatty Acids (FISH OIL) 300 MG CAPS Take 300 mg by mouth daily.     No current facility-administered medications for this visit.   Facility-Administered Medications Ordered in Other Visits  Medication Dose Route Frequency Provider Last Rate Last Admin   heparin lock flush 100 unit/mL  500 Units Intracatheter Once PRN [name], MD  sodium chloride flush (NS) 0.9 % injection 10 mL  10 mL Intracatheter PRN [name], MD   10 mL at 11/26/20 0950    ALLERGIES:  No Known Allergies  PHYSICAL EXAM:  Performance status (ECOG): {CHL ONC [id number]}  There were no vitals filed for this visit. Wt Readings from Last 3 Encounters:  09/15/21 152 lb (68.9 kg)  08/18/21 151 lb 3.8 oz (68.6 kg)  06/02/21 150 lb 1.6 oz (68.1 kg)   Physical Exam   LABORATORY DATA:  I have reviewed the labs as listed.  CBC Latest Ref Rng & Units 08/18/2021 05/29/2021 04/30/2021  WBC 4.0 - 10.5 K/uL 5.4 6.1 4.0  Hemoglobin 12.0 - 15.0 g/dL 12.6 12.6 12.9  Hematocrit 36.0 - 46.0 % 38.6 39.2 39.8  Platelets 150 - 400 K/uL 256 157 172   CMP Latest Ref Rng & Units 08/18/2021 05/29/2021 04/30/2021  Glucose 70 -  99 mg/dL 91 98 124(H)  BUN 8 - 23 mg/dL 14 15 14   Creatinine 0.44 - 1.00 mg/dL 0.77 0.82 0.83  Sodium 135 - 145 mmol/L 142 141 141  Potassium 3.5 - 5.1 mmol/L 3.5 3.2(L) 3.4(L)  Chloride 98 - 111 mmol/L 107 106 104  CO2 22 - 32 mmol/L 28 27 29   Calcium 8.9 - 10.3 mg/dL 9.0 9.2 9.5  Total Protein 6.5 - 8.1 g/dL 7.0 6.8 6.9  Total Bilirubin 0.3 - 1.2 mg/dL 0.3 0.7 0.7  Alkaline Phos 38 - 126 U/L 93 74 80  AST 15 - 41 U/L 19 21 24   ALT 0 - 44 U/L 13 17 23     DIAGNOSTIC IMAGING:  I have independently reviewed the scans and discussed with the patient. No results found.   ASSESSMENT:  ***   PLAN:  ***   Orders placed this encounter:  No orders of the defined types were placed in this encounter.    [name], MD [facility] [phone]   I, ***, am acting as a scribe for Dr. [name].  {Add Barista Statement}

## 2021-12-09 ENCOUNTER — Ambulatory Visit (HOSPITAL_COMMUNITY): Payer: Medicare Other | Admitting: Hematology

## 2021-12-09 ENCOUNTER — Inpatient Hospital Stay (HOSPITAL_COMMUNITY): Payer: Medicare Other

## 2021-12-30 ENCOUNTER — Other Ambulatory Visit: Payer: Self-pay | Admitting: General Surgery

## 2021-12-30 DIAGNOSIS — C20 Malignant neoplasm of rectum: Secondary | ICD-10-CM

## 2022-02-01 NOTE — Progress Notes (Signed)
Results reviewed with patient at 06/02/21 visit by Dr. Lorenso Courier

## 2022-02-04 ENCOUNTER — Other Ambulatory Visit: Payer: Self-pay | Admitting: General Surgery

## 2022-02-04 ENCOUNTER — Ambulatory Visit
Admission: RE | Admit: 2022-02-04 | Discharge: 2022-02-04 | Disposition: A | Discharge status: Home / Self Care | Payer: Medicare Other | Source: Ambulatory Visit | Attending: General Surgery | Admitting: General Surgery

## 2022-02-04 DIAGNOSIS — C20 Malignant neoplasm of rectum: Secondary | ICD-10-CM

## 2022-02-04 IMAGING — MR MR PELVIS W/O CM
7 of 8 series · 36 of 48 positions shown · non-contrast
Comparison: [DATE]

CLINICAL DATA: Status post radiation therapy and chemotherapy for
rectal carcinoma.

EXAM:
MRI PELVIS WITHOUT CONTRAST
TECHNIQUE: Multiplanar multisequence MR imaging of the pelvis was performed. No
intravenous contrast was administered.

[Series 2: t2_tse_sag · sagittal · 3.0mm · 0.47mm/px · 5 of 37 slices shown]
[im 1/37]
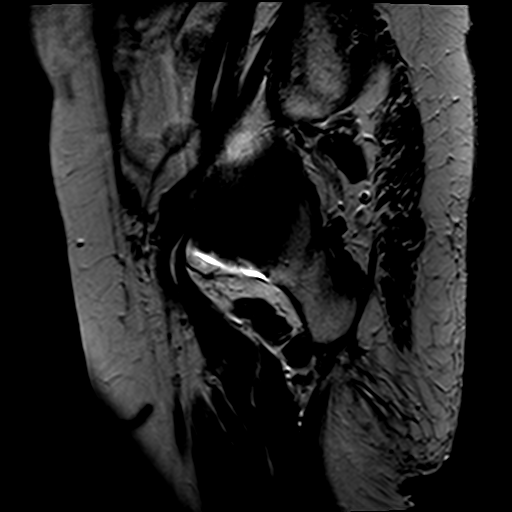
[im 10/37]
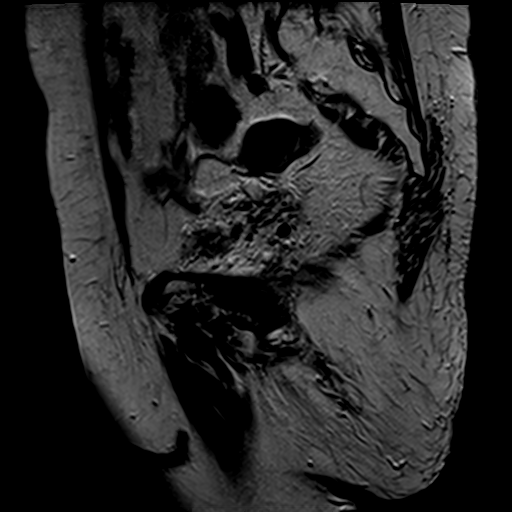
[im 19/37]
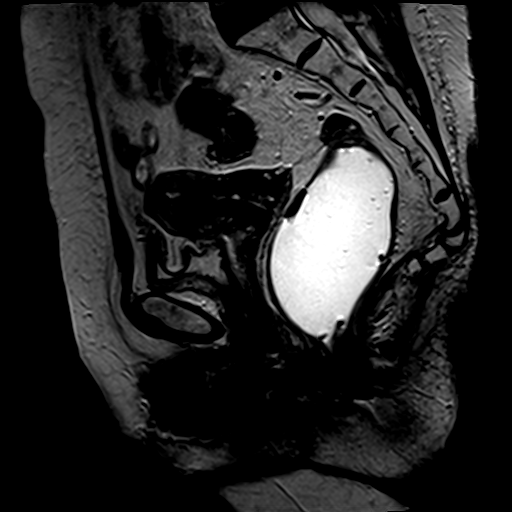
[im 28/37]
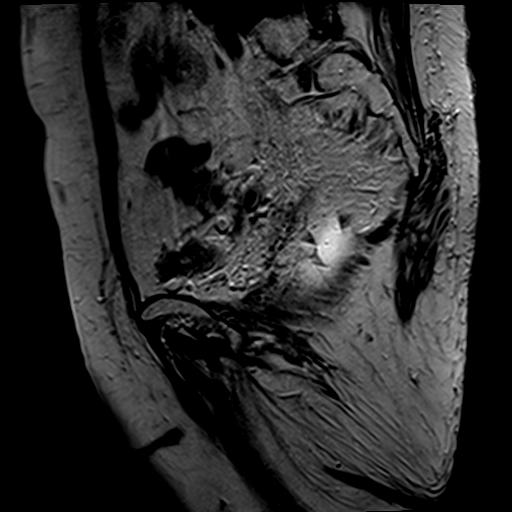
[im 37/37]
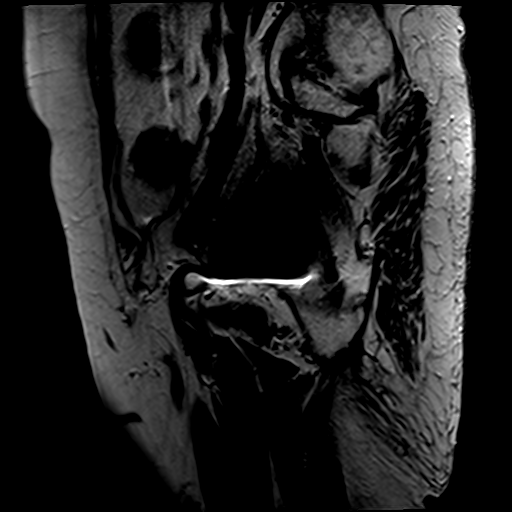

[Series 3: t2_tse_cor · coronal · 3.0mm · 0.35mm/px · 5 of 37 slices shown]
[im 1/37]
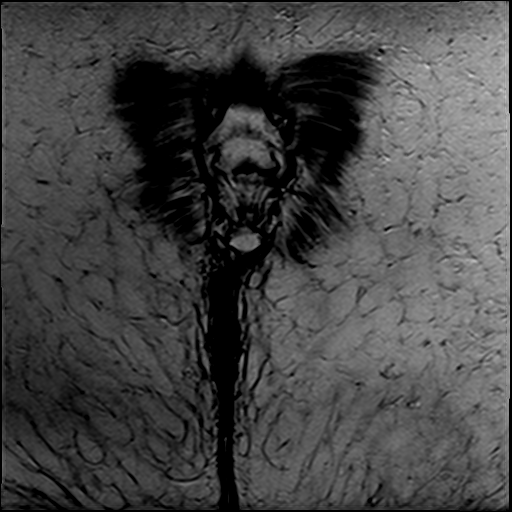
[im 10/37]
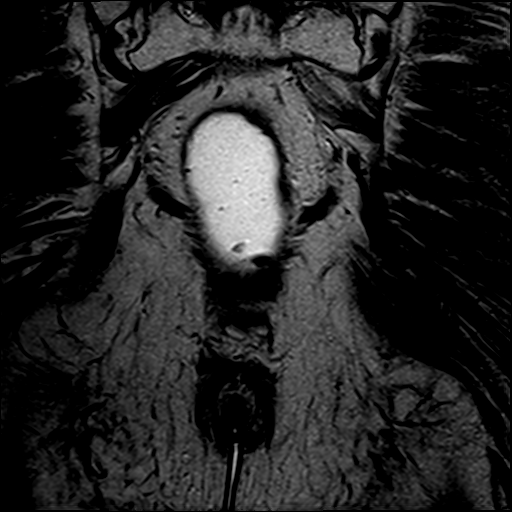
[im 19/37]
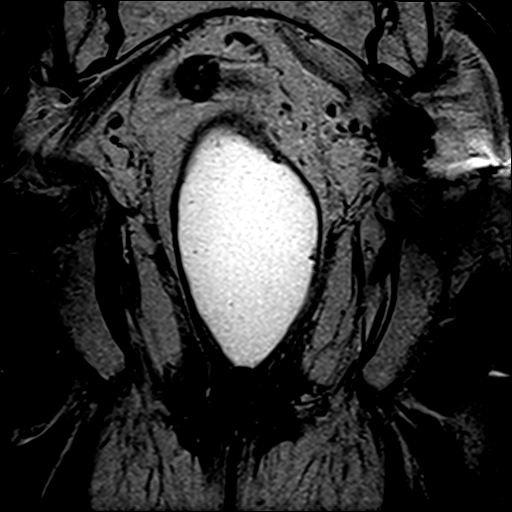
[im 28/37]
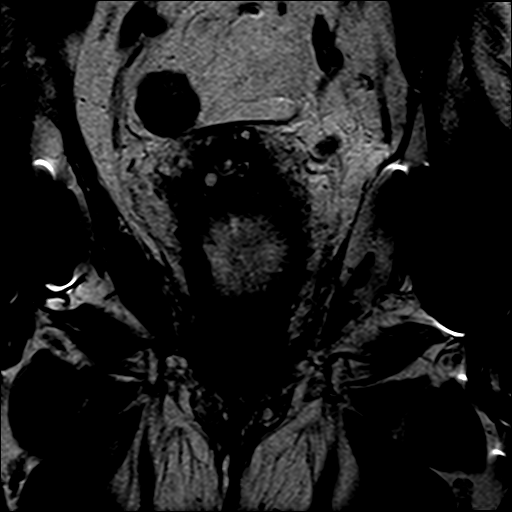
[im 37/37]
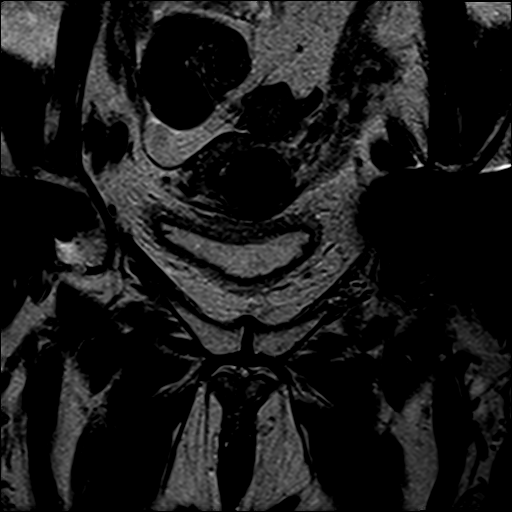

[Series 4: ep2d_diff_b0_100_500 · axial · 5.0mm · 1.82mm/px · z∈[-84,+113]mm · 8 of 77 slices shown]
[im 1/77]
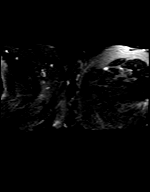
[im 9/77]
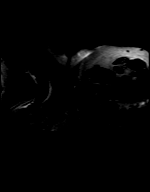
[im 26/77]
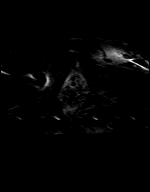
[im 34/77]
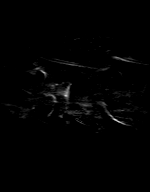
[im 43/77]
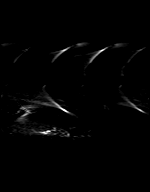
[im 51/77]
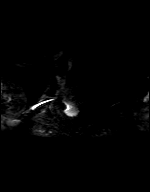
[im 68/77]
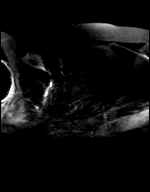
[im 77/77]
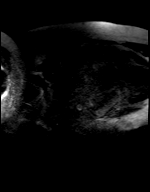

[Series 5: ep2d_diff_b0_100_500_adc · axial · 5.0mm · 1.82mm/px · z∈[-84,+113]mm · 4 of 34 slices shown]
[im 1/34]
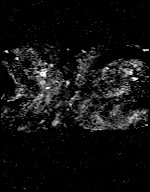
[im 12/34]
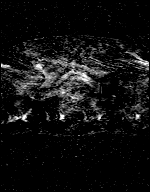
[im 23/34]
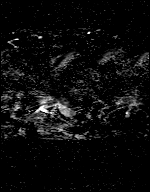
[im 34/34]
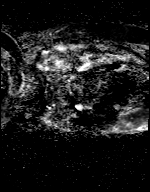

[Series 6: T2 fat-sat · axial · 5.0mm · 1.19mm/px · z∈[-82,+122]mm · 4 of 35 slices shown]
[im 1/35]
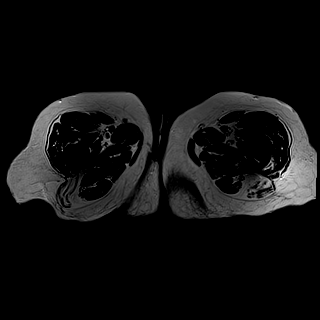
[im 12/35]
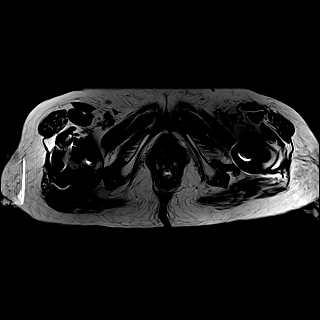
[im 23/35]
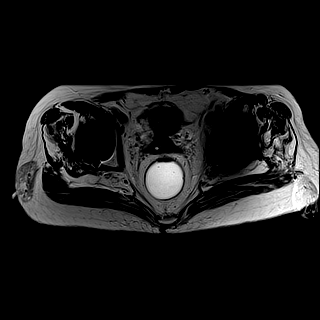
[im 35/35]
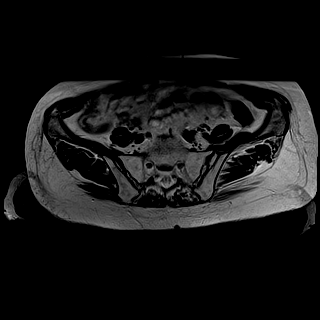

[Series 7: T2 · coronal · 3.0mm · 0.78mm/px · 7 of 55 slices shown (1 of 2)]
[im 1/55]
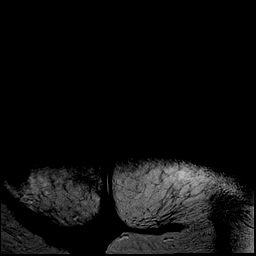
[im 10/55]
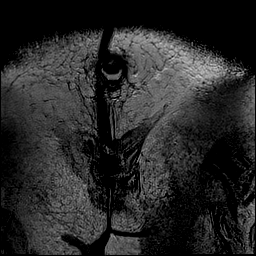
[im 19/55]
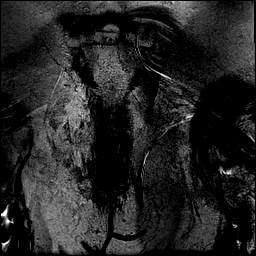
[im 28/55]
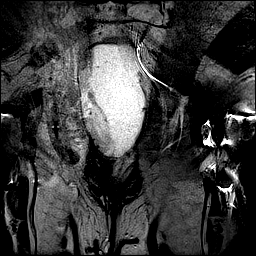
[im 37/55]
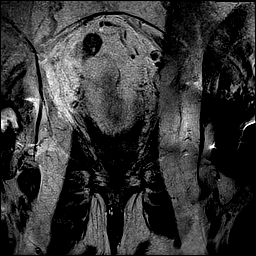
[im 46/55]
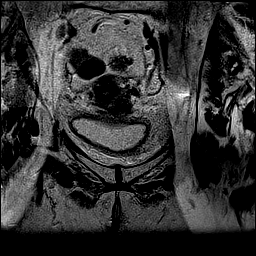
[im 55/55]
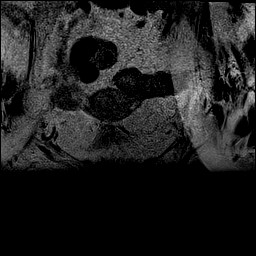

[Series 8: T2 · axial · 3.0mm · 0.35mm/px · z∈[-12,+41]mm · 3 of 50 slices shown (2 of 2)]
[im 1/50]
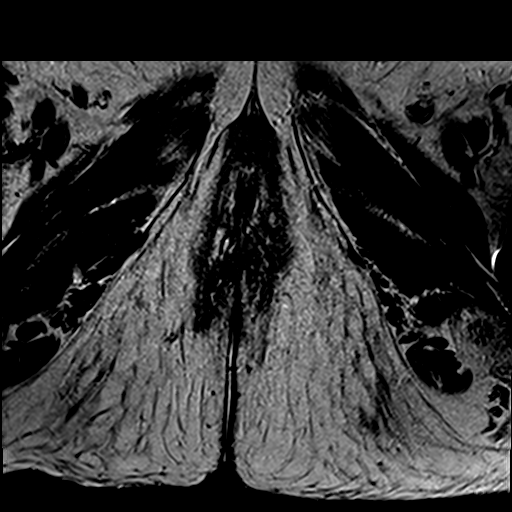
[im 10/50]
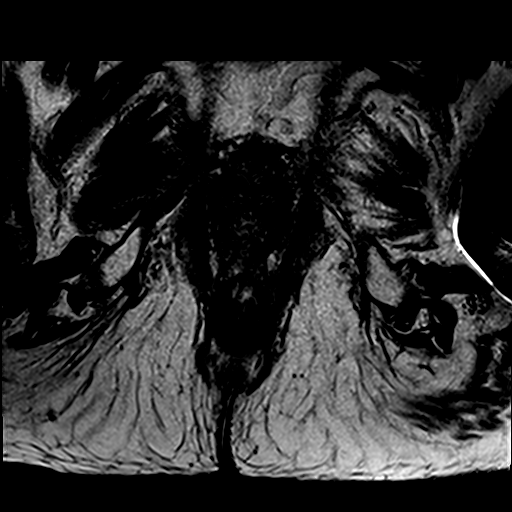
[im 20/50]
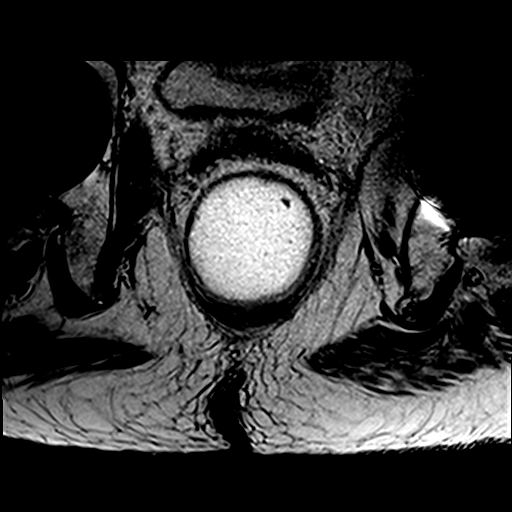

[36 of 48 positions shown; findings below may reference images not displayed]

FINDINGS: Susceptibility artifact from bilateral hip arthroplasties, primarily
degrading the diffusion-weighted images, which are nondiagnostic.

Urinary Tract:  Normal urinary bladder.  No distal hydroureter.

Bowel: The sigmoid is underdistended and not well evaluated. From
the level of the upper rectum to anorectal junction, there is good
distension, without evidence of mass.

Extensive proximal sigmoid colonic diverticulosis with wall
thickening, likely related to muscular hypertrophy.

Vascular/Lymphatic: No pelvic aneurysm or sidewall adenopathy. No
perirectal or sigmoid mesocolon adenopathy.

Reproductive: Dominant anterior uterine body fibroid of 2.1 cm on
[DATE]. No adnexal mass.

Other:  No significant free fluid.

Musculoskeletal: Bilateral hip arthroplasty.
IMPRESSION: No findings of recurrent or metastatic rectal carcinoma.
Susceptibility artifact degradation, primarily involving the
diffusion-weighted series.

## 2022-04-06 ENCOUNTER — Encounter: Payer: Self-pay | Admitting: Internal Medicine

## 2022-04-27 ENCOUNTER — Encounter: Payer: Self-pay | Admitting: Internal Medicine

## 2022-04-27 ENCOUNTER — Ambulatory Visit (INDEPENDENT_AMBULATORY_CARE_PROVIDER_SITE_OTHER): Payer: Medicare Other | Admitting: Internal Medicine

## 2022-04-27 ENCOUNTER — Telehealth: Payer: Self-pay | Admitting: *Deleted

## 2022-04-27 ENCOUNTER — Encounter: Payer: Self-pay | Admitting: *Deleted

## 2022-04-27 VITALS — BP 145/75 | HR 67 | Temp 97.3°F | Ht 61.0 in | Wt 146.2 lb

## 2022-04-27 DIAGNOSIS — C20 Malignant neoplasm of rectum: Secondary | ICD-10-CM

## 2022-04-27 MED ORDER — PEG 3350-KCL-NA BICARB-NACL 420 G PO SOLR: ORAL | 0 refills | Status: AC

## 2022-04-27 NOTE — Progress Notes (Signed)
? ? ?Primary Care Physician:  Pcp, No ?Primary Gastroenterologist:  Dr. Gala Romney ? ?Pre-Procedure History & Physical: ?HPI:  Brenda Mclean is a 76 y.o. female here for consideration of surveillance colonoscopy.  Patient was found to have a rectal cancer at colonoscopy by me performed in 2021.  She underwent neoadjuvant chemoradiation (received radiation in [place]).  Of note, this lady underwent a transanal resection of a polyp with early stage colon cancer in [place] approximately 10 years ago.  Recently, Dr. Marcello Moores performed a proctoscopy which revealed perianal surgical scar but no evidence of recurrent cancer.  Moreover, pelvic MRI ?Demonstrated no evidence of rectal tumor. ?In addition to finding rectal cancer in 2021,  She did have a colonic adenoma removed and segmental biopsies were negative for microscopic colitis.  She does have a longstanding history of intermittent diarrhea most consistent with irritable bowel syndrome. ?Aside from occasional diarrhea,she is devoid of any lower GI tract symptoms.  Certainly, no rectal bleeding reported ?Past Medical History:  ?Diagnosis Date  ? Arthritis   ? COVID-19 03/2019  ? GERD (gastroesophageal reflux disease)   ? HLD (hyperlipidemia)   ? HTN (hypertension)   ? Port-A-Cath in place 09/09/2020  ? 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 Sigmoid diverticulitis 2016  ? Uncomplicated  ? ? ?Past Surgical History:  ?Procedure Laterality Date  ? BIOPSY  07/10/2020  ? Procedure: BIOPSY;  Surgeon: [name], MD;  [facility];  Service: Endoscopy;;  ? CHOLECYSTECTOMY  1998  ? COLON SURGERY  10/30/2012  ? Dr. Audrie Lia; transanal resection of 2 cm rectal polyp; pathology revealed superficially invasive moderately differentiated adenocarcinoma arising in adenomatous polyp of distal rectum.  ? COLONOSCOPY  10/24/2013  ? Dr. West Carbo; with propofol; small granuloma and sutures distal rectum, small polyp mid rectum removed, sigmoid diverticulosis, otherwise normal  exam.  No pathology received.  Recommended colonoscopy in 5 years.  ? COLONOSCOPY  09/08/2012  ? Dr. West Carbo; with propofol; multi lobulated friable 3 cm mass distal rectum s/p multiple biopsies, scattered sigmoid diverticulosis, otherwise normal exam.  Pathology with adenomatous polyp.  ? COLONOSCOPY WITH PROPOFOL N/A 07/10/2020  ? Procedure: COLONOSCOPY WITH PROPOFOL;  Surgeon: [name], MD;  [facility];  Service: Endoscopy;  Laterality: N/A;  10:00am  ? POLYPECTOMY  07/10/2020  ? Procedure: POLYPECTOMY;  Surgeon: [name], MD;  [facility];  Service: Endoscopy;;  ? PORT-A-CATH REMOVAL N/A 11/09/2021  ? Procedure: MINOR REMOVAL PORT-A-CATH;  Surgeon: Aviva Signs, MD;  Location: [facility];  Service: General;  Laterality: N/A;  pt to arrive at 8:30am  ? PORTACATH PLACEMENT Left 09/09/2020  ? Procedure: INSERTION PORT-A-CATH;  Surgeon: Aviva Signs, MD;  Location: [facility];  Service: General;  Laterality: Left;  ? REPLACEMENT TOTAL HIP W/  RESURFACING IMPLANTS Bilateral   ? REPLACEMENT TOTAL KNEE Left 07/04/2013  ? REPLACEMENT TOTAL KNEE Right 12/03/2014  ? TOTAL SHOULDER ARTHROPLASTY  04/26/2018  ? ? ?Prior to Admission medications   ?Medication Sig Start Date End Date Taking? Authorizing Provider  ?MULTIPLE VITAMIN PO Take 1 tablet by mouth daily.   Yes [provider]  ?Omega-3 Fatty Acids (FISH OIL) 300 MG CAPS Take 300 mg by mouth daily.   Yes [provider]  ? ? ?Allergies as of 04/27/2022  ? (No Known Allergies)  ? ? ?Family History  ?Problem Relation Age of Onset  ? Arthritis Mother   ? High blood pressure Mother   ? Heart disease Father   ?  Breast cancer Maternal Aunt   ? Stroke Maternal Grandmother   ? Cancer Paternal Grandmother   ? Diabetes Brother   ? Colon cancer Neg Hx   ? ? ?Social History  ? ?Socioeconomic History  ? Marital status: Widowed  ?  Spouse name: Not on file  ? Number of children: 2  ? Years of education: Not on file  ? Highest  education level: Not on file  ?Occupational History  ?  Employer: retired  ?Tobacco Use  ? Smoking status: Never  ? Smokeless tobacco: Never  ?Vaping Use  ? Vaping Use: Never used  ?Substance and Sexual Activity  ? Alcohol use: Never  ? Drug use: Never  ? Sexual activity: Not Currently  ?Other Topics Concern  ? Not on file  ?Social History Narrative  ? Not on file  ? ?Social Determinants of Health  ? ?Financial Resource Strain: Not on file  ?Food Insecurity: Not on file  ?Transportation Needs: Not on file  ?Physical Activity: Not on file  ?Stress: Not on file  ?Social Connections: Not on file  ?Intimate Partner Violence: Not on file  ? ? ?Review of Systems: ?See HPI, otherwise negative ROS ? ?Physical Exam: ?BP (!) 145/75 (BP Location: Right Arm, Patient Position: Sitting, Cuff Size: Normal)   Pulse 67   Temp (!) 97.3 ?F (36.3 ?C) (Temporal)   Ht '5\' 1"'$  (1.549 m)   Wt 146 lb 3.2 oz (66.3 kg)   SpO2 93%   BMI 27.62 kg/m?  ?General:   Alert,  Well-developed, well-nourished, pleasant and cooperative in NAD ?Skin:  Intact without significant lesions or rashes. ?Eyes:  Sclera clear, no icterus.   Conjunctiva pink. ?Neck:  Supple; no masses or thyromegaly. No significant cervical adenopathy. ?Lungs:  Clear throughout to auscultation.   No wheezes, crackles, or rhonchi. No acute distress. ?Heart:  Regular rate and rhythm; no murmurs, clicks, rubs,  or gallops. ?Abdomen: Non-distended, normal bowel sounds.  Soft and nontender without appreciable mass or hepatosplenomegaly.  ?Pulses:  Normal pulses noted. ?Extremities:  Without clubbing or edema. ? ?Impression/Plan: 76 year old lady with a history of metachronous rectal cancer.  Status post neoadjuvant chemo and radiation with an apparent excellent response to treatment.  Recent pelvic MRI reassuring as well.  History of colonic adenoma. ?Findings of recent proctoscopy noted. ?She is here to set up a surveillance colonoscopy. ?Patient with longstanding IBS D-managed  with as needed Imodium. ? ?Recommendations: ? ?I have offered this nice lady a surveillance colonoscopy. ?The risks, benefits, limitations, alternatives and imponderables have been reviewed with the patient. Questions have been answered. All parties are agreeable.   ? ?As she is managing fairly well on Imodium alone, I suggested she continue this treatment for her occasional diarrhea. ? ?Further recommendations to follow. ? ? ? ? ?Notice: This dictation was prepared with Dragon dictation along with smaller phrase technology. Any transcriptional errors that result from this process are unintentional and may not be corrected upon review.   ?

## 2022-04-27 NOTE — Telephone Encounter (Signed)
Received VM from pt. Called pt and she needed to r/s procedure. She is now on for 6/12 at 11:30am. Aware will send new instructions. Message sent to endo to change. ?

## 2022-04-27 NOTE — Patient Instructions (Signed)
It was great to see you again today! ? ?As discussed, we will set up a surveillance colonoscopy (history of rectal cancer).  ASA 2. ? ?Continue using Imodium as needed to prevent diarrhea.  May use up to 4 times a day ? ?Further recommendations to follow. ?

## 2022-04-29 NOTE — Telephone Encounter (Signed)
Patient returned call. She is aware 6/12 cancelled. Will call once we get more of the June schedule ?

## 2022-04-29 NOTE — Telephone Encounter (Signed)
Called pt, LMOVM to call back to r/s procedure on for 6/12. Dr. Gala Romney will not be in that day ?

## 2022-05-07 NOTE — Telephone Encounter (Signed)
Patient called back. Needed to move procedure time down to 11:30am. She is aware she will need to arrive at 10am. Hand wrote on instructions mailed to patient new arrival time and new prep time for second half of prep. ?

## 2022-05-07 NOTE — Telephone Encounter (Signed)
Spoke with pt. She has been scheduled for 6/21 at 9:15am. Aware will mail instructions. Already has prep at home. ?

## 2022-06-05 ENCOUNTER — Other Ambulatory Visit: Payer: Self-pay | Admitting: Nurse Practitioner

## 2022-06-16 ENCOUNTER — Ambulatory Visit (HOSPITAL_BASED_OUTPATIENT_CLINIC_OR_DEPARTMENT_OTHER): Payer: Medicare Other | Admitting: Anesthesiology

## 2022-06-16 ENCOUNTER — Encounter (HOSPITAL_COMMUNITY): Payer: Self-pay | Admitting: Internal Medicine

## 2022-06-16 ENCOUNTER — Ambulatory Visit (HOSPITAL_COMMUNITY)
Admission: RE | Admit: 2022-06-16 | Discharge: 2022-06-16 | Disposition: A | Discharge status: Home / Self Care | Payer: Medicare Other | Attending: Internal Medicine | Admitting: Internal Medicine

## 2022-06-16 ENCOUNTER — Encounter (HOSPITAL_COMMUNITY)
Admission: RE | Disposition: A | Discharge status: Home / Self Care | Payer: Self-pay | Source: Home / Self Care | Attending: Internal Medicine

## 2022-06-16 ENCOUNTER — Ambulatory Visit (HOSPITAL_COMMUNITY): Payer: Medicare Other | Admitting: Anesthesiology

## 2022-06-16 ENCOUNTER — Other Ambulatory Visit: Payer: Self-pay

## 2022-06-16 DIAGNOSIS — Z9221 Personal history of antineoplastic chemotherapy: Secondary | ICD-10-CM | POA: Insufficient documentation

## 2022-06-16 DIAGNOSIS — Z95828 Presence of other vascular implants and grafts: Secondary | ICD-10-CM

## 2022-06-16 DIAGNOSIS — D124 Benign neoplasm of descending colon: Secondary | ICD-10-CM | POA: Insufficient documentation

## 2022-06-16 DIAGNOSIS — Z1211 Encounter for screening for malignant neoplasm of colon: Secondary | ICD-10-CM | POA: Diagnosis not present

## 2022-06-16 DIAGNOSIS — I10 Essential (primary) hypertension: Secondary | ICD-10-CM | POA: Insufficient documentation

## 2022-06-16 DIAGNOSIS — M1990 Unspecified osteoarthritis, unspecified site: Secondary | ICD-10-CM | POA: Insufficient documentation

## 2022-06-16 DIAGNOSIS — K635 Polyp of colon: Secondary | ICD-10-CM

## 2022-06-16 DIAGNOSIS — D120 Benign neoplasm of cecum: Secondary | ICD-10-CM | POA: Diagnosis not present

## 2022-06-16 DIAGNOSIS — C20 Malignant neoplasm of rectum: Secondary | ICD-10-CM

## 2022-06-16 DIAGNOSIS — K529 Noninfective gastroenteritis and colitis, unspecified: Secondary | ICD-10-CM | POA: Diagnosis not present

## 2022-06-16 DIAGNOSIS — K5730 Diverticulosis of large intestine without perforation or abscess without bleeding: Secondary | ICD-10-CM | POA: Insufficient documentation

## 2022-06-16 DIAGNOSIS — Z8261 Family history of arthritis: Secondary | ICD-10-CM | POA: Insufficient documentation

## 2022-06-16 DIAGNOSIS — Z86010 Personal history of colonic polyps: Secondary | ICD-10-CM

## 2022-06-16 DIAGNOSIS — Z85048 Personal history of other malignant neoplasm of rectum, rectosigmoid junction, and anus: Secondary | ICD-10-CM | POA: Insufficient documentation

## 2022-06-16 DIAGNOSIS — Z923 Personal history of irradiation: Secondary | ICD-10-CM | POA: Diagnosis not present

## 2022-06-16 DIAGNOSIS — Z08 Encounter for follow-up examination after completed treatment for malignant neoplasm: Secondary | ICD-10-CM

## 2022-06-16 DIAGNOSIS — Z85038 Personal history of other malignant neoplasm of large intestine: Secondary | ICD-10-CM

## 2022-06-16 DIAGNOSIS — Z8249 Family history of ischemic heart disease and other diseases of the circulatory system: Secondary | ICD-10-CM | POA: Insufficient documentation

## 2022-06-16 DIAGNOSIS — R197 Diarrhea, unspecified: Secondary | ICD-10-CM | POA: Diagnosis not present

## 2022-06-16 DIAGNOSIS — D702 Other drug-induced agranulocytosis: Secondary | ICD-10-CM

## 2022-06-16 HISTORY — PX: BIOPSY: SHX5522

## 2022-06-16 HISTORY — PX: COLONOSCOPY WITH PROPOFOL: SHX5780

## 2022-06-16 HISTORY — PX: POLYPECTOMY: SHX5525

## 2022-06-16 SURGERY — COLONOSCOPY WITH PROPOFOL
Anesthesia: General

## 2022-06-16 MED ORDER — PROPOFOL 500 MG/50ML IV EMUL
INTRAVENOUS | Status: AC
  Filled 2022-06-16: qty 50

## 2022-06-16 MED ORDER — PROPOFOL 10 MG/ML IV BOLUS
INTRAVENOUS | Status: DC | PRN
  Administered 2022-06-16: 50 mg via INTRAVENOUS

## 2022-06-16 MED ORDER — PROPOFOL 500 MG/50ML IV EMUL
INTRAVENOUS | Status: DC | PRN
  Administered 2022-06-16: 150 ug/kg/min via INTRAVENOUS

## 2022-06-16 MED ORDER — LACTATED RINGERS IV SOLN: INTRAVENOUS | Status: DC

## 2022-06-16 NOTE — Anesthesia Preprocedure Evaluation (Addendum)
Anesthesia Evaluation  Patient identified by MRN, date of birth, ID band Patient awake    Reviewed: Allergy & Precautions, NPO status , Patient's Chart, lab work & pertinent test results  Airway Mallampati: II  TM Distance: >3 FB Neck ROM: Full    Dental  (+) Dental Advisory Given, Missing   Pulmonary neg pulmonary ROS,    Pulmonary exam normal breath sounds clear to auscultation       Cardiovascular Exercise Tolerance: Good hypertension, Pt. on medications Normal cardiovascular exam Rhythm:Regular Rate:Normal     Neuro/Psych negative neurological ROS  negative psych ROS   GI/Hepatic Neg liver ROS, GERD  Controlled,Rectal adenocarcinoma   Endo/Other  negative endocrine ROS  Renal/GU negative Renal ROS  negative genitourinary   Musculoskeletal  (+) Arthritis , Osteoarthritis,    Abdominal   Peds negative pediatric ROS (+)  Hematology negative hematology ROS (+)   Anesthesia Other Findings   Reproductive/Obstetrics negative OB ROS                            Anesthesia Physical Anesthesia Plan  ASA: 3  Anesthesia Plan: General   Post-op Pain Management: Minimal or no pain anticipated   Induction: Intravenous  PONV Risk Score and Plan: Propofol infusion  Airway Management Planned: Nasal Cannula and Natural Airway  Additional Equipment:   Intra-op Plan:   Post-operative Plan:   Informed Consent: I have reviewed the patients History and Physical, chart, labs and discussed the procedure including the risks, benefits and alternatives for the proposed anesthesia with the patient or authorized representative who has indicated his/her understanding and acceptance.     Dental advisory given  Plan Discussed with: CRNA and Surgeon  Anesthesia Plan Comments:         Anesthesia Quick Evaluation

## 2022-06-16 NOTE — Op Note (Signed)
[facility] Patient Name: Brenda Mclean Procedure Date: 06/16/2022 11:28 AM MRN: [id number] Date of Birth: 01-30-1946 Attending MD: Norvel Richards , MD CSN: [id number] Age: 76 Admit Type: Outpatient Procedure:                Colonoscopy Indications:              High risk colon cancer surveillance: Personal                            history of colon cancer; chronic diarrhea Providers:                Norvel Richards, MD, Lurline Del, RN, Randa Spike, Technician, Everardo Pacific Referring MD:              Medicines:                Propofol per Anesthesia Complications:            No immediate complications. Estimated Blood Loss:     Estimated blood loss: none. Procedure:                Pre-Anesthesia Assessment:                           - Prior to the procedure, a History and Physical                            was performed, and patient medications and                            allergies were reviewed. The patient's tolerance of                            previous anesthesia was also reviewed. The risks                            and benefits of the procedure and the sedation                            options and risks were discussed with the patient.                            All questions were answered, and informed consent                            was obtained. Prior Anticoagulants: The patient has                            taken no previous anticoagulant or antiplatelet                            agents. ASA Grade Assessment: III - A patient with  severe systemic disease. After reviewing the risks                            and benefits, the patient was deemed in                            satisfactory condition to undergo the procedure.                           After obtaining informed consent, the colonoscope                            was passed under direct vision. Throughout the                             procedure, the patient's blood pressure, pulse, and                            oxygen saturations were monitored continuously. The                            [id number]) scope was introduced through the                            anus and advanced to the the cecum, identified by                            appendiceal orifice and ileocecal valve. The                            colonoscopy was performed without difficulty. The                            patient tolerated the procedure well. The quality                            of the bowel preparation was adequate. The                            colonoscopy was performed without difficulty. The                            patient tolerated the procedure well. The quality                            of the bowel preparation was adequate. Scope In: 11:49:33 AM Scope Out: 12:05:51 PM Scope Withdrawal Time: 0 hours 8 minutes 24 seconds  Total Procedure Duration: 0 hours 16 minutes 18 seconds  Findings:      The perianal and digital rectal examinations were normal.      Scattered small-mouthed diverticula were found in the entire colon.       Status post right and left colon segmental biopsies to further evaluate       her for as to the cause of chronic diarrhea.  A 8 mm polyp was found in the cecum. The polyp was semi-pedunculated.       The polyp was removed with a cold snare. Resection and retrieval were       complete. Estimated blood loss was minimal. Suture present along with       scar in 2 different locations in the rectum consistent with prior       locations of neoplasms. No evidence of recurrent rectal cancer. Anal       papilla seen on retroflexion. Impression:               - Diverticulosis in the entire examined colon.                           - One 8 mm polyp in the cecum, removed with a cold                            snare. Resected and retrieved. Status post                            segmental colon  biopsy                           -Rectal scar and suture present; no evidence of                            recurrent persisting rectal neoplasm Moderate Sedation:      Moderate (conscious) sedation was personally administered by an       anesthesia professional. The following parameters were monitored: oxygen       saturation, heart rate, blood pressure, respiratory rate, EKG, adequacy       of pulmonary ventilation, and response to care. Recommendation:           - Patient has a contact number available for                            emergencies. The signs and symptoms of potential                            delayed complications were discussed with the                            patient. Return to normal activities tomorrow.                            Written discharge instructions were provided to the                            patient.                           - Advance diet as tolerated. Follow-up on                            pathology. Further recommendations to follow. Procedure Code(s):        --- Professional ---  45385, Colonoscopy, flexible; with removal of                            tumor(s), polyp(s), or other lesion(s) by snare                            technique Diagnosis Code(s):        --- Professional ---                           B02.111, Personal history of other malignant                            neoplasm of large intestine                           K63.5, Polyp of colon                           K57.30, Diverticulosis of large intestine without                            perforation or abscess without bleeding CPT copyright 2019 American Medical Association. All rights reserved. The codes documented in this report are preliminary and upon coder review may  be revised to meet current compliance requirements. Cristopher Estimable. Masayuki Sakai, MD Norvel Richards, MD 06/16/2022 12:13:42 PM This report has been signed electronically. Number of  Addenda: 0

## 2022-06-16 NOTE — Transfer of Care (Signed)
Immediate Anesthesia Transfer of Care Note  Patient: Brenda Mclean  Procedure(s) Performed: COLONOSCOPY WITH PROPOFOL  Patient Location: PACU  Anesthesia Type:General  Level of Consciousness: awake, alert  and oriented  Airway & Oxygen Therapy: Patient Spontanous Breathing  Post-op Assessment: Report given to RN, Post -op Vital signs reviewed and stable, Patient moving all extremities X 4 and Patient able to stick tongue midline  Post vital signs: Reviewed  Last Vitals:  Vitals Value Taken Time  BP 134/59   Temp 97.8   Pulse 63   Resp 14   SpO2 95     Last Pain:  Vitals:   06/16/22 1013  TempSrc: Oral  PainSc: 0-No pain      Patients Stated Pain Goal: 6 (37/79/39 6886)  Complications: No notable events documented.

## 2022-06-16 NOTE — Anesthesia Postprocedure Evaluation (Signed)
Anesthesia Post Note  Patient: Brenda Mclean  Procedure(s) Performed: COLONOSCOPY WITH PROPOFOL  Patient location during evaluation: Phase II Anesthesia Type: General Level of consciousness: awake and alert and oriented Pain management: pain level controlled Vital Signs Assessment: post-procedure vital signs reviewed and stable Respiratory status: spontaneous breathing, nonlabored ventilation and respiratory function stable Cardiovascular status: blood pressure returned to baseline and stable Postop Assessment: no apparent nausea or vomiting Anesthetic complications: no   No notable events documented.   Last Vitals:  Vitals:   06/16/22 1013 06/16/22 1211  BP: (!) 152/68 (!) 134/59  Pulse: 73 (!) 54  Resp: 19 16  Temp: 36.5 C 36.6 C  SpO2: 97% 95%    Last Pain:  Vitals:   06/16/22 1211  TempSrc: Oral  PainSc: 0-No pain                 [name] C Madyx Delfin

## 2022-06-16 NOTE — H&P (Signed)
$'@LOGO'K$ @   Primary Care Physician:  Pcp, No Primary Gastroenterologist:  Dr.   Pre-Procedure History & Physical: HPI:  Brenda Mclean is a 76 y.o. female here for surveillance colonoscopy.  History of rectal cancer diagnosed and treated with neoadjuvant chemoradiation 2 years ago proctosigmoidoscopy earlier this year demonstrated no evidence of her cancer.  She is here for surveillance examination of her entire colon.  She does have chronic diarrhea.  Therefore, we will consider taking right and left colon biopsies.  Past Medical History:  Diagnosis Date   Arthritis    COVID-19 03/2019   GERD (gastroesophageal reflux disease)    HLD (hyperlipidemia)    HTN (hypertension)    Port-A-Cath in place 09/09/2020   Rectal adenocarcinoma ([place]) 11/2012   Sigmoid diverticulitis 7262   Uncomplicated    Past Surgical History:  Procedure Laterality Date   BIOPSY  07/10/2020   Procedure: BIOPSY;  Surgeon: [name], MD;  [facility];  Service: Endoscopy;;   [place]  10/30/2012   Dr. Audrie Lia; transanal resection of 2 cm rectal polyp; pathology revealed superficially invasive moderately differentiated adenocarcinoma arising in adenomatous polyp of distal rectum.   COLONOSCOPY  10/24/2013   Dr. West Carbo; with propofol; small granuloma and sutures distal rectum, small polyp mid rectum removed, sigmoid diverticulosis, otherwise normal exam.  No pathology received.  Recommended colonoscopy in 5 years.   COLONOSCOPY  09/08/2012   Dr. West Carbo; with propofol; multi lobulated friable 3 cm mass distal rectum s/p multiple biopsies, scattered sigmoid diverticulosis, otherwise normal exam.  Pathology with adenomatous polyp.   COLONOSCOPY WITH PROPOFOL N/A 07/10/2020   Procedure: COLONOSCOPY WITH PROPOFOL;  Surgeon: [name], MD;  [facility];  Service: Endoscopy;  Laterality: N/A;  10:00am   POLYPECTOMY  07/10/2020   Procedure: POLYPECTOMY;   Surgeon: [name], MD;  [facility];  Service: Endoscopy;;   PORT-A-CATH REMOVAL N/A 11/09/2021   Procedure: MINOR REMOVAL PORT-A-CATH;  Surgeon: Aviva Signs, MD;  Location: [facility];  Service: General;  Laterality: N/A;  pt to arrive at 8:30am   PORTACATH PLACEMENT Left 09/09/2020   Procedure: INSERTION PORT-A-CATH;  Surgeon: Aviva Signs, MD;  Location: [facility];  Service: General;  Laterality: Left;   REPLACEMENT TOTAL HIP W/  RESURFACING IMPLANTS Bilateral    REPLACEMENT TOTAL KNEE Left 07/04/2013   REPLACEMENT TOTAL KNEE Right 12/03/2014   TOTAL SHOULDER ARTHROPLASTY  04/26/2018    Prior to Admission medications   Medication Sig Start Date End Date Taking? Authorizing Provider  MULTIPLE VITAMIN PO Take 1 tablet by mouth daily.   Yes [provider]  Omega-3 Fatty Acids (FISH OIL) 300 MG CAPS Take 300 mg by mouth daily.   Yes [provider]  polyethylene glycol-electrolytes (NULYTELY) 420 g solution As directed 04/27/22  Yes Maris Abascal, Cristopher Estimable, MD  valsartan (DIOVAN) 320 MG tablet Take 320 mg by mouth daily.   Yes [provider]  vitamin B-12 (CYANOCOBALAMIN) 1000 MCG tablet Take 2,000 mcg by mouth daily.   Yes [provider]    Allergies as of 04/27/2022   (No Known Allergies)    Family History  Problem Relation Age of Onset   Arthritis Mother    High blood pressure Mother    Heart disease Father    Breast cancer Maternal Aunt    Stroke Maternal Grandmother    Cancer Paternal Grandmother    Diabetes Brother    Colon cancer  Neg Hx     Social History   Socioeconomic History   Marital status: Widowed    Spouse name: Not on file   Number of children: 2   Years of education: Not on file   Highest education level: Not on file  Occupational History    Employer: retired  Tobacco Use   Smoking status: Never   Smokeless tobacco: Never  Vaping Use   Vaping Use: Never used  Substance and Sexual Activity   Alcohol use:  Never   Drug use: Never   Sexual activity: Not Currently  Other Topics Concern   Not on file  Social History Narrative   Not on file   Social Determinants of Health   Financial Resource Strain: Low Risk  (08/18/2020)   Overall Financial Resource Strain (CARDIA)    Difficulty of Paying Living Expenses: Not hard at all  Food Insecurity: No Food Insecurity (08/18/2020)   Hunger Vital Sign    Worried About Running Out of Food in the Last Year: Never true    [place] in the Last Year: Never true  Transportation Needs: No Transportation Needs (08/18/2020)   PRAPARE - Hydrologist (Medical): No    Lack of Transportation (Non-Medical): No  Physical Activity: Inactive (08/18/2020)   Exercise Vital Sign    Days of Exercise per Week: 0 days    Minutes of Exercise per Session: 0 min  Stress: No Stress Concern Present (08/18/2020)   [place]    Feeling of Stress : Only a little  Social Connections: Moderately Integrated (08/18/2020)   Social Connection and Isolation Panel [NHANES]    Frequency of Communication with Friends and Family: More than three times a week    Frequency of Social Gatherings with Friends and Family: More than three times a week    Attends Religious Services: More than 4 times per year    Active Member of [employer] or Organizations: No    Attends Archivist Meetings: Never    Marital Status: Married  Human resources officer Violence: Not At Risk (08/18/2020)   Humiliation, Afraid, Rape, and Kick questionnaire    Fear of Current or Ex-Partner: No    Emotionally Abused: No    Physically Abused: No    Sexually Abused: No    Review of Systems: See HPI, otherwise negative ROS  Physical Exam: BP (!) 152/68   Pulse 73   Temp 97.7 F (36.5 C) (Oral)   Resp 19   Ht '5\' 1"'$  (1.549 m)   Wt 65.8 kg   SpO2 97%   BMI 27.40 kg/m  General:   Alert,  Well-developed,  well-nourished, pleasant and cooperative in NAD Neck:  Supple; no masses or thyromegaly. No significant cervical adenopathy. Lungs:  Clear throughout to auscultation.   No wheezes, crackles, or rhonchi. No acute distress. Heart:  Regular rate and rhythm; no murmurs, clicks, rubs,  or gallops. Abdomen: Non-distended, normal bowel sounds.  Soft and nontender without appreciable mass or hepatosplenomegaly.  Pulses:  Normal pulses noted. Extremities:  Without clubbing or edema.  Impression/Plan: 76 year old lady with a history of rectal cancer status post neoadjuvant chemo and radiation.  No surgery.  Prior transanal resection of limited stage carcinoma in a polyp 10 years ago.  Chronic diarrhea.  She is here for surveillance colonoscopy.  I have offered this nice lady a surveillance colonoscopy per plan. I have discussed this with her daughter  who is in attendance as well.  The risks, benefits, limitations, alternatives and imponderables have been reviewed with the patient. Questions have been answered. All parties are agreeable.       Notice: This dictation was prepared with Dragon dictation along with smaller phrase technology. Any transcriptional errors that result from this process are unintentional and may not be corrected upon review.

## 2022-06-16 NOTE — Discharge Instructions (Addendum)
  Colonoscopy Discharge Instructions  Read the instructions outlined below and refer to this sheet in the next few weeks. These discharge instructions provide you with general information on caring for yourself after you leave the hospital. Your doctor may also give you specific instructions. While your treatment has been planned according to the most current medical practices available, unavoidable complications occasionally occur. If you have any problems or questions after discharge, call Dr. Gala Romney at [phone]. ACTIVITY You may resume your regular activity, but move at a slower pace for the next 24 hours.  Take frequent rest periods for the next 24 hours.  Walking will help get rid of the air and reduce the bloated feeling in your belly (abdomen).  No driving for 24 hours (because of the medicine (anesthesia) used during the test).   Do not sign any important legal documents or operate any machinery for 24 hours (because of the anesthesia used during the test).  NUTRITION Drink plenty of fluids.  You may resume your normal diet as instructed by your doctor.  Begin with a light meal and progress to your normal diet. Heavy or fried foods are harder to digest and may make you feel sick to your stomach (nauseated).  Avoid alcoholic beverages for 24 hours or as instructed.  MEDICATIONS You may resume your normal medications unless your doctor tells you otherwise.  WHAT YOU CAN EXPECT TODAY Some feelings of bloating in the abdomen.  Passage of more gas than usual.  Spotting of blood in your stool or on the toilet paper.  IF YOU HAD POLYPS REMOVED DURING THE COLONOSCOPY: No aspirin products for 7 days or as instructed.  No alcohol for 7 days or as instructed.  Eat a soft diet for the next 24 hours.  FINDING OUT THE RESULTS OF YOUR TEST Not all test results are available during your visit. If your test results are not back during the visit, make an appointment with your caregiver to find out the  results. Do not assume everything is normal if you have not heard from your caregiver or the medical facility. It is important for you to follow up on all of your test results.  SEEK IMMEDIATE MEDICAL ATTENTION IF: You have more than a spotting of blood in your stool.  Your belly is swollen (abdominal distention).  You are nauseated or vomiting.  You have a temperature over 101.  You have abdominal pain or discomfort that is severe or gets worse throughout the day.    1 polyp removed from your colon; biopsies for diarrhea taken.  Diverticulosis and polyp information provided  I did not see recurrent cancer anywhere.  The recommendations to follow pending review of pathology report.  At patient request, I called Theodosia Quay at [phone] -reviewed findings and recommendations.

## 2022-06-17 ENCOUNTER — Encounter: Payer: Self-pay | Admitting: Internal Medicine

## 2022-06-17 LAB — SURGICAL PATHOLOGY

## 2022-06-22 ENCOUNTER — Encounter (HOSPITAL_COMMUNITY): Payer: Self-pay | Admitting: Internal Medicine

## 2022-09-13 ENCOUNTER — Encounter: Payer: Self-pay | Admitting: *Deleted
